# Patient Record
Sex: Male | Born: 1935
Health system: Southern US, Community
[De-identification: ages and names within clinical notes are randomized; demographics above are authoritative.]

## PROBLEM LIST (undated history)

## (undated) DIAGNOSIS — E119 Type 2 diabetes mellitus without complications: Secondary | ICD-10-CM

## (undated) DIAGNOSIS — Z973 Presence of spectacles and contact lenses: Secondary | ICD-10-CM

## (undated) DIAGNOSIS — Z978 Presence of other specified devices: Secondary | ICD-10-CM

## (undated) DIAGNOSIS — Z8739 Personal history of other diseases of the musculoskeletal system and connective tissue: Secondary | ICD-10-CM

## (undated) DIAGNOSIS — I10 Essential (primary) hypertension: Secondary | ICD-10-CM

## (undated) DIAGNOSIS — R339 Retention of urine, unspecified: Secondary | ICD-10-CM

## (undated) DIAGNOSIS — Z972 Presence of dental prosthetic device (complete) (partial): Secondary | ICD-10-CM

## (undated) DIAGNOSIS — N400 Enlarged prostate without lower urinary tract symptoms: Secondary | ICD-10-CM

## (undated) DIAGNOSIS — E785 Hyperlipidemia, unspecified: Secondary | ICD-10-CM

## (undated) DIAGNOSIS — Z960 Presence of urogenital implants: Secondary | ICD-10-CM

## (undated) DIAGNOSIS — K219 Gastro-esophageal reflux disease without esophagitis: Secondary | ICD-10-CM

## (undated) DIAGNOSIS — E559 Vitamin D deficiency, unspecified: Secondary | ICD-10-CM

## (undated) DIAGNOSIS — J302 Other seasonal allergic rhinitis: Secondary | ICD-10-CM

## (undated) HISTORY — DX: Benign prostatic hyperplasia without lower urinary tract symptoms: N40.0

## (undated) HISTORY — DX: Essential (primary) hypertension: I10

## (undated) HISTORY — PX: TONSILLECTOMY: SUR1361

## (undated) HISTORY — DX: Hyperlipidemia, unspecified: E78.5

## (undated) HISTORY — DX: Vitamin D deficiency, unspecified: E55.9

---

## 1991-02-12 HISTORY — PX: ANTERIOR CERVICAL DECOMP/DISCECTOMY FUSION: SHX1161

## 2004-02-02 ENCOUNTER — Ambulatory Visit: Payer: Self-pay | Admitting: Family Medicine

## 2004-03-15 ENCOUNTER — Ambulatory Visit: Payer: Self-pay | Admitting: Family Medicine

## 2004-05-21 ENCOUNTER — Ambulatory Visit: Payer: Self-pay | Admitting: Family Medicine

## 2004-06-07 ENCOUNTER — Ambulatory Visit: Payer: Self-pay | Admitting: Family Medicine

## 2005-01-24 ENCOUNTER — Ambulatory Visit: Payer: Self-pay | Admitting: Family Medicine

## 2005-04-22 ENCOUNTER — Ambulatory Visit: Payer: Self-pay | Admitting: Family Medicine

## 2005-05-22 ENCOUNTER — Ambulatory Visit: Payer: Self-pay | Admitting: Family Medicine

## 2005-05-27 ENCOUNTER — Ambulatory Visit: Payer: Self-pay | Admitting: Family Medicine

## 2005-11-13 ENCOUNTER — Ambulatory Visit: Payer: Self-pay | Admitting: Family Medicine

## 2006-05-22 ENCOUNTER — Ambulatory Visit: Payer: Self-pay | Admitting: Family Medicine

## 2012-03-20 ENCOUNTER — Ambulatory Visit (INDEPENDENT_AMBULATORY_CARE_PROVIDER_SITE_OTHER): Payer: Medicare PPO | Admitting: Urology

## 2012-03-20 DIAGNOSIS — N401 Enlarged prostate with lower urinary tract symptoms: Secondary | ICD-10-CM

## 2012-03-20 DIAGNOSIS — R81 Glycosuria: Secondary | ICD-10-CM

## 2012-03-20 DIAGNOSIS — N3941 Urge incontinence: Secondary | ICD-10-CM

## 2012-04-28 ENCOUNTER — Other Ambulatory Visit: Payer: Self-pay | Admitting: *Deleted

## 2012-04-28 MED ORDER — AMLODIPINE BESYLATE 10 MG PO TABS: 10.0000 mg | ORAL_TABLET | Freq: Every day | ORAL | Status: DC

## 2012-06-15 ENCOUNTER — Ambulatory Visit (INDEPENDENT_AMBULATORY_CARE_PROVIDER_SITE_OTHER): Payer: Medicare PPO | Admitting: Family Medicine

## 2012-06-15 ENCOUNTER — Encounter: Payer: Self-pay | Admitting: Family Medicine

## 2012-06-15 VITALS — BP 157/64 | HR 81 | Temp 97.0°F | Ht 67.0 in | Wt 207.2 lb

## 2012-06-15 DIAGNOSIS — M21169 Varus deformity, not elsewhere classified, unspecified knee: Secondary | ICD-10-CM | POA: Insufficient documentation

## 2012-06-15 DIAGNOSIS — R739 Hyperglycemia, unspecified: Secondary | ICD-10-CM | POA: Insufficient documentation

## 2012-06-15 DIAGNOSIS — M7989 Other specified soft tissue disorders: Secondary | ICD-10-CM

## 2012-06-15 DIAGNOSIS — R7309 Other abnormal glucose: Secondary | ICD-10-CM

## 2012-06-15 DIAGNOSIS — I10 Essential (primary) hypertension: Secondary | ICD-10-CM | POA: Insufficient documentation

## 2012-06-15 DIAGNOSIS — E785 Hyperlipidemia, unspecified: Secondary | ICD-10-CM | POA: Insufficient documentation

## 2012-06-15 DIAGNOSIS — B0229 Other postherpetic nervous system involvement: Secondary | ICD-10-CM | POA: Insufficient documentation

## 2012-06-15 DIAGNOSIS — N400 Enlarged prostate without lower urinary tract symptoms: Secondary | ICD-10-CM

## 2012-06-15 LAB — COMPLETE METABOLIC PANEL WITH GFR
ALT: 22 U/L (ref 0–53)
AST: 20 U/L (ref 0–37)
Albumin: 4.5 g/dL (ref 3.5–5.2)
Alkaline Phosphatase: 74 U/L (ref 39–117)
BUN: 12 mg/dL (ref 6–23)
CO2: 28 mEq/L (ref 19–32)
Calcium: 9.2 mg/dL (ref 8.4–10.5)
Chloride: 100 mEq/L (ref 96–112)
Creat: 1.05 mg/dL (ref 0.50–1.35)
GFR, Est African American: 80 mL/min
GFR, Est Non African American: 69 mL/min
Glucose, Bld: 133 mg/dL — ABNORMAL HIGH (ref 70–99)
Potassium: 4.5 mEq/L (ref 3.5–5.3)
Sodium: 137 mEq/L (ref 135–145)
Total Bilirubin: 0.5 mg/dL (ref 0.3–1.2)
Total Protein: 7 g/dL (ref 6.0–8.3)

## 2012-06-15 LAB — POCT GLYCOSYLATED HEMOGLOBIN (HGB A1C): Hemoglobin A1C: 5.6

## 2012-06-15 NOTE — Progress Notes (Signed)
Subjective:    Patient ID: Terry Winters, male    DOB: [birth year], 78 y.o.   MRN: [id number]  HPI Came establish for care. Has a painful knot in the distal thigh and swelling and discomfort right leg. Other problems include hyperglycemia, hyperlipidemia, and Hypertension.  denies Headache;denies Chest Pain;denies weakness;denies Shortness of Breath and orthopnea;denies Visual changes;denies palpitations;denies cough; has had right leg pedal edema;denies symptoms of TIA or stroke;deniesClaudication symptoms. admits to Compliance with medications; denies Problems with medications.  No past medical history on file. No past surgical history on file. History   Social History  . Marital Status: Married    Spouse Name: N/A    Number of Children: N/A  . Years of Education: N/A   Occupational History  . Not on file.   Social History Main Topics  . Smoking status: Former Smoker    Types: Cigars    Quit date: 06/15/1972  . Smokeless tobacco: Not on file  . Alcohol Use: Not on file  . Drug Use: Not on file  . Sexually Active: Not on file   Other Topics Concern  . Not on file   Social History Narrative  . No narrative on file   No family history on file. Current Outpatient Prescriptions on File Prior to Visit  Medication Sig Dispense Refill  . amLODipine (NORVASC) 10 MG tablet Take 1 tablet (10 mg total) by mouth daily.  30 tablet  1   No current facility-administered medications on file prior to visit.   Allergies  Allergen Reactions  . Sulfa Antibiotics   . Prednisone Rash    There is no immunization history on file for this patient. Prior to Admission medications   Medication Sig Start Date End Date Taking? Authorizing Provider  amLODipine (NORVASC) 10 MG tablet Take 1 tablet (10 mg total) by mouth daily. 04/28/12  Yes Ileana Ladd, MD  aspirin 81 MG tablet Take 81 mg by mouth daily.   Yes Historical Provider, MD  atorvastatin (LIPITOR) 20 MG tablet  05/18/12  Yes  Historical Provider, MD  cholecalciferol (VITAMIN D) 1000 UNITS tablet Take 2,000 Units by mouth daily.   Yes Historical Provider, MD  zolpidem (AMBIEN) 10 MG tablet  03/28/12  Yes Historical Provider, MD     Review of Systems  Constitutional: Negative.   HENT: Positive for facial swelling.   Eyes: Negative.   Respiratory: Negative.   Cardiovascular: Positive for leg swelling (rt leg ?edema knot on leg no injury).  Gastrointestinal: Negative.   Endocrine: Negative.   Genitourinary: Negative.   Musculoskeletal: Negative.   Neurological: Negative.   Hematological: Negative.   Psychiatric/Behavioral: Negative.        Objective:   Physical Exam APPEARANCE:  Obese WM Patient in no acute distress.The patient appeared well nourished and normally developed. Acyanotic. Waist: VITAL SIGNS:BP 157/64  Pulse 81  Temp(Src) 97 F (36.1 C) (Oral)  Ht 5\' 7"  (1.702 m)  Wt 207 lb 3.2 oz (93.985 kg)  BMI 32.44 kg/m2   SKIN: warm and  Dry without overt rashes, tattoos and scars  HEAD and Neck: without JVD, Head and scalp: normal Eyes:No scleral icterus. Fundi normal, eye movements normal. Ears: Auricle normal, canal normal, Tympanic membranes normal, insufflation normal. Nose: normal Throat: normal Neck & thyroid: normal  CHEST & LUNGS: Chest wall: normal Lungs: Clear  CVS: Reveals the PMI to be normally located. Regular rhythm, First and Second Heart sounds are normal,  absence of murmurs, rubs or gallops. Peripheral vasculature: Radial  pulses: normal Dorsal pedis pulses: normal Posterior pulses: normal  ABDOMEN:  Appearance: normal Benign,, no organomegaly, no masses, no Abdominal Aortic enlargement. No Guarding , no rebound. No Bruits. Bowel sounds: normal  RECTAL: N/A GU: N/A  EXTREMETIES: edematous right leg. Homan's negative Both Femoral and Pedal pulses are normal.  MUSCULOSKELETAL:  Spine: normal Joints: intact Right lateral distal thigh has a mildly  tender cystic swelling.  NEUROLOGIC: oriented to time,place and person; nonfocal. Strength is normal      Assessment & Plan:  HTN (hypertension) - Plan: COMPLETE METABOLIC PANEL WITH GFR  HLD (hyperlipidemia) - Plan: COMPLETE METABOLIC PANEL WITH GFR, NMR Lipoprofile with Lipids  Hyperglycemia - Plan: POCT glycosylated hemoglobin (Hb A1C), COMPLETE METABOLIC PANEL WITH GFR  BPH (benign prostatic hyperplasia)  Postherpetic neuralgia  Leg swelling - Plan: Vas Lab Arterial/Venous  Plan: Orders Placed This Encounter  Procedures  . COMPLETE METABOLIC PANEL WITH GFR  . NMR Lipoprofile with Lipids  . POCT glycosylated hemoglobin (Hb A1C)  . Vas Lab Arterial/Venous    Standing Status: Future     Number of Occurrences:      Standing Expiration Date: 06/15/2013    Scheduling Instructions:     Doppler venous of the right leg. Pain and  Swelling; R/O DVT. To be  Done  Today at [facility].    Order Specific Question:  Where should this test be performed?    Answer:  Other   Meds ordered this encounter  Medications  . atorvastatin (LIPITOR) 20 MG tablet    Sig:   . zolpidem (AMBIEN) 10 MG tablet    Sig:   . aspirin 81 MG tablet    Sig: Take 81 mg by mouth daily.  . cholecalciferol (VITAMIN D) 1000 UNITS tablet    Sig: Take 2,000 Units by mouth daily.    Await labs. Await the US of the right leg       Dr Woodroe Mode Recommendations  Diet and Exercise discussed with patient.  For nutrition information, I recommend books:  1).Eat to Live by Dr Monico Hoar. 2).Prevent and Reverse Heart Disease by Dr Suzzette Righter.  Exercise recommendations are:  If unable to walk, then the patient can exercise in a chair 3 times a day. By flapping arms like a bird gently and raising legs outwards to the front.  If ambulatory, the patient can go for walks for 30 minutes 3 times a week. Then increase the intensity and duration as tolerated.  Goal is to try to attain  exercise frequency to 5 times a week.  If applicable: Best to perform resistance exercises (machines or weights) 2 days a week and cardio type exercises 3 days per week. RTc 4 weeks to check the BP  Francis P. Modesto Charon, M.D.

## 2012-06-15 NOTE — Patient Instructions (Addendum)
      Dr Newman Waren's Recommendations  Diet and Exercise discussed with patient.  For nutrition information, I recommend books:  1).Eat to Live by Dr Joel Fuhrman. 2).Prevent and Reverse Heart Disease by Dr Caldwell Esselstyn.  Exercise recommendations are:  If unable to walk, then the patient can exercise in a chair 3 times a day. By flapping arms like a bird gently and raising legs outwards to the front.  If ambulatory, the patient can go for walks for 30 minutes 3 times a week. Then increase the intensity and duration as tolerated.  Goal is to try to attain exercise frequency to 5 times a week.  If applicable: Best to perform resistance exercises (machines or weights) 2 days a week and cardio type exercises 3 days per week.  

## 2012-06-17 LAB — NMR LIPOPROFILE WITH LIPIDS
Cholesterol, Total: 156 mg/dL (ref <200)
HDL Particle Number: 31.8 umol/L (ref >=30.5)
HDL Size: 8.8 nm — ABNORMAL LOW (ref >=9.2)
HDL-C: 49 mg/dL (ref >=40)
LDL (calc): 73 mg/dL (ref <100)
LDL Particle Number: 1383 nmol/L — ABNORMAL HIGH (ref <1000)
LDL Size: 20.3 nm — ABNORMAL LOW (ref >20.5)
LP-IR Score: 73 — ABNORMAL HIGH (ref <=45)
Large HDL-P: 4.6 umol/L — ABNORMAL LOW (ref >=4.8)
Large VLDL-P: 5.4 nmol/L — ABNORMAL HIGH (ref <=2.7)
Small LDL Particle Number: 768 nmol/L — ABNORMAL HIGH (ref <=527)
Triglycerides: 171 mg/dL — ABNORMAL HIGH (ref <150)
VLDL Size: 56.2 nm — ABNORMAL HIGH (ref <=46.6)

## 2012-06-19 ENCOUNTER — Ambulatory Visit: Payer: Medicare PPO | Admitting: Urology

## 2012-07-16 ENCOUNTER — Encounter: Payer: Self-pay | Admitting: Family Medicine

## 2012-07-16 ENCOUNTER — Ambulatory Visit (INDEPENDENT_AMBULATORY_CARE_PROVIDER_SITE_OTHER): Payer: Medicare PPO | Admitting: Family Medicine

## 2012-07-16 VITALS — BP 158/81 | HR 74 | Temp 97.1°F | Wt 200.8 lb

## 2012-07-16 DIAGNOSIS — Z862 Personal history of diseases of the blood and blood-forming organs and certain disorders involving the immune mechanism: Secondary | ICD-10-CM

## 2012-07-16 DIAGNOSIS — E785 Hyperlipidemia, unspecified: Secondary | ICD-10-CM

## 2012-07-16 DIAGNOSIS — Z8739 Personal history of other diseases of the musculoskeletal system and connective tissue: Secondary | ICD-10-CM | POA: Insufficient documentation

## 2012-07-16 DIAGNOSIS — E663 Overweight: Secondary | ICD-10-CM | POA: Insufficient documentation

## 2012-07-16 DIAGNOSIS — N400 Enlarged prostate without lower urinary tract symptoms: Secondary | ICD-10-CM

## 2012-07-16 DIAGNOSIS — Z8639 Personal history of other endocrine, nutritional and metabolic disease: Secondary | ICD-10-CM

## 2012-07-16 DIAGNOSIS — B0229 Other postherpetic nervous system involvement: Secondary | ICD-10-CM | POA: Insufficient documentation

## 2012-07-16 DIAGNOSIS — I10 Essential (primary) hypertension: Secondary | ICD-10-CM

## 2012-07-16 DIAGNOSIS — L80 Vitiligo: Secondary | ICD-10-CM | POA: Insufficient documentation

## 2012-07-16 MED ORDER — LOSARTAN POTASSIUM 100 MG PO TABS: 100.0000 mg | ORAL_TABLET | Freq: Every day | ORAL | Status: DC

## 2012-07-16 NOTE — Progress Notes (Signed)
Patient ID: Terry Winters, male   DOB: 12/06/1934, 78 y.o.   MRN: [id number] SUBJECTIVE:   HPI:   PMH/PSH: reviewed/updated in Epic  SH/FH: reviewed/updated in Epic  Allergies: reviewed/updated in Epic  Medications: reviewed/updated in Epic  Immunizations: reviewed/updated in Epic  ROS: As above in the HPI. All other systems are stable or negative.  OBJECTIVE: APPEARANCE:  Patient in no acute distress.The patient appeared well nourished and normally developed. Acyanotic. Waist: VITAL SIGNS:BP 158/81  Pulse 74  Temp(Src) 97.1 F (36.2 C) (Oral)  Wt 200 lb 12.8 oz (91.082 kg)  BMI 31.44 kg/m2 WM Obese  SKIN: warm and  Dry without overt rashes, tattoos and scars. Marked vitiligo widespread on the extremities.  HEAD and Neck: without JVD, Head and scalp: normal Eyes:No scleral icterus. Fundi normal, eye movements normal. Ears: Auricle normal, canal normal, Tympanic membranes normal, insufflation normal. Nose: normal Throat: normal Neck & thyroid: normal  CHEST & LUNGS: Chest wall: normal Lungs: Clear  CVS: Reveals the PMI to be normally located. Regular rhythm, First and Second Heart sounds are normal,  absence of murmurs, rubs or gallops. Peripheral vasculature: Radial pulses: normal Dorsal pedis pulses: normal  ABDOMEN:  Appearance: Obese Benign, no organomegaly, no masses, no Abdominal Aortic enlargement. No Guarding , no rebound. No Bruits. Bowel sounds: normal  RECTAL: N/A GU: N/A  EXTREMETIES: nonedematous. Both Pedal pulses are normal.  MUSCULOSKELETAL:  Spine: normal Joints: intact  NEUROLOGIC: oriented to time,place and person; nonfocal. Strength is normal Sensory is normal   Results for orders placed in visit on 06/15/12  COMPLETE METABOLIC PANEL WITH GFR      Result Value Range   Sodium 137  135 - 145 mEq/L   Potassium 4.5  3.5 - 5.3 mEq/L   Chloride 100  96 - 112 mEq/L   CO2 28  19 - 32 mEq/L   Glucose, Bld 133 (*) 70 - 99 mg/dL    BUN 12  6 - 23 mg/dL   Creat 2.95  6.21 - 3.08 mg/dL   Total Bilirubin 0.5  0.3 - 1.2 mg/dL   Alkaline Phosphatase 74  39 - 117 U/L   AST 20  0 - 37 U/L   ALT 22  0 - 53 U/L   Total Protein 7.0  6.0 - 8.3 g/dL   Albumin 4.5  3.5 - 5.2 g/dL   Calcium 9.2  8.4 - 65.7 mg/dL   GFR, Est African American 80     GFR, Est Non African American 69    NMR LIPOPROFILE WITH LIPIDS      Result Value Range   LDL Particle Number [id number] (*) <1000 nmol/L   LDL (calc) 73  <100 mg/dL   HDL-C 49  >=84 mg/dL   Triglycerides 696 (*) <150 mg/dL   Cholesterol, Total 295  <200 mg/dL   HDL Particle Number 28.4  >=13.2 umol/L   Large HDL-P 4.6 (*) >=4.8 umol/L   Large VLDL-P 5.4 (*) <=2.7 nmol/L   Small LDL Particle Number 768 (*) <=527 nmol/L   LDL Size 20.3 (*) >20.5 nm   HDL Size 8.8 (*) >=9.2 nm   VLDL Size 56.2 (*) <=46.6 nm   LP-IR Score 73 (*) <=45  POCT GLYCOSYLATED HEMOGLOBIN (HGB A1C)      Result Value Range   Hemoglobin A1C 5.6%    U/S result from [facility] was negative for DVT.   ASSESSMENT: HTN (hypertension) - Plan: losartan (COZAAR) 100 MG tablet  HLD (hyperlipidemia)  Overweight  Vitiligo  Post herpetic neuralgia  BPH (benign prostatic hyperplasia)  H/O: gout Reviewed labs with patient  PLAN: No orders of the defined types were placed in this encounter.   Meds ordered this encounter  Medications  . fish oil-omega-3 fatty acids 1000 MG capsule    Sig: Take 3 g by mouth daily.  Marland Kitchen losartan (COZAAR) 100 MG tablet    Sig: Take 1 tablet (100 mg total) by mouth daily.    Dispense:  30 tablet    Refill:  3       Dr Woodroe Mode Recommendations  Diet and Exercise discussed with patient.  For nutrition information, I recommend books:  1).Eat to Live by Dr Monico Hoar. 2).Prevent and Reverse Heart Disease by Dr Suzzette Righter. 3) Dr Katherina Right Book: Reversing Diabetes  Exercise recommendations are:  If unable to walk, then the patient can exercise  in a chair 3 times a day. By flapping arms like a bird gently and raising legs outwards to the front.  If ambulatory, the patient can go for walks for 30 minutes 3 times a week. Then increase the intensity and duration as tolerated.  Goal is to try to attain exercise frequency to 5 times a week.  If applicable: Best to perform resistance exercises (machines or weights) 2 days a week and cardio type exercises 3 days per week.   Discussed healthy diet and weight reduction to help with BP control.  Return in about 2 months (around 09/15/2012) for Recheck medical problems. To recheck BP and labs. Patient to monitor BP regularly.  Jametta Moorehead P. Modesto Charon, M.D.

## 2012-07-16 NOTE — Patient Instructions (Signed)
      Dr Kyrstyn Greear's Recommendations  Diet and Exercise discussed with patient.  For nutrition information, I recommend books:  1).Eat to Live by Dr Joel Fuhrman. 2).Prevent and Reverse Heart Disease by Dr Caldwell Esselstyn. 3) Dr Neal Barnard's Book: Reversing Diabetes  Exercise recommendations are:  If unable to walk, then the patient can exercise in a chair 3 times a day. By flapping arms like a bird gently and raising legs outwards to the front.  If ambulatory, the patient can go for walks for 30 minutes 3 times a week. Then increase the intensity and duration as tolerated.  Goal is to try to attain exercise frequency to 5 times a week.  If applicable: Best to perform resistance exercises (machines or weights) 2 days a week and cardio type exercises 3 days per week.  

## 2012-07-24 ENCOUNTER — Encounter: Payer: Self-pay | Admitting: Family Medicine

## 2012-09-08 ENCOUNTER — Encounter: Payer: Self-pay | Admitting: General Practice

## 2012-09-08 ENCOUNTER — Other Ambulatory Visit: Payer: Self-pay | Admitting: Family Medicine

## 2012-09-08 ENCOUNTER — Ambulatory Visit (INDEPENDENT_AMBULATORY_CARE_PROVIDER_SITE_OTHER): Payer: Medicare PPO | Admitting: General Practice

## 2012-09-08 VITALS — BP 148/72 | HR 64 | Temp 96.7°F | Ht 66.5 in | Wt 202.0 lb

## 2012-09-08 DIAGNOSIS — B029 Zoster without complications: Secondary | ICD-10-CM

## 2012-09-08 MED ORDER — VALACYCLOVIR HCL 1 G PO TABS: 1000.0000 mg | ORAL_TABLET | Freq: Three times a day (TID) | ORAL | Status: DC

## 2012-09-08 NOTE — Progress Notes (Signed)
  Subjective:    Patient ID: Terry Winters, male    DOB: 1934/05/08, 78 y.o.   MRN: [id number]  HPI Patient present today with complaints of itchy, burning rash to left lateral chest. He reports noticing this rash on Saturday and gradually worsen. He reports taking neurontin in the past for pain and stopped taking due to constipation. He denies taking OTC medication.     Review of Systems  Constitutional: Negative for fever and chills.  Respiratory: Negative for chest tightness and shortness of breath.   Cardiovascular: Negative for chest pain and palpitations.  Genitourinary: Negative for difficulty urinating.  Skin: Positive for rash.       Red rash to left outer chest  Neurological: Negative for dizziness, weakness and headaches.       Objective:   Physical Exam  Constitutional: He is oriented to person, place, and time. He appears well-developed and well-nourished.  Cardiovascular: Normal rate, regular rhythm and normal heart sounds.   Pulmonary/Chest: Effort normal and breath sounds normal. No respiratory distress. He exhibits no tenderness.  Neurological: He is alert and oriented to person, place, and time.  Skin: Skin is warm and dry. Rash noted. There is erythema.  Two Linear (1/2 inch x 1/4) Maculopapular rashed area with erythematous base. Negative drainage   Psychiatric: He has a normal mood and affect.          Assessment & Plan:  1. Shingles - valACYclovir (VALTREX) 1000 MG tablet; Take 1 tablet (1,000 mg total) by mouth every 8 (eight) hours.  Dispense: 21 tablet; Refill: 0 -keep skin clean and dry -refrain from contact with high risk persons as discussed -cool compresses to soothe itching area -RTO if symptoms worsens or unresolved -Patient verbalized understanding -Coralie Keens, FNP-C

## 2012-09-08 NOTE — Patient Instructions (Addendum)
Shingles Shingles (herpes zoster) is an infection that is caused by the same virus that causes chickenpox (varicella). The infection causes a painful skin rash and fluid-filled blisters, which eventually break open, crust over, and heal. It may occur in any area of the body, but it usually affects only one side of the body or face. The pain of shingles usually lasts about 1 month. However, some people with shingles may develop long-term (chronic) pain in the affected area of the body. Shingles often occurs many years after the person had chickenpox. It is more common:  In people older than 50 years.  In people with weakened immune systems, such as those with HIV, AIDS, or cancer.  In people taking medicines that weaken the immune system, such as transplant medicines.  In people under great stress. CAUSES  Shingles is caused by the varicella zoster virus (VZV), which also causes chickenpox. After a person is infected with the virus, it can remain in the person's body for years in an inactive state (dormant). To cause shingles, the virus reactivates and breaks out as an infection in a nerve root. The virus can be spread from person to person (contagious) through contact with open blisters of the shingles rash. It will only spread to people who have not had chickenpox. When these people are exposed to the virus, they may develop chickenpox. They will not develop shingles. Once the blisters scab over, the person is no longer contagious and cannot spread the virus to others. SYMPTOMS  Shingles shows up in stages. The initial symptoms may be pain, itching, and tingling in an area of the skin. This pain is usually described as burning, stabbing, or throbbing.In a few days or weeks, a painful red rash will appear in the area where the pain, itching, and tingling were felt. The rash is usually on one side of the body in a band or belt-like pattern. Then, the rash usually turns into fluid-filled blisters. They  will scab over and dry up in approximately 2 3 weeks. Flu-like symptoms may also occur with the initial symptoms, the rash, or the blisters. These may include:  Fever.  Chills.  Headache.  Upset stomach. DIAGNOSIS  Your caregiver will perform a skin exam to diagnose shingles. Skin scrapings or fluid samples may also be taken from the blisters. This sample will be examined under a microscope or sent to a lab for further testing. TREATMENT  There is no specific cure for shingles. Your caregiver will likely prescribe medicines to help you manage the pain, recover faster, and avoid long-term problems. This may include antiviral drugs, anti-inflammatory drugs, and pain medicines. HOME CARE INSTRUCTIONS   Take a cool bath or apply cool compresses to the area of the rash or blisters as directed. This may help with the pain and itching.   Only take over-the-counter or prescription medicines as directed by your caregiver.   Rest as directed by your caregiver.  Keep your rash and blisters clean with mild soap and cool water or as directed by your caregiver.  Do not pick your blisters or scratch your rash. Apply an anti-itch cream or numbing creams to the affected area as directed by your caregiver.  Keep your shingles rash covered with a loose bandage (dressing).  Avoid skin contact with:  Babies.   Pregnant women.   Children with eczema.   Elderly people with transplants.   People with chronic illnesses, such as leukemia or AIDS.   Wear loose-fitting clothing to help ease   the pain of material rubbing against the rash.  Keep all follow-up appointments with your caregiver.If the area involved is on your face, you may receive a referral for follow-up to a specialist, such as an eye doctor (ophthalmologist) or an ear, nose, and throat (ENT) doctor. Keeping all follow-up appointments will help you avoid eye complications, chronic pain, or disability.  SEEK IMMEDIATE MEDICAL  CARE IF:   You have facial pain, pain around the eye area, or loss of feeling on one side of your face.  You have ear pain or ringing in your ear.  You have loss of taste.  Your pain is not relieved with prescribed medicines.   Your redness or swelling spreads.   You have more pain and swelling.  Your condition is worsening or has changed.   You have a feveror persistent symptoms for more than 2 3 days.  You have a fever and your symptoms suddenly get worse. MAKE SURE YOU:  Understand these instructions.  Will watch your condition.  Will get help right away if you are not doing well or get worse. Document Released: 01/28/2005 Document Revised: 10/23/2011 Document Reviewed: 09/12/2011 [employer] Patient Information 2014 [employer].  

## 2012-09-15 ENCOUNTER — Ambulatory Visit: Payer: Medicare PPO | Admitting: Family Medicine

## 2012-09-28 ENCOUNTER — Other Ambulatory Visit: Payer: Self-pay | Admitting: *Deleted

## 2012-09-28 DIAGNOSIS — B029 Zoster without complications: Secondary | ICD-10-CM

## 2012-10-15 ENCOUNTER — Ambulatory Visit (INDEPENDENT_AMBULATORY_CARE_PROVIDER_SITE_OTHER): Payer: Medicare PPO | Admitting: Family Medicine

## 2012-10-15 ENCOUNTER — Encounter: Payer: Self-pay | Admitting: Family Medicine

## 2012-10-15 VITALS — BP 164/81 | HR 63 | Temp 97.0°F | Wt 204.2 lb

## 2012-10-15 DIAGNOSIS — M109 Gout, unspecified: Secondary | ICD-10-CM | POA: Insufficient documentation

## 2012-10-15 DIAGNOSIS — M21169 Varus deformity, not elsewhere classified, unspecified knee: Secondary | ICD-10-CM

## 2012-10-15 DIAGNOSIS — R739 Hyperglycemia, unspecified: Secondary | ICD-10-CM

## 2012-10-15 DIAGNOSIS — E663 Overweight: Secondary | ICD-10-CM

## 2012-10-15 DIAGNOSIS — I10 Essential (primary) hypertension: Secondary | ICD-10-CM

## 2012-10-15 DIAGNOSIS — E559 Vitamin D deficiency, unspecified: Secondary | ICD-10-CM | POA: Insufficient documentation

## 2012-10-15 DIAGNOSIS — B0229 Other postherpetic nervous system involvement: Secondary | ICD-10-CM

## 2012-10-15 DIAGNOSIS — N529 Male erectile dysfunction, unspecified: Secondary | ICD-10-CM | POA: Insufficient documentation

## 2012-10-15 DIAGNOSIS — L80 Vitiligo: Secondary | ICD-10-CM

## 2012-10-15 DIAGNOSIS — R7309 Other abnormal glucose: Secondary | ICD-10-CM

## 2012-10-15 DIAGNOSIS — N400 Enlarged prostate without lower urinary tract symptoms: Secondary | ICD-10-CM

## 2012-10-15 DIAGNOSIS — E785 Hyperlipidemia, unspecified: Secondary | ICD-10-CM

## 2012-10-15 LAB — POCT GLYCOSYLATED HEMOGLOBIN (HGB A1C): Hemoglobin A1C: 5.7

## 2012-10-15 MED ORDER — SILDENAFIL CITRATE 20 MG PO TABS: 100.0000 mg | ORAL_TABLET | Freq: Every day | ORAL | Status: DC

## 2012-10-15 NOTE — Progress Notes (Signed)
Patient ID: Terry Winters, male   DOB: 06/20/1934, 78 y.o.   MRN: [id number] SUBJECTIVE: CC: Chief Complaint  Patient presents with  . Follow-up    2 month refill losartan    HPI: Patient is here for follow up of hyperlipidemia/htn/hyperglycemia: denies Headache;denies Chest Pain;denies weakness;denies Shortness of Breath and orthopnea;denies Visual changes;denies palpitations;denies cough;denies pedal edema;denies symptoms of TIA or stroke;deniesClaudication symptoms. admits to Compliance with medications; denies Problems with medications.  Past Medical History  Diagnosis Date  . Hypertension   . Hyperlipidemia    Past Surgical History  Procedure Laterality Date  . Neck surgery     History   Social History  . Marital Status: Married    Spouse Name: N/A    Number of Children: N/A  . Years of Education: N/A   Occupational History  . Not on file.   Social History Main Topics  . Smoking status: Former Smoker    Types: Cigars    Quit date: 06/15/1972  . Smokeless tobacco: Not on file  . Alcohol Use: Not on file  . Drug Use: Not on file  . Sexual Activity: Not on file   Other Topics Concern  . Not on file   Social History Narrative  . No narrative on file   Family History  Problem Relation Age of Onset  . ALS Father    Current Outpatient Prescriptions on File Prior to Visit  Medication Sig Dispense Refill  . aspirin 81 MG tablet Take 81 mg by mouth daily.      Marland Kitchen atorvastatin (LIPITOR) 20 MG tablet TAKE 1 TABLET AT BEDTIME  90 tablet  0  . cholecalciferol (VITAMIN D) 1000 UNITS tablet Take 2,000 Units by mouth daily.      . fish oil-omega-3 fatty acids 1000 MG capsule Take 3 g by mouth daily.      Marland Kitchen losartan (COZAAR) 100 MG tablet Take 1 tablet (100 mg total) by mouth daily.  30 tablet  3  . zolpidem (AMBIEN) 10 MG tablet Take 10 mg by mouth at bedtime as needed.        No current facility-administered medications on file prior to visit.   Allergies  Allergen  Reactions  . Sulfa Antibiotics   . Prednisone Rash    There is no immunization history on file for this patient. Prior to Admission medications   Medication Sig Start Date End Date Taking? Authorizing Provider  aspirin 81 MG tablet Take 81 mg by mouth daily.   Yes Historical Provider, MD  atorvastatin (LIPITOR) 20 MG tablet TAKE 1 TABLET AT BEDTIME 09/08/12  Yes Ileana Ladd, MD  cholecalciferol (VITAMIN D) 1000 UNITS tablet Take 2,000 Units by mouth daily.   Yes Historical Provider, MD  fish oil-omega-3 fatty acids 1000 MG capsule Take 3 g by mouth daily.   Yes Historical Provider, MD  losartan (COZAAR) 100 MG tablet Take 1 tablet (100 mg total) by mouth daily. 07/16/12  Yes Ileana Ladd, MD  zolpidem (AMBIEN) 10 MG tablet Take 10 mg by mouth at bedtime as needed.  03/28/12  Yes Historical Provider, MD     ROS: As above in the HPI. All other systems are stable or negative.  OBJECTIVE: APPEARANCE:  Patient in no acute distress.The patient appeared well nourished and normally developed. Acyanotic. Waist: VITAL SIGNS:BP 164/81  Pulse 63  Temp(Src) 97 F (36.1 C) (Oral)  Wt 204 lb 3.2 oz (92.625 kg)  BMI 32.47 kg/m2  Recheck 122/75  WM Obese  SKIN: warm and  Dry without overt rashes, tattoos and scars  HEAD and Neck: without JVD, Head and scalp: normal Eyes:No scleral icterus. Fundi normal, eye movements normal. Ears: Auricle normal, canal normal, Tympanic membranes normal, insufflation normal. Nose: normal Throat: normal Neck & thyroid: normal  CHEST & LUNGS: Chest wall: normal Lungs: Clear  CVS: Reveals the PMI to be normally located. Regular rhythm, First and Second Heart sounds are normal,  absence of murmurs, rubs or gallops. Peripheral vasculature: Radial pulses: normal Dorsal pedis pulses: normal Posterior pulses: normal  ABDOMEN:  Appearance: normal Benign, no organomegaly, no masses, no Abdominal Aortic enlargement. No Guarding , no rebound. No  Bruits. Bowel sounds: normal  RECTAL: N/A GU: N/A  EXTREMETIES: nonedematous.  MUSCULOSKELETAL:  Spine: normal Joints: intact  NEUROLOGIC: oriented to time,place and person; nonfocal. Strength is normal Sensory is normal Reflexes are normal Cranial Nerves are normal.  ASSESSMENT: Gout  HTN (hypertension) - Plan: CMP14+EGFR  HLD (hyperlipidemia) - Plan: CMP14+EGFR, NMR, lipoprofile  Hyperglycemia - Plan: POCT glycosylated hemoglobin (Hb A1C)  Post herpetic neuralgia  Overweight  Genu varus, unspecified laterality  BPH (benign prostatic hyperplasia)  Erectile dysfunction - Plan: sildenafil (REVATIO) 20 MG tablet  Vitamin D deficiency - Plan: Vit D  25 hydroxy (rtn osteoporosis monitoring)  Vitiligo   PLAN: Plans to change urologist due to insurance.  Orders Placed This Encounter  Procedures  . CMP14+EGFR  . NMR, lipoprofile  . Vit D  25 hydroxy (rtn osteoporosis monitoring)  . POCT glycosylated hemoglobin (Hb A1C)    Meds ordered this encounter  Medications  . DISCONTD: vardenafil (LEVITRA) 20 MG tablet    Sig: Take 20 mg by mouth daily as needed for erectile dysfunction.  . sildenafil (REVATIO) 20 MG tablet    Sig: Take 5 tablets (100 mg total) by mouth daily. As directed prn prior to coitus.    Dispense:  150 tablet    Refill:  3   Diet weight reduction, risk reduction discussed.  Return in about 3 months (around 01/14/2013) for Recheck medical problems.  Adalin Vanderploeg P. Modesto Charon, M.D.

## 2012-10-16 LAB — CMP14+EGFR
ALT: 29 IU/L (ref 0–44)
AST: 26 IU/L (ref 0–40)
Albumin/Globulin Ratio: 2.1 (ref 1.1–2.5)
Albumin: 4.6 g/dL (ref 3.5–4.8)
Alkaline Phosphatase: 66 IU/L (ref 39–117)
BUN/Creatinine Ratio: 12 (ref 10–22)
BUN: 12 mg/dL (ref 8–27)
CO2: 24 mmol/L (ref 18–29)
Calcium: 10.2 mg/dL (ref 8.6–10.2)
Chloride: 99 mmol/L (ref 97–108)
Creatinine, Ser: 1.04 mg/dL (ref 0.76–1.27)
GFR calc Af Amer: 80 mL/min/{1.73_m2} (ref >59)
GFR calc non Af Amer: 69 mL/min/{1.73_m2} (ref >59)
Globulin, Total: 2.2 g/dL (ref 1.5–4.5)
Glucose: 97 mg/dL (ref 65–99)
Potassium: 4.9 mmol/L (ref 3.5–5.2)
Sodium: 140 mmol/L (ref 134–144)
Total Bilirubin: 0.5 mg/dL (ref 0.0–1.2)
Total Protein: 6.8 g/dL (ref 6.0–8.5)

## 2012-10-16 LAB — NMR, LIPOPROFILE
Cholesterol: 144 mg/dL (ref <200)
HDL Cholesterol by NMR: 44 mg/dL (ref >=40)
HDL Particle Number: 32.2 umol/L (ref >=30.5)
LDL Particle Number: 1403 nmol/L — ABNORMAL HIGH (ref <1000)
LDL Size: 19.9 nm — ABNORMAL LOW (ref >20.5)
LDLC SERPL CALC-MCNC: 67 mg/dL (ref <100)
LP-IR Score: 70 — ABNORMAL HIGH (ref <=45)
Small LDL Particle Number: 1064 nmol/L — ABNORMAL HIGH (ref <=527)
Triglycerides by NMR: 166 mg/dL — ABNORMAL HIGH (ref <150)

## 2012-10-16 LAB — VITAMIN D 25 HYDROXY (VIT D DEFICIENCY, FRACTURES): Vit D, 25-Hydroxy: 45.4 ng/mL (ref 30.0–100.0)

## 2012-11-10 ENCOUNTER — Other Ambulatory Visit: Payer: Self-pay | Admitting: Family Medicine

## 2012-12-18 ENCOUNTER — Other Ambulatory Visit: Payer: Self-pay | Admitting: Family Medicine

## 2013-03-19 ENCOUNTER — Other Ambulatory Visit: Payer: Self-pay | Admitting: Family Medicine

## 2013-03-22 NOTE — Telephone Encounter (Signed)
Last seen 10/15/12  FPW  If approved route to nurse to call in to [facility]

## 2013-03-22 NOTE — Telephone Encounter (Signed)
Patient needs to be seen. Patient has exceeded limit since last visit. Refill denied. Bring all medications at next office visit. 

## 2013-04-14 ENCOUNTER — Other Ambulatory Visit: Payer: Self-pay | Admitting: Family Medicine

## 2013-04-15 ENCOUNTER — Other Ambulatory Visit: Payer: Self-pay | Admitting: Family Medicine

## 2013-04-15 NOTE — Telephone Encounter (Signed)
Call patient : Prescription refilled & sent to pharmacy in [facility]. 

## 2013-04-15 NOTE — Telephone Encounter (Signed)
Last seen 10/15/12  FP

## 2013-05-24 ENCOUNTER — Other Ambulatory Visit: Payer: Self-pay | Admitting: Family Medicine

## 2013-05-25 NOTE — Telephone Encounter (Signed)
Patient needs to be seen. Has exceeded time since last visit. Limited quantity refilled. Needs to bring all medications to next appointment.   

## 2013-05-25 NOTE — Telephone Encounter (Signed)
Last seen 09/14 

## 2013-06-28 ENCOUNTER — Telehealth: Payer: Self-pay | Admitting: Family Medicine

## 2013-06-28 MED ORDER — LOSARTAN POTASSIUM 100 MG PO TABS: 100.0000 mg | ORAL_TABLET | Freq: Every day | ORAL | Status: DC

## 2013-06-28 NOTE — Telephone Encounter (Signed)
DONE

## 2013-07-16 ENCOUNTER — Ambulatory Visit (INDEPENDENT_AMBULATORY_CARE_PROVIDER_SITE_OTHER): Payer: Medicare PPO | Admitting: Family

## 2013-07-16 ENCOUNTER — Encounter: Payer: Self-pay | Admitting: Family

## 2013-07-16 VITALS — BP 131/66 | HR 73 | Temp 97.4°F | Ht 66.5 in | Wt 202.0 lb

## 2013-07-16 DIAGNOSIS — I10 Essential (primary) hypertension: Secondary | ICD-10-CM

## 2013-07-16 DIAGNOSIS — Z23 Encounter for immunization: Secondary | ICD-10-CM

## 2013-07-16 DIAGNOSIS — G4700 Insomnia, unspecified: Secondary | ICD-10-CM

## 2013-07-16 DIAGNOSIS — N529 Male erectile dysfunction, unspecified: Secondary | ICD-10-CM

## 2013-07-16 DIAGNOSIS — E559 Vitamin D deficiency, unspecified: Secondary | ICD-10-CM

## 2013-07-16 DIAGNOSIS — E785 Hyperlipidemia, unspecified: Secondary | ICD-10-CM

## 2013-07-16 MED ORDER — ZOLPIDEM TARTRATE 10 MG PO TABS
10.0000 mg | ORAL_TABLET | Freq: Every evening | ORAL | Status: DC | PRN
Start: 2013-07-16 — End: 2015-01-18

## 2013-07-16 MED ORDER — ATORVASTATIN CALCIUM 20 MG PO TABS: ORAL_TABLET | ORAL | Status: DC

## 2013-07-16 MED ORDER — LOSARTAN POTASSIUM 100 MG PO TABS: 100.0000 mg | ORAL_TABLET | Freq: Every day | ORAL | Status: DC

## 2013-07-16 MED ORDER — SILDENAFIL CITRATE 20 MG PO TABS: 100.0000 mg | ORAL_TABLET | Freq: Every day | ORAL | Status: DC

## 2013-07-16 NOTE — Patient Instructions (Signed)

## 2013-07-16 NOTE — Progress Notes (Signed)
Subjective:    Patient ID: Terry Winters, male    DOB: 01/03/1935, 79 y.o.   MRN: [id number]  Hypertension This is a chronic problem. The current episode started more than 1 year ago. The problem is controlled. Pertinent negatives include no anxiety, headaches, palpitations, peripheral edema or shortness of breath. Risk factors for coronary artery disease include dyslipidemia, male gender and obesity. Past treatments include angiotensin blockers. The current treatment provides moderate improvement. There is no history of kidney disease, CAD/MI or a thyroid problem. There is no history of sleep apnea.  Hyperlipidemia This is a chronic problem. The current episode started more than 1 year ago. The problem is controlled. Recent lipid tests were reviewed and are normal. He has no history of diabetes or hypothyroidism. Factors aggravating his hyperlipidemia include fatty foods. Pertinent negatives include no leg pain, myalgias or shortness of breath. Current antihyperlipidemic treatment includes statins. The current treatment provides moderate improvement of lipids. Risk factors for coronary artery disease include male sex, hypertension and dyslipidemia.      Review of Systems  HENT: Negative.   Respiratory: Negative for shortness of breath.   Cardiovascular: Negative for palpitations.  Musculoskeletal: Negative for myalgias.  Skin: Positive for rash.  Neurological: Negative for headaches.  All other systems reviewed and are negative.      Objective:   Physical Exam  Vitals reviewed. Constitutional: He is oriented to person, place, and time. He appears well-developed and well-nourished. No distress.  HENT:  Head: Normocephalic.  Right Ear: External ear normal.  Left Ear: External ear normal.  Mouth/Throat: Oropharynx is clear and moist.  Eyes: Pupils are equal, round, and reactive to light. Right eye exhibits no discharge. Left eye exhibits no discharge.  Neck: Normal range of motion.  Neck supple. No thyromegaly present.  Cardiovascular: Normal rate, regular rhythm, normal heart sounds and intact distal pulses.   No murmur heard. Pulmonary/Chest: Effort normal and breath sounds normal. No respiratory distress. He has no wheezes.  Abdominal: Soft. Bowel sounds are normal. He exhibits no distension. There is no tenderness.  Musculoskeletal: Normal range of motion. He exhibits no edema and no tenderness.  Neurological: He is alert and oriented to person, place, and time. He has normal reflexes. No cranial nerve deficit.  Skin: Skin is warm and dry. No rash noted. No erythema.  Psychiatric: He has a normal mood and affect. His behavior is normal. Judgment and thought content normal.      BP 131/66  Pulse 73  Temp(Src) 97.4 F (36.3 C) (Oral)  Ht 5' 6.5" (1.689 m)  Wt 202 lb (91.627 kg)  BMI 32.12 kg/m2     Assessment & Plan:  1. HTN (hypertension) - losartan (COZAAR) 100 MG tablet; Take 1 tablet (100 mg total) by mouth daily.  Dispense: 90 tablet; Refill: 3 - CMP14+EGFR  2. HLD (hyperlipidemia) - atorvastatin (LIPITOR) 20 MG tablet; TAKE 1 TABLET AT BEDTIME  Dispense: 90 tablet; Refill: 3 - Lipid panel  3. Vitamin D deficiency - Vit D  25 hydroxy (rtn osteoporosis monitoring)  4. Erectile dysfunction - sildenafil (REVATIO) 20 MG tablet; Take 5 tablets (100 mg total) by mouth daily. As directed prn prior to coitus.  Dispense: 150 tablet; Refill: 3  5. Insomnia - zolpidem (AMBIEN) 10 MG tablet; Take 1 tablet (10 mg total) by mouth at bedtime as needed.  Dispense: 30 tablet; Refill: 3   Continue all meds Labs pending Health Maintenance reviewed Diet and exercise encouraged RTO 3 months  [facility]  Lenna Gilford, [place]

## 2013-07-17 LAB — CMP14+EGFR
ALBUMIN: 4.5 g/dL (ref 3.5–4.8)
ALK PHOS: 73 IU/L (ref 39–117)
ALT: 33 IU/L (ref 0–44)
AST: 35 IU/L (ref 0–40)
Albumin/Globulin Ratio: 2 (ref 1.1–2.5)
BUN/Creatinine Ratio: 12 (ref 10–22)
BUN: 13 mg/dL (ref 8–27)
CALCIUM: 9.5 mg/dL (ref 8.6–10.2)
CHLORIDE: 101 mmol/L (ref 97–108)
CO2: 24 mmol/L (ref 18–29)
CREATININE: 1.08 mg/dL (ref 0.76–1.27)
GFR calc Af Amer: 77 mL/min/{1.73_m2} (ref >59)
GFR calc non Af Amer: 66 mL/min/{1.73_m2} (ref >59)
GLOBULIN, TOTAL: 2.3 g/dL (ref 1.5–4.5)
Glucose: 120 mg/dL — ABNORMAL HIGH (ref 65–99)
Potassium: 4.7 mmol/L (ref 3.5–5.2)
Sodium: 140 mmol/L (ref 134–144)
Total Bilirubin: 0.5 mg/dL (ref 0.0–1.2)
Total Protein: 6.8 g/dL (ref 6.0–8.5)

## 2013-07-17 LAB — LIPID PANEL
CHOL/HDL RATIO: 2.8 ratio (ref 0.0–5.0)
Cholesterol, Total: 121 mg/dL (ref 100–199)
HDL: 43 mg/dL (ref >39)
LDL Calculated: 62 mg/dL (ref 0–99)
TRIGLYCERIDES: 81 mg/dL (ref 0–149)
VLDL Cholesterol Cal: 16 mg/dL (ref 5–40)

## 2013-07-17 LAB — VITAMIN D 25 HYDROXY (VIT D DEFICIENCY, FRACTURES): Vit D, 25-Hydroxy: 39.6 ng/mL (ref 30.0–100.0)

## 2013-08-12 ENCOUNTER — Other Ambulatory Visit: Payer: Self-pay

## 2013-08-12 DIAGNOSIS — E785 Hyperlipidemia, unspecified: Secondary | ICD-10-CM

## 2013-08-12 MED ORDER — ATORVASTATIN CALCIUM 20 MG PO TABS: ORAL_TABLET | ORAL | Status: DC

## 2013-09-16 ENCOUNTER — Ambulatory Visit (INDEPENDENT_AMBULATORY_CARE_PROVIDER_SITE_OTHER): Payer: Medicare PPO | Admitting: Nurse Practitioner

## 2013-09-16 ENCOUNTER — Encounter: Payer: Self-pay | Admitting: Nurse Practitioner

## 2013-09-16 VITALS — BP 126/64 | HR 74 | Temp 97.1°F | Ht 66.5 in | Wt 201.8 lb

## 2013-09-16 DIAGNOSIS — L255 Unspecified contact dermatitis due to plants, except food: Secondary | ICD-10-CM

## 2013-09-16 MED ORDER — HYDROXYZINE HCL 25 MG PO TABS: 25.0000 mg | ORAL_TABLET | Freq: Three times a day (TID) | ORAL | Status: DC | PRN

## 2013-09-16 NOTE — Progress Notes (Signed)
   Subjective:    Patient ID: Terry Winters, male    DOB: January 28, 1935, 79 y.o.   MRN: [id number]  HPI Patient has been doing yard work cutting down old vines- noticed a rash the next day- very itchy    Review of Systems  Constitutional: Negative.   HENT: Negative.   Respiratory: Negative.   Cardiovascular: Negative.   Neurological: Negative.   Hematological: Negative.   Psychiatric/Behavioral: Negative.   All other systems reviewed and are negative.      Objective:   Physical Exam  Constitutional: He is oriented to person, place, and time. He appears well-developed and well-nourished.  Cardiovascular: Normal rate, regular rhythm and normal heart sounds.   Pulmonary/Chest: Effort normal and breath sounds normal.  Neurological: He is alert and oriented to person, place, and time.  Skin: Skin is warm.  Erythematous maculo- papular with occasional vesicuar esions scattered on both legs and back   BP 126/64  Pulse 74  Temp(Src) 97.1 F (36.2 C) (Oral)  Ht 5' 6.5" (1.689 m)  Wt 201 lb 12.8 oz (91.536 kg)  BMI 32.09 kg/m2        Assessment & Plan:   1. Contact dermatitis due to plant    Meds ordered this encounter  Medications  . TAMSULOSIN HCL PO    Sig: Take by mouth.  . hydrOXYzine (ATARAX/VISTARIL) 25 MG tablet    Sig: Take 1 tablet (25 mg total) by mouth 3 (three) times daily as needed.    Dispense:  30 tablet    Refill:  0    Order Specific Question:  Supervising Provider    Answer:  Deborra MedinaMOORE, DONALD W [[id number]]   Can't take steroid shot- allergic to prednisone- caused a rash Avoid scratching Calamine lotion if helps Cool compresses RTO prn  Mary-Margaret Daphine DeutscherMartin, FNP

## 2013-09-16 NOTE — Patient Instructions (Signed)

## 2013-12-30 ENCOUNTER — Ambulatory Visit (INDEPENDENT_AMBULATORY_CARE_PROVIDER_SITE_OTHER): Payer: Medicare PPO

## 2013-12-30 DIAGNOSIS — Z23 Encounter for immunization: Secondary | ICD-10-CM

## 2014-04-02 ENCOUNTER — Other Ambulatory Visit: Payer: Self-pay | Admitting: Family Medicine

## 2014-05-09 ENCOUNTER — Other Ambulatory Visit: Payer: Self-pay

## 2014-05-09 ENCOUNTER — Telehealth: Payer: Self-pay | Admitting: Family Medicine

## 2014-05-09 MED ORDER — TAMSULOSIN HCL 0.4 MG PO CAPS: 0.4000 mg | ORAL_CAPSULE | Freq: Every day | ORAL | Status: DC

## 2014-06-03 ENCOUNTER — Ambulatory Visit (INDEPENDENT_AMBULATORY_CARE_PROVIDER_SITE_OTHER): Payer: Medicare PPO | Admitting: Family Medicine

## 2014-06-03 ENCOUNTER — Encounter: Payer: Self-pay | Admitting: Family Medicine

## 2014-06-03 VITALS — BP 150/87 | HR 70 | Temp 97.3°F | Ht 66.5 in | Wt 202.0 lb

## 2014-06-03 DIAGNOSIS — I10 Essential (primary) hypertension: Secondary | ICD-10-CM | POA: Diagnosis not present

## 2014-06-03 DIAGNOSIS — E785 Hyperlipidemia, unspecified: Secondary | ICD-10-CM

## 2014-06-03 DIAGNOSIS — N400 Enlarged prostate without lower urinary tract symptoms: Secondary | ICD-10-CM

## 2014-06-03 NOTE — Progress Notes (Signed)
Subjective:    Patient ID: Terry Winters, male    DOB: [birth year], 80 y.o.   MRN: [id number]  HPI  80 year old gentleman here to follow-up blood pressure and BPH. He takes losartan 100 mg for blood pressure area has shared with him that this may not be a true 24 hour pill and would like to switch him to irbesartan. He is in agreement with this and until he runs out of current prescription will have his losartan and take med twice a day.  The Flomax seems to help but probably not as much as it did initially we talked about increasing the dose from 0.4 mg to 0.8 mg to see if that would be more effective and I think he will try that too. Also suggested taking it with food S that helps absorption and effectiveness of that drug Chief Complaint  Patient presents with  . Hyperlipidemia  . Hypertension  . Benign Prostatic Hypertrophy   Patient Active Problem List   Diagnosis Date Noted  . Erectile dysfunction 10/15/2012  . Gout   . Vitamin D deficiency   . Vitiligo 07/16/2012  . Overweight 07/16/2012  . Post herpetic neuralgia 07/16/2012  . H/O: gout 07/16/2012  . HTN (hypertension) 06/15/2012  . HLD (hyperlipidemia) 06/15/2012  . Hyperglycemia 06/15/2012  . BPH (benign prostatic hyperplasia) 06/15/2012  . Postherpetic neuralgia 06/15/2012  . Genu varus 06/15/2012   Outpatient Encounter Prescriptions as of 06/03/2014  Medication Sig  . aspirin 81 MG tablet Take 81 mg by mouth daily.  Marland Kitchen atorvastatin (LIPITOR) 20 MG tablet TAKE 1 TABLET AT BEDTIME  . cholecalciferol (VITAMIN D) 1000 UNITS tablet Take 2,000 Units by mouth daily.  . fish oil-omega-3 fatty acids 1000 MG capsule Take 3 g by mouth daily.  Marland Kitchen losartan (COZAAR) 100 MG tablet Take 1 tablet (100 mg total) by mouth daily.  . LUTEIN PO Take 1 tablet by mouth.  . Multiple Vitamins-Minerals (ZINC PO) Take 1 tablet by mouth.  . sildenafil (REVATIO) 20 MG tablet Take 5 tablets (100 mg total) by mouth daily. As directed prn prior to  coitus.  . tamsulosin (FLOMAX) 0.4 MG CAPS capsule Take 1 capsule (0.4 mg total) by mouth daily.  Marland Kitchen zolpidem (AMBIEN) 10 MG tablet Take 1 tablet (10 mg total) by mouth at bedtime as needed.  . [DISCONTINUED] hydrOXYzine (ATARAX/VISTARIL) 25 MG tablet Take 1 tablet (25 mg total) by mouth 3 (three) times daily as needed.  . [DISCONTINUED] TAMSULOSIN HCL PO Take by mouth.      Review of Systems  Constitutional: Negative.   HENT: Negative.   Respiratory: Negative.   Cardiovascular: Negative.   Gastrointestinal: Negative.   Genitourinary: Negative.   Neurological: Negative.        Objective:   Physical Exam  Constitutional: He is oriented to person, place, and time. He appears well-developed and well-nourished.  HENT:  Head: Normocephalic.  Cardiovascular: Normal rate and regular rhythm.   Pulmonary/Chest: Effort normal and breath sounds normal.  Neurological: He is alert and oriented to person, place, and time. He has normal reflexes.  Psychiatric: He has a normal mood and affect. His behavior is normal.   Blood pressure 150/87, pulse 70, temperature 97.3 F (36.3 C), temperature source Oral, height 5' 6.5" (1.689 m), weight 202 lb (91.627 kg). s        Assessment & Plan:  1. Essential hypertension As above blood pressure is okay but would like to switch to irbesartan 150 mg when current Rx expires.  2. HLD (hyperlipidemia) Due to check lipids in July. Tolerating statin well  3. BPH (benign prostatic hyperplasia) Continue with generic Flomax but may increase dose as discussed above  Frederica KusterStephen M Miller MD

## 2014-07-22 ENCOUNTER — Other Ambulatory Visit: Payer: Self-pay | Admitting: Family

## 2014-08-01 ENCOUNTER — Encounter: Payer: Self-pay | Admitting: Family Medicine

## 2014-08-01 ENCOUNTER — Ambulatory Visit (INDEPENDENT_AMBULATORY_CARE_PROVIDER_SITE_OTHER): Payer: Medicare PPO | Admitting: Family Medicine

## 2014-08-01 ENCOUNTER — Encounter (INDEPENDENT_AMBULATORY_CARE_PROVIDER_SITE_OTHER): Payer: Self-pay

## 2014-08-01 VITALS — BP 134/73 | HR 66 | Temp 97.2°F | Ht 66.5 in | Wt 197.0 lb

## 2014-08-01 DIAGNOSIS — N400 Enlarged prostate without lower urinary tract symptoms: Secondary | ICD-10-CM | POA: Diagnosis not present

## 2014-08-01 DIAGNOSIS — E785 Hyperlipidemia, unspecified: Secondary | ICD-10-CM | POA: Diagnosis not present

## 2014-08-01 DIAGNOSIS — I10 Essential (primary) hypertension: Secondary | ICD-10-CM | POA: Diagnosis not present

## 2014-08-01 NOTE — Progress Notes (Signed)
   Subjective:    Patient ID: Terry Winters, male    DOB: 1934-09-04, 80 y.o.   MRN: [id number]  HPI 80 year old gentleman here to follow-up hypertension, hyperlipidemia, and borderline diabetes. At his last testing. Glucose was 120. He was advised to restrict carbohydrates and lose weight and he is trying to do that. His wife is a diabetic so generally diet is pretty good. He is taking losartan twice a day and usually remembers to take the second dose. Regards BPH, he has noted less nocturia than before.  Patient Active Problem List   Diagnosis Date Noted  . Erectile dysfunction 10/15/2012  . Gout   . Vitamin D deficiency   . Vitiligo 07/16/2012  . Overweight 07/16/2012  . Post herpetic neuralgia 07/16/2012  . H/O: gout 07/16/2012  . HTN (hypertension) 06/15/2012  . HLD (hyperlipidemia) 06/15/2012  . Hyperglycemia 06/15/2012  . BPH (benign prostatic hyperplasia) 06/15/2012  . Postherpetic neuralgia 06/15/2012  . Genu varus 06/15/2012   Outpatient Encounter Prescriptions as of 08/01/2014  Medication Sig  . aspirin 81 MG tablet Take 81 mg by mouth daily.  Marland Kitchen atorvastatin (LIPITOR) 20 MG tablet TAKE 1 TABLET AT BEDTIME  . cholecalciferol (VITAMIN D) 1000 UNITS tablet Take 2,000 Units by mouth daily.  . fish oil-omega-3 fatty acids 1000 MG capsule Take 3 g by mouth daily.  Marland Kitchen losartan (COZAAR) 100 MG tablet TAKE 1 TABLET (100 MG TOTAL) BY MOUTH DAILY.  Marland Kitchen LUTEIN PO Take 1 tablet by mouth.  . Multiple Vitamins-Minerals (ZINC PO) Take 1 tablet by mouth.  . sildenafil (REVATIO) 20 MG tablet Take 5 tablets (100 mg total) by mouth daily. As directed prn prior to coitus.  . tamsulosin (FLOMAX) 0.4 MG CAPS capsule Take 1 capsule (0.4 mg total) by mouth daily.  Marland Kitchen zolpidem (AMBIEN) 10 MG tablet Take 1 tablet (10 mg total) by mouth at bedtime as needed.   No facility-administered encounter medications on file as of 08/01/2014.      Review of Systems  Constitutional: Negative.   Respiratory:  Negative.   Cardiovascular: Negative.   Gastrointestinal: Negative.   Genitourinary: Negative.   Neurological: Negative.   Psychiatric/Behavioral: Negative.        Objective:   Physical Exam  Constitutional: He is oriented to person, place, and time. He appears well-developed and well-nourished.  Cardiovascular: Normal rate, regular rhythm, normal heart sounds and intact distal pulses.   No Bruits appreciated  Pulmonary/Chest: Effort normal and breath sounds normal.  Neurological: He is alert and oriented to person, place, and time.  Psychiatric: He has a normal mood and affect.    BP 134/73 mmHg  Pulse 66  Temp(Src) 97.2 F (36.2 C) (Oral)  Ht 5' 6.5" (1.689 m)  Wt 197 lb (89.359 kg)  BMI 31.32 kg/m2        Assessment & Plan:  1. Essential hypertension Sugars well controlled on losartan. Continue same dose  2. HLD (hyperlipidemia) Lipids were last checked 1 year ago and LDL was at goal.  3. BPH (benign prostatic hyperplasia) Nocturia has improved. Continue Flomax at current dose

## 2014-08-02 LAB — LIPID PANEL
Chol/HDL Ratio: 3.4 ratio units (ref 0.0–5.0)
Cholesterol, Total: 150 mg/dL (ref 100–199)
HDL: 44 mg/dL (ref >39)
LDL CALC: 84 mg/dL (ref 0–99)
Triglycerides: 108 mg/dL (ref 0–149)
VLDL Cholesterol Cal: 22 mg/dL (ref 5–40)

## 2014-08-02 LAB — PSA, TOTAL AND FREE
PSA, Free Pct: 30.7 %
PSA, Free: 0.86 ng/mL
Prostate Specific Ag, Serum: 2.8 ng/mL (ref 0.0–4.0)

## 2014-08-02 LAB — CMP14+EGFR
ALK PHOS: 69 IU/L (ref 39–117)
ALT: 33 IU/L (ref 0–44)
AST: 32 IU/L (ref 0–40)
Albumin/Globulin Ratio: 1.7 (ref 1.1–2.5)
Albumin: 4.2 g/dL (ref 3.5–4.8)
BUN/Creatinine Ratio: 14 (ref 10–22)
BUN: 13 mg/dL (ref 8–27)
Bilirubin Total: 0.6 mg/dL (ref 0.0–1.2)
CALCIUM: 9.1 mg/dL (ref 8.6–10.2)
CO2: 23 mmol/L (ref 18–29)
Chloride: 99 mmol/L (ref 97–108)
Creatinine, Ser: 0.94 mg/dL (ref 0.76–1.27)
GFR calc Af Amer: 90 mL/min/{1.73_m2} (ref >59)
GFR calc non Af Amer: 78 mL/min/{1.73_m2} (ref >59)
Globulin, Total: 2.5 g/dL (ref 1.5–4.5)
Glucose: 108 mg/dL — ABNORMAL HIGH (ref 65–99)
Potassium: 4.4 mmol/L (ref 3.5–5.2)
SODIUM: 138 mmol/L (ref 134–144)
Total Protein: 6.7 g/dL (ref 6.0–8.5)

## 2014-08-08 ENCOUNTER — Other Ambulatory Visit: Payer: Self-pay | Admitting: Family

## 2014-08-23 ENCOUNTER — Other Ambulatory Visit: Payer: Self-pay | Admitting: Family Medicine

## 2014-10-29 DIAGNOSIS — R21 Rash and other nonspecific skin eruption: Secondary | ICD-10-CM | POA: Diagnosis not present

## 2014-11-02 ENCOUNTER — Ambulatory Visit: Payer: Self-pay | Admitting: Family Medicine

## 2014-11-10 ENCOUNTER — Ambulatory Visit (INDEPENDENT_AMBULATORY_CARE_PROVIDER_SITE_OTHER): Payer: Medicare PPO | Admitting: Family Medicine

## 2014-11-10 ENCOUNTER — Encounter: Payer: Self-pay | Admitting: Family Medicine

## 2014-11-10 VITALS — BP 143/66 | HR 75 | Temp 97.7°F | Ht 66.5 in | Wt 197.2 lb

## 2014-11-10 DIAGNOSIS — Z23 Encounter for immunization: Secondary | ICD-10-CM

## 2014-11-10 DIAGNOSIS — Z0000 Encounter for general adult medical examination without abnormal findings: Secondary | ICD-10-CM

## 2014-11-10 NOTE — Progress Notes (Signed)
Subjective:   Terry Winters is a 80 y.o. male who presents for Medicare Annual/Subsequent preventive examination.  Review of Systems:  Review of Systems  Constitutional: Negative for fever and chills.  HENT: Negative for congestion, ear pain, sore throat and tinnitus.   Eyes: Negative for blurred vision and pain.  Respiratory: Negative for cough, shortness of breath and wheezing.   Cardiovascular: Negative for chest pain, palpitations and leg swelling.  Gastrointestinal: Negative for heartburn, abdominal pain, diarrhea, constipation, blood in stool and melena.  Genitourinary: Negative for dysuria and hematuria.  Musculoskeletal: Negative for myalgias, back pain, joint pain and neck pain.  Skin: Negative for rash.  Neurological: Negative for dizziness, sensory change, focal weakness, weakness and headaches.  Psychiatric/Behavioral: Negative for depression and suicidal ideas.     Cardiac Risk Factors include: advanced age (>85men, >29 women);dyslipidemia;male gender     Objective:    Vitals: BP 143/66 mmHg  Pulse 75  Temp(Src) 97.7 F (36.5 C) (Oral)  Ht 5' 6.5" (1.689 m)  Wt 197 lb 3.2 oz (89.449 kg)  BMI 31.36 kg/m2  Tobacco History  Smoking status  . Former Smoker  . Types: Cigars  . Quit date: 06/15/1972  Smokeless tobacco  . Never Used     Counseling given: Not Answered   Past Medical History  Diagnosis Date  . Hypertension   . Hyperlipidemia   . Gout   . Vitamin D deficiency   . BPH (benign prostatic hypertrophy)    Past Surgical History  Procedure Laterality Date  . Neck surgery     Family History  Problem Relation Age of Onset  . ALS Father    History  Sexual Activity  . Sexual Activity: Not on file    Outpatient Encounter Prescriptions as of 11/10/2014  Medication Sig  . aspirin 81 MG tablet Take 81 mg by mouth daily.  Marland Kitchen atorvastatin (LIPITOR) 20 MG tablet TAKE 1 TABLET AT BEDTIME (MUST BEEN SEEN BEFORE NEXT REFILL)  . cholecalciferol  (VITAMIN D) 1000 UNITS tablet Take 2,000 Units by mouth daily.  . fish oil-omega-3 fatty acids 1000 MG capsule Take 3 g by mouth daily.  Marland Kitchen losartan (COZAAR) 100 MG tablet TAKE 1 TABLET (100 MG TOTAL) BY MOUTH DAILY.  Marland Kitchen LUTEIN PO Take 1 tablet by mouth.  . Multiple Vitamins-Minerals (ZINC PO) Take 1 tablet by mouth.  . tamsulosin (FLOMAX) 0.4 MG CAPS capsule TAKE 1 CAPSULE EVERY DAY  . zolpidem (AMBIEN) 10 MG tablet Take 1 tablet (10 mg total) by mouth at bedtime as needed.  . [DISCONTINUED] sildenafil (REVATIO) 20 MG tablet Take 5 tablets (100 mg total) by mouth daily. As directed prn prior to coitus.   No facility-administered encounter medications on file as of 11/10/2014.    Activities of Daily Living In your present state of health, do you have any difficulty performing the following activities: 11/10/2014  Hearing? N  Vision? N  Difficulty concentrating or making decisions? N  Walking or climbing stairs? N  Dressing or bathing? N  Doing errands, shopping? N  Preparing Food and eating ? N  Using the Toilet? N  In the past six months, have you accidently leaked urine? N  Do you have problems with loss of bowel control? N  Managing your Medications? N  Managing your Finances? N  Housekeeping or managing your Housekeeping? N    Patient Care Team: Junie Spencer, FNP as PCP - General (Nurse Practitioner) Bjorn Pippin, MD as Attending Physician (Urology)  Assessment:    Problem List Items Addressed This Visit    None    Visit Diagnoses    Routine history and physical examination of adult    -  Primary    No major issues the past all screening       Exercise Activities and Dietary recommendations Current Exercise Habits:: Home exercise routine, Type of exercise: walking;Other - see comments (golfing, average 2-3 times per month), Time (Minutes): 25, Frequency (Times/Week): 3, Weekly Exercise (Minutes/Week): 75, Intensity: Moderate  Goals    None     Fall Risk Fall Risk   11/10/2014 08/01/2014 07/16/2013  Falls in the past year? No No Yes  Number falls in past yr: - - 1  Risk for fall due to : - - Other (Comment)  Risk for fall due to (comments): - - Fell on ice on his deck.   Depression Screen PHQ 2/9 Scores 11/10/2014 08/01/2014 07/16/2013  PHQ - 2 Score 0 0 0   Cognitive Testing MMSE - Mini Mental State Exam 11/10/2014  Orientation to time 5  Orientation to Place 5  Registration 3  Attention/ Calculation 5  Recall 3  Language- name 2 objects 2  Language- repeat 1  Language- follow 3 step command 3  Language- read & follow direction 1  Write a sentence 1  Copy design 1  Total score 30    Immunization History  Administered Date(s) Administered  . Influenza,inj,Quad PF,36+ Mos 12/30/2013  . Pneumococcal Polysaccharide-23 07/16/2013   Screening Tests Health Maintenance  Topic Date Due  . ZOSTAVAX  11/10/2015 (Originally 10/05/1996)  . TETANUS/TDAP  11/10/2015 (Originally 10/06/1955)  . PNA vac Low Risk Adult (2 of 2 - PCV13) 11/10/2015 (Originally 07/17/2014)  . INFLUENZA VACCINE  09/12/2015      Plan:    During the course of the visit the patient was educated and counseled about the following appropriate screening and preventive services:   Vaccines to include Pneumoccal, Influenza, Hepatitis B, Td, Zostavax, HCV  Electrocardiogram  Cardiovascular Disease  Colorectal cancer screening  Diabetes screening  Prostate Cancer Screening  Glaucoma screening  Nutrition counseling   Smoking cessation counseling  Patient Instructions (the written plan) was given to the patient.    Nils Pyle, MD  11/10/2014

## 2014-12-03 ENCOUNTER — Other Ambulatory Visit: Payer: Self-pay | Admitting: Family

## 2014-12-05 MED ORDER — TAMSULOSIN HCL 0.4 MG PO CAPS: 0.4000 mg | ORAL_CAPSULE | Freq: Every day | ORAL | Status: DC

## 2014-12-05 MED ORDER — ATORVASTATIN CALCIUM 20 MG PO TABS: ORAL_TABLET | ORAL | Status: DC

## 2014-12-05 NOTE — Telephone Encounter (Signed)
done

## 2015-01-16 ENCOUNTER — Other Ambulatory Visit: Payer: Self-pay | Admitting: Family Medicine

## 2015-01-16 ENCOUNTER — Other Ambulatory Visit: Payer: Self-pay | Admitting: Family

## 2015-01-16 DIAGNOSIS — G4700 Insomnia, unspecified: Secondary | ICD-10-CM

## 2015-01-17 NOTE — Telephone Encounter (Signed)
Request sent to provider. Awaiting response. 

## 2015-01-17 NOTE — Telephone Encounter (Signed)
Last seen 11/10/14  Dr Dettinger  If approved route to nurse to call into [facility]

## 2015-01-18 ENCOUNTER — Other Ambulatory Visit: Payer: Self-pay | Admitting: Family Medicine

## 2015-01-18 NOTE — Telephone Encounter (Signed)
What prescription is he asking for a refill on?

## 2015-01-18 NOTE — Telephone Encounter (Signed)
Last seen 08/01/14  Dr Hyacinth MeekerMiller  If approved route to nurse to call into [facility]

## 2015-01-19 NOTE — Telephone Encounter (Signed)
rx called into pharmacy

## 2015-01-20 ENCOUNTER — Other Ambulatory Visit: Payer: Self-pay | Admitting: Family Medicine

## 2015-02-01 ENCOUNTER — Ambulatory Visit (INDEPENDENT_AMBULATORY_CARE_PROVIDER_SITE_OTHER): Payer: Medicare PPO | Admitting: Family Medicine

## 2015-02-01 ENCOUNTER — Encounter: Payer: Self-pay | Admitting: Family Medicine

## 2015-02-01 VITALS — BP 130/74 | HR 70 | Temp 97.4°F | Ht 66.5 in | Wt 202.2 lb

## 2015-02-01 DIAGNOSIS — N400 Enlarged prostate without lower urinary tract symptoms: Secondary | ICD-10-CM | POA: Diagnosis not present

## 2015-02-01 DIAGNOSIS — E663 Overweight: Secondary | ICD-10-CM

## 2015-02-01 DIAGNOSIS — E785 Hyperlipidemia, unspecified: Secondary | ICD-10-CM

## 2015-02-01 DIAGNOSIS — I10 Essential (primary) hypertension: Secondary | ICD-10-CM

## 2015-02-01 NOTE — Progress Notes (Signed)
   Subjective:    Patient ID: Terry Winters, male    DOB: [birth year], 80 y.o.   MRN: [id number]  HPI  80 year old gentleman who is here to follow-up hypertension, hyperglycemia, and BPH. He really is doing well. He takes losartan for his blood pressure and it appears to be at goal. He no longer takes lipid-lowering drugs but does take Flomax for his enlarged prostate. He requests referral back to [facility] next month to follow-up the problem. He last had lipids checked 6 months ago and they were at goal    Review of Systems  Constitutional: Negative.   Respiratory: Negative.   Cardiovascular: Negative.   Genitourinary: Positive for frequency.  Neurological: Negative.       BP 130/74 mmHg  Pulse 70  Temp(Src) 97.4 F (36.3 C) (Oral)  Ht 5' 6.5" (1.689 m)  Wt 202 lb 4 oz (91.74 kg)  BMI 32.16 kg/m2  Objective:   Physical Exam  Constitutional: He is oriented to person, place, and time. He appears well-developed and well-nourished.  Cardiovascular: Normal rate, regular rhythm, normal heart sounds and intact distal pulses.   Pulmonary/Chest: Effort normal and breath sounds normal.  Musculoskeletal: Normal range of motion.  Neurological: He is alert and oriented to person, place, and time.          Assessment & Plan:  1. Essential hypertension Sugar well controlled on losartan. Continue  2. HLD (hyperlipidemia) Lipids are at goal. I do believe he is taking atorvastatin. I wrote also said he was not taking it in history of present illness  3. Overweight 10 year discuss his weight. Cautioned about weight gain and impact on diabetes blood pressure joints etc.  4. BPH (benign prostatic hyperplasia)  Flomax. Request urology consult to follow-up  Frederica KusterStephen M Miller MD

## 2015-02-04 ENCOUNTER — Other Ambulatory Visit: Payer: Self-pay | Admitting: Family Medicine

## 2015-03-02 ENCOUNTER — Ambulatory Visit (INDEPENDENT_AMBULATORY_CARE_PROVIDER_SITE_OTHER): Payer: Medicare PPO | Admitting: Family Medicine

## 2015-03-02 ENCOUNTER — Encounter: Payer: Self-pay | Admitting: Family Medicine

## 2015-03-02 VITALS — BP 133/73 | HR 100 | Temp 97.3°F | Ht 66.5 in | Wt 198.6 lb

## 2015-03-02 DIAGNOSIS — K2100 Gastro-esophageal reflux disease with esophagitis, without bleeding: Secondary | ICD-10-CM

## 2015-03-02 DIAGNOSIS — K219 Gastro-esophageal reflux disease without esophagitis: Secondary | ICD-10-CM | POA: Insufficient documentation

## 2015-03-02 MED ORDER — OMEPRAZOLE 20 MG PO CPDR
20.0000 mg | DELAYED_RELEASE_CAPSULE | Freq: Every day | ORAL | Status: DC
Start: 2015-03-02 — End: 2016-01-08

## 2015-03-02 NOTE — Progress Notes (Signed)
BP 133/73 mmHg  Pulse 100  Temp(Src) 97.3 F (36.3 C) (Oral)  Ht 5' 6.5" (1.689 m)  Wt 198 lb 9.6 oz (90.084 kg)  BMI 31.58 kg/m2   Subjective:    Patient ID: Terry Winters, male    DOB: 05/14/1934, 81 y.o.   MRN: [id number]  HPI: Terry Winters is a 81 y.o. male presenting on 03/02/2015 for Abdominal Pain and Gastroesophageal Reflux   HPI Abdominal pain Patient has been having epigastric abdominal pain and indigestion for the past several days. The abdominal pain and indigestion feels like what he had previously when he used to have a lot of acid reflux. He did take 1 Zantac and felt better but then has not taken any since. He also has increased early satiety but does not have any association with the pain coming on after feeding.   Relevant past medical, surgical, family and social history reviewed and updated as indicated. Interim medical history since our last visit reviewed. Allergies and medications reviewed and updated.  Review of Systems  Constitutional: Negative for fever.  HENT: Negative for ear discharge and ear pain.   Eyes: Negative for discharge and visual disturbance.  Respiratory: Negative for shortness of breath and wheezing.   Cardiovascular: Negative for chest pain and leg swelling.  Gastrointestinal: Positive for abdominal pain. Negative for vomiting, diarrhea, constipation, blood in stool and abdominal distention.  Genitourinary: Negative for urgency, frequency, hematuria and difficulty urinating.  Musculoskeletal: Negative for back pain and gait problem.  Skin: Negative for rash.  Neurological: Negative for syncope, light-headedness and headaches.  All other systems reviewed and are negative.   Per HPI unless specifically indicated above     Medication List       This list is accurate as of: 03/02/15  3:50 PM.  Always use your most recent med list.               aspirin 81 MG tablet  Take 81 mg by mouth daily.     atorvastatin 20 MG tablet    Commonly known as:  LIPITOR  TAKE 1 TABLET AT BEDTIME     cholecalciferol 1000 units tablet  Commonly known as:  VITAMIN D  Take 2,000 Units by mouth daily.     fish oil-omega-3 fatty acids 1000 MG capsule  Take 3 g by mouth daily.     losartan 100 MG tablet  Commonly known as:  COZAAR  TAKE 1 TABLET (100 MG TOTAL) BY MOUTH DAILY.     losartan 100 MG tablet  Commonly known as:  COZAAR  TAKE 1 TABLET (100 MG TOTAL) BY MOUTH DAILY.     LUTEIN PO  Take 1 tablet by mouth.     omeprazole 20 MG capsule  Commonly known as:  PRILOSEC  Take 1 capsule (20 mg total) by mouth daily.     tamsulosin 0.4 MG Caps capsule  Commonly known as:  FLOMAX  Take 1 capsule (0.4 mg total) by mouth daily.     ZINC PO  Take 1 tablet by mouth.     zolpidem 10 MG tablet  Commonly known as:  AMBIEN  TAKE 1 TAB BY MOUTH AT BEDTIME AS NEEDED           Objective:    BP 133/73 mmHg  Pulse 100  Temp(Src) 97.3 F (36.3 C) (Oral)  Ht 5' 6.5" (1.689 m)  Wt 198 lb 9.6 oz (90.084 kg)  BMI 31.58 kg/m2  Wt Readings from Last 3 Encounters:  03/02/15 198 lb 9.6 oz (90.084 kg)  02/01/15 202 lb 4 oz (91.74 kg)  11/10/14 197 lb 3.2 oz (89.449 kg)    Physical Exam  Constitutional: He is oriented to person, place, and time. He appears well-developed and well-nourished. No distress.  Eyes: Conjunctivae and EOM are normal. Pupils are equal, round, and reactive to light. Right eye exhibits no discharge. No scleral icterus.  Cardiovascular: Normal rate, regular rhythm, normal heart sounds and intact distal pulses.   No murmur heard. Pulmonary/Chest: Effort normal and breath sounds normal. No respiratory distress. He has no wheezes.  Abdominal: Soft. Bowel sounds are normal. He exhibits no distension and no mass. There is tenderness in the epigastric area. There is no rebound, no guarding and no CVA tenderness.  Musculoskeletal: Normal range of motion. He exhibits no edema.  Neurological: He is alert and  oriented to person, place, and time. Coordination normal.  Skin: Skin is warm and dry. No rash noted. He is not diaphoretic.  Psychiatric: He has a normal mood and affect. His behavior is normal.  Vitals reviewed.      Assessment & Plan:   Problem List Items Addressed This Visit      Digestive   GERD (gastroesophageal reflux disease) - Primary   Relevant Medications   omeprazole (PRILOSEC) 20 MG capsule       Follow up plan: Return if symptoms worsen or fail to improve.  Counseling provided for all of the vaccine components No orders of the defined types were placed in this encounter.    Arville Care, MD [facility] 03/02/2015, 3:50 PM

## 2015-03-17 ENCOUNTER — Other Ambulatory Visit: Payer: Self-pay | Admitting: Family Medicine

## 2015-04-20 DIAGNOSIS — H2513 Age-related nuclear cataract, bilateral: Secondary | ICD-10-CM | POA: Diagnosis not present

## 2015-05-31 ENCOUNTER — Ambulatory Visit (INDEPENDENT_AMBULATORY_CARE_PROVIDER_SITE_OTHER): Payer: Medicare PPO | Admitting: Family Medicine

## 2015-05-31 DIAGNOSIS — L03114 Cellulitis of left upper limb: Secondary | ICD-10-CM

## 2015-05-31 MED ORDER — AMOXICILLIN-POT CLAVULANATE 875-125 MG PO TABS: 1.0000 | ORAL_TABLET | Freq: Two times a day (BID) | ORAL | Status: DC

## 2015-05-31 NOTE — Progress Notes (Signed)
Subjective:  Patient ID: Samantha Olivera, male    DOB: [birth year]  Age: 81 y.o. 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 The next day, 4 days ago, he noticed swelling in the left hand. This has progressed up the hand and forearm with swelling and erythema. It is moderately painful. Concerned about spider bite.   History Sylis has a past medical history of Hypertension; Hyperlipidemia; Gout; Vitamin D deficiency; and BPH (benign prostatic hypertrophy).   He has past surgical history that includes Neck surgery.   His family history includes ALS in his father.He reports that he quit smoking about 42 years ago. His smoking use included Cigars. He has never used smokeless tobacco. He reports that he does not drink alcohol or use illicit drugs.    ROS Review of Systems  Constitutional: Negative for fever, chills and diaphoresis.  HENT: Negative for rhinorrhea and sore throat.   Respiratory: Negative for cough and shortness of breath.   Gastrointestinal: Negative for abdominal pain.  Musculoskeletal: Negative for myalgias.  Skin: Negative for rash.  Neurological: Negative for weakness and headaches.    Objective:  There were no vitals taken for this visit.  BP Readings from Last 3 Encounters:  03/02/15 133/73  02/01/15 130/74  11/10/14 143/66    Wt Readings from Last 3 Encounters:  03/02/15 198 lb 9.6 oz (90.084 kg)  02/01/15 202 lb 4 oz (91.74 kg)  11/10/14 197 lb 3.2 oz (89.449 kg)     Physical Exam  Constitutional: He is oriented to person, place, and time. He appears well-developed and well-nourished.  HENT:  Head: Normocephalic and atraumatic.  Right Ear: Tympanic membrane and external ear normal. No decreased hearing is noted.  Left Ear: Tympanic membrane and external ear normal. No decreased hearing is noted.  Mouth/Throat: No oropharyngeal exudate or posterior oropharyngeal erythema.  Eyes:  Pupils are equal, round, and reactive to light.  Neck: Normal range of motion. Neck supple.  Cardiovascular: Normal rate and regular rhythm.   No murmur heard. Pulmonary/Chest: Breath sounds normal. No respiratory distress.  Abdominal: Soft. Bowel sounds are normal. He exhibits no mass. There is no tenderness.  Neurological: He is alert and oriented to person, place, and time.  Skin:  There is erythema and 2+  Edema extending from the PIPs dorsally up to the mid 1/3 of the forearm.  Vitals reviewed.    Lab Results  Component Value Date   GLUCOSE 108* 08/01/2014   CHOL 150 08/01/2014   TRIG 108 08/01/2014   HDL 44 08/01/2014   LDLCALC 84 08/01/2014   ALT 33 08/01/2014   AST 32 08/01/2014   NA 138 08/01/2014   K 4.4 08/01/2014   CL 99 08/01/2014   CREATININE 0.94 08/01/2014   BUN 13 08/01/2014   CO2 23 08/01/2014   HGBA1C 5.7 10/15/2012    No results found.  Assessment & Plan:   Jamorian was seen today for left hand swollen.  Diagnoses and all orders for this visit:  Cellulitis of hand, left  Other orders -     Discontinue: amoxicillin-clavulanate (AUGMENTIN) 875-125 MG tablet; Take 1 tablet by mouth 2 (two) times daily. Take all of this medication -     amoxicillin-clavulanate (AUGMENTIN) 875-125 MG tablet; Take 1 tablet by mouth 2 (two) times daily. Take all of this medication    I am having Mr. Darnell maintain his aspirin, cholecalciferol, fish oil-omega-3  fatty acids, Multiple Vitamins-Minerals (ZINC PO), LUTEIN PO, tamsulosin, zolpidem, losartan, losartan, omeprazole, atorvastatin, and amoxicillin-clavulanate.  Meds ordered this encounter  Medications  . DISCONTD: amoxicillin-clavulanate (AUGMENTIN) 875-125 MG tablet    Sig: Take 1 tablet by mouth 2 (two) times daily. Take all of this medication    Dispense:  20 tablet    Refill:  0  . amoxicillin-clavulanate (AUGMENTIN) 875-125 MG tablet    Sig: Take 1 tablet by mouth 2 (two) times daily. Take all of this  medication    Dispense:  20 tablet    Refill:  0     Follow-up: Return if symptoms worsen or fail to improve.  Mechele ClaudeWarren Treesa Mccully, M.D.

## 2015-07-14 ENCOUNTER — Telehealth: Payer: Self-pay | Admitting: Family

## 2015-07-14 ENCOUNTER — Other Ambulatory Visit: Payer: Self-pay | Admitting: *Deleted

## 2015-07-14 MED ORDER — TAMSULOSIN HCL 0.4 MG PO CAPS: 0.4000 mg | ORAL_CAPSULE | Freq: Every day | ORAL | Status: DC

## 2015-07-14 MED ORDER — ATORVASTATIN CALCIUM 20 MG PO TABS: 20.0000 mg | ORAL_TABLET | Freq: Every day | ORAL | Status: DC

## 2015-08-12 ENCOUNTER — Other Ambulatory Visit: Payer: Self-pay | Admitting: Family Medicine

## 2015-08-14 NOTE — Telephone Encounter (Signed)
Last seen 03/02/15 Dr Louanne Skyeettinger   Neysa Bonitohristy  PCP   Requesting 90 day supply

## 2015-10-17 ENCOUNTER — Other Ambulatory Visit: Payer: Self-pay | Admitting: Family Medicine

## 2015-10-17 NOTE — Telephone Encounter (Signed)
Last refill without being seen 

## 2015-10-24 ENCOUNTER — Other Ambulatory Visit: Payer: Self-pay | Admitting: Family Medicine

## 2015-11-06 ENCOUNTER — Telehealth: Payer: Self-pay | Admitting: Family

## 2015-12-02 ENCOUNTER — Other Ambulatory Visit: Payer: Self-pay | Admitting: Family

## 2015-12-04 NOTE — Telephone Encounter (Signed)
Patient NTBS for follow up and lab work  

## 2015-12-04 NOTE — Telephone Encounter (Signed)
Patient aware, script was sent in but he must have an office visit and lab work before further refills.

## 2016-01-08 ENCOUNTER — Ambulatory Visit (INDEPENDENT_AMBULATORY_CARE_PROVIDER_SITE_OTHER): Payer: Medicare PPO | Admitting: Family Medicine

## 2016-01-08 ENCOUNTER — Encounter: Payer: Self-pay | Admitting: Family Medicine

## 2016-01-08 VITALS — BP 117/73 | HR 96 | Temp 96.7°F | Ht 66.5 in | Wt 204.0 lb

## 2016-01-08 DIAGNOSIS — Z23 Encounter for immunization: Secondary | ICD-10-CM | POA: Diagnosis not present

## 2016-01-08 DIAGNOSIS — I10 Essential (primary) hypertension: Secondary | ICD-10-CM | POA: Diagnosis not present

## 2016-01-08 DIAGNOSIS — N400 Enlarged prostate without lower urinary tract symptoms: Secondary | ICD-10-CM | POA: Diagnosis not present

## 2016-01-08 DIAGNOSIS — E782 Mixed hyperlipidemia: Secondary | ICD-10-CM | POA: Diagnosis not present

## 2016-01-08 DIAGNOSIS — G4700 Insomnia, unspecified: Secondary | ICD-10-CM

## 2016-01-08 MED ORDER — ZOLPIDEM TARTRATE 10 MG PO TABS: 10.0000 mg | ORAL_TABLET | Freq: Every evening | ORAL | 1 refills | Status: DC | PRN

## 2016-01-08 NOTE — Progress Notes (Signed)
BP 117/73   Pulse 96   Temp (!) 96.7 F (35.9 C) (Oral)   Ht 5' 6.5" (1.689 m)   Wt 204 lb (92.5 kg)   BMI 32.43 kg/m    Subjective:    Patient ID: Terry Winters, male    DOB: Feb 03, 1935, 81 y.o.   MRN: [id number]  HPI: Terry Winters is a 81 y.o. male presenting on 01/08/2016 for Annual Exam   HPI Hypertension recheck Patient is coming in for a hypertension recheck. His blood pressure today is 117/73. He is currently on losartan. Patient denies headaches, blurred vision, chest pains, shortness of breath, or weakness. Denies any side effects from medication and is content with current medication.   BPH and hyperlipidemia lab recheck Patient is coming in for lab recheck for his BPH and hyperlipidemia. He is currently on Flomax and Lipitor and they are doing well for him. He denies any side effects from his medications. We will check labs for both of these.  Insomnia Patient is coming in for refill on his Ambien for insomnia. He does use it every night but tries to use a half a tablet most of the time instead of using a full tablet.  Relevant past medical, surgical, family and social history reviewed and updated as indicated. Interim medical history since our last visit reviewed. Allergies and medications reviewed and updated.  Review of Systems  Constitutional: Negative for chills and fever.  Eyes: Negative for discharge.  Respiratory: Negative for shortness of breath and wheezing.   Cardiovascular: Negative for chest pain and leg swelling.  Genitourinary: Positive for frequency.  Musculoskeletal: Negative for back pain and gait problem.  Skin: Negative for rash.  Neurological: Negative for dizziness, light-headedness, numbness and headaches.  Psychiatric/Behavioral: Positive for sleep disturbance. Negative for decreased concentration, dysphoric mood, self-injury and suicidal ideas. The patient is not nervous/anxious.   All other systems reviewed and are negative.   Per HPI  unless specifically indicated above     Medication List       Accurate as of 01/08/16 11:06 AM. Always use your most recent med list.          aspirin 81 MG tablet Take 81 mg by mouth daily.   atorvastatin 20 MG tablet Commonly known as:  LIPITOR TAKE 1 TABLET AT BEDTIME (NEED MD APPOINTMENT FOR FURTHER REFILLS)   cholecalciferol 1000 units tablet Commonly known as:  VITAMIN D Take 2,000 Units by mouth daily.   fish oil-omega-3 fatty acids 1000 MG capsule Take 3 g by mouth daily.   losartan 100 MG tablet Commonly known as:  COZAAR TAKE 1 TABLET (100 MG TOTAL) BY MOUTH DAILY.   LUTEIN PO Take 1 tablet by mouth.   tamsulosin 0.4 MG Caps capsule Commonly known as:  FLOMAX TAKE 1 CAPSULE (0.4 MG TOTAL) BY MOUTH DAILY.   ZINC PO Take 1 tablet by mouth.   zolpidem 10 MG tablet Commonly known as:  AMBIEN Take 1 tablet (10 mg total) by mouth at bedtime as needed for sleep.          Objective:    BP 117/73   Pulse 96   Temp (!) 96.7 F (35.9 C) (Oral)   Ht 5' 6.5" (1.689 m)   Wt 204 lb (92.5 kg)   BMI 32.43 kg/m   Wt Readings from Last 3 Encounters:  01/08/16 204 lb (92.5 kg)  03/02/15 198 lb 9.6 oz (90.1 kg)  02/01/15 202 lb 4 oz (91.7 kg)  Physical Exam  Constitutional: He is oriented to person, place, and time. He appears well-developed and well-nourished. No distress.  Eyes: Conjunctivae are normal. Right eye exhibits no discharge. No scleral icterus.  Cardiovascular: Normal rate, regular rhythm, normal heart sounds and intact distal pulses.   No murmur heard. Pulmonary/Chest: Effort normal and breath sounds normal. No respiratory distress. He has no wheezes. He has no rales.  Musculoskeletal: Normal range of motion. He exhibits no edema.  Neurological: He is alert and oriented to person, place, and time. Coordination normal.  Skin: Skin is warm and dry. No rash noted. He is not diaphoretic.  Psychiatric: He has a normal mood and affect. His  behavior is normal.  Nursing note and vitals reviewed.     Assessment & Plan:   Problem List Items Addressed This Visit      Cardiovascular and Mediastinum   HTN (hypertension) - Primary   Relevant Orders   CMP14+EGFR     Genitourinary   BPH (benign prostatic hyperplasia)   Relevant Orders   PSA, total and free   Ambulatory referral to Urology     Other   HLD (hyperlipidemia)   Relevant Orders   Lipid panel    Other Visit Diagnoses    Insomnia, unspecified type       Relevant Medications   zolpidem (AMBIEN) 10 MG tablet       Follow up plan: Return in about 6 months (around 07/07/2016), or if symptoms worsen or fail to improve, for Hypertension and cholesterol recheck.  Counseling provided for all of the vaccine components Orders Placed This Encounter  Procedures  . CMP14+EGFR  . Lipid panel  . PSA, total and free  . Ambulatory referral to Urology    Caryl Pina, MD [facility] 01/08/2016, 11:06 AM

## 2016-01-18 ENCOUNTER — Other Ambulatory Visit: Payer: Self-pay | Admitting: Family Medicine

## 2016-03-01 ENCOUNTER — Other Ambulatory Visit: Payer: Self-pay | Admitting: Family

## 2016-03-02 MED ORDER — ATORVASTATIN CALCIUM 20 MG PO TABS: 20.0000 mg | ORAL_TABLET | Freq: Every day | ORAL | 0 refills | Status: DC

## 2016-03-02 NOTE — Telephone Encounter (Signed)
done

## 2016-03-03 ENCOUNTER — Other Ambulatory Visit: Payer: Self-pay | Admitting: Family

## 2016-03-04 ENCOUNTER — Telehealth: Payer: Self-pay | Admitting: Family

## 2016-03-05 NOTE — Telephone Encounter (Signed)
Both have been done, LVM

## 2016-03-08 ENCOUNTER — Ambulatory Visit (INDEPENDENT_AMBULATORY_CARE_PROVIDER_SITE_OTHER): Payer: Medicare PPO | Admitting: Urology

## 2016-03-08 ENCOUNTER — Other Ambulatory Visit (HOSPITAL_COMMUNITY)
Admission: RE | Admit: 2016-03-08 | Discharge: 2016-03-08 | Disposition: A | Discharge status: Home / Self Care | Payer: Medicare PPO | Source: Other Acute Inpatient Hospital | Attending: Urology | Admitting: Urology

## 2016-03-08 DIAGNOSIS — N3941 Urge incontinence: Secondary | ICD-10-CM | POA: Diagnosis not present

## 2016-03-08 DIAGNOSIS — R3121 Asymptomatic microscopic hematuria: Secondary | ICD-10-CM

## 2016-03-08 DIAGNOSIS — R3912 Poor urinary stream: Secondary | ICD-10-CM | POA: Diagnosis not present

## 2016-03-08 DIAGNOSIS — N401 Enlarged prostate with lower urinary tract symptoms: Secondary | ICD-10-CM | POA: Diagnosis not present

## 2016-03-08 LAB — URINALYSIS, COMPLETE (UACMP) WITH MICROSCOPIC
Bacteria, UA: NONE SEEN
Bilirubin Urine: NEGATIVE
Glucose, UA: 50 mg/dL — AB
Hgb urine dipstick: NEGATIVE
Ketones, ur: NEGATIVE mg/dL
Leukocytes, UA: NEGATIVE
Nitrite: NEGATIVE
PROTEIN: NEGATIVE mg/dL
Specific Gravity, Urine: 1.018 (ref 1.005–1.030)
pH: 5 (ref 5.0–8.0)

## 2016-05-16 ENCOUNTER — Telehealth: Payer: Self-pay | Admitting: Family

## 2016-05-16 NOTE — Telephone Encounter (Signed)
Pt advised he needs to call his urologist since Dr Margo Aye was the one who gave him this rx and pt voiced understanding.

## 2016-05-20 ENCOUNTER — Other Ambulatory Visit: Payer: Self-pay | Admitting: Family Medicine

## 2016-05-20 MED ORDER — TAMSULOSIN HCL 0.4 MG PO CAPS: 0.4000 mg | ORAL_CAPSULE | Freq: Every day | ORAL | 0 refills | Status: DC

## 2016-05-20 NOTE — Telephone Encounter (Signed)
Returned patient's phone call.  Patient states that he no longer needs this medication.  He received his mail order today.  Patient will call to cancel Rx sent to [facility]

## 2016-05-31 ENCOUNTER — Encounter: Payer: Self-pay | Admitting: Family Medicine

## 2016-05-31 ENCOUNTER — Ambulatory Visit (INDEPENDENT_AMBULATORY_CARE_PROVIDER_SITE_OTHER): Payer: Medicare PPO | Admitting: Family Medicine

## 2016-05-31 VITALS — BP 131/78 | HR 81 | Temp 96.9°F | Resp 20 | Ht 66.5 in | Wt 203.2 lb

## 2016-05-31 DIAGNOSIS — J209 Acute bronchitis, unspecified: Secondary | ICD-10-CM | POA: Diagnosis not present

## 2016-05-31 MED ORDER — AZITHROMYCIN 250 MG PO TABS: ORAL_TABLET | ORAL | 0 refills | Status: DC

## 2016-05-31 NOTE — Progress Notes (Signed)
   HPI  Patient presents today here with cough.  Patient explains he has had 2 to have weeks of cough and congestion. He felt at first it was due to allergies and started taking antihistamine daily. He is not sure which brand, however it sounds similar to Claritin. Benadryl causes sedation for him.  He has had severe persistent cough, this has led to some headaches. Patient states that overall he feels somewhat stable to maybe a little bit better, however is concerned that it's lingering on so long.  He is tolerating food and fluids like usual. No shortness of breath. No facial pain or pressure. Using nasal saline rinses for congestion  PMH: Smoking status noted ROS: Per HPI  Objective: BP 131/78   Pulse 81   Temp (!) 96.9 F (36.1 C) (Oral)   Resp 20   Ht 5' 6.5" (1.689 m)   Wt 203 lb 3.2 oz (92.2 kg)   SpO2 97%   BMI 32.31 kg/m  Gen: NAD, alert, cooperative with exam HEENT: NCAT, nares with swollen turbinates and boggy mucosa bilaterally, TMs normal bilaterally, oropharynx with some cobblestoning CV: RRR, good S1/S2, no murmur Resp: CTABL, no wheezes, non-labored Ext: No edema, warm Neuro: Alert and oriented, No gross deficits  Assessment and plan:  # Acute bronchitis Considering course of illness I think it's most prudent to go ahead and cover with azithromycin. I did emphasize the importance of taking a daily long-acting antihistamine. Return to clinic with any concerns or worsening symptoms.    Meds ordered this encounter  Medications  . azithromycin (ZITHROMAX) 250 MG tablet    Sig: Take 2 tablets on day 1 and 1 tablet daily after that    Dispense:  6 tablet    Refill:  0    Murtis Sink, MD [facility] 05/31/2016, 6:01 PM

## 2016-05-31 NOTE — Patient Instructions (Signed)
Great to see you!  Take all antibiotics  Also continue taking the antihistamine. I usually recommend taking zyrtec (cetirizine), Claritin (loratidine), or Allegra (fexofenadine) 1 pill once daily.    Acute Bronchitis, Adult Acute bronchitis is when air tubes (bronchi) in the lungs suddenly get swollen. The condition can make it hard to breathe. It can also cause these symptoms:  A cough.  Coughing up clear, yellow, or green mucus.  Wheezing.  Chest congestion.  Shortness of breath.  A fever.  Body aches.  Chills.  A sore throat. Follow these instructions at home: Medicines   Take over-the-counter and prescription medicines only as told by your doctor.  If you were prescribed an antibiotic medicine, take it as told by your doctor. Do not stop taking the antibiotic even if you start to feel better. General instructions   Rest.  Drink enough fluids to keep your pee (urine) clear or pale yellow.  Avoid smoking and secondhand smoke. If you smoke and you need help quitting, ask your doctor. Quitting will help your lungs heal faster.  Use an inhaler, cool mist vaporizer, or humidifier as told by your doctor.  Keep all follow-up visits as told by your doctor. This is important. How is this prevented? To lower your risk of getting this condition again:  Wash your hands often with soap and water. If you cannot use soap and water, use hand sanitizer.  Avoid contact with people who have cold symptoms.  Try not to touch your hands to your mouth, nose, or eyes.  Make sure to get the flu shot every year. Contact a doctor if:  Your symptoms do not get better in 2 weeks. Get help right away if:  You cough up blood.  You have chest pain.  You have very bad shortness of breath.  You become dehydrated.  You faint (pass out) or keep feeling like you are going to pass out.  You keep throwing up (vomiting).  You have a very bad headache.  Your fever or chills gets  worse. This information is not intended to replace advice given to you by your health care provider. Make sure you discuss any questions you have with your health care provider. Document Released: 07/17/2007 Document Revised: 09/06/2015 Document Reviewed: 07/19/2015 Elsevier Interactive Patient Education  2017 [employer].

## 2016-06-01 ENCOUNTER — Other Ambulatory Visit: Payer: Self-pay | Admitting: Family Medicine

## 2016-06-14 ENCOUNTER — Ambulatory Visit (INDEPENDENT_AMBULATORY_CARE_PROVIDER_SITE_OTHER): Payer: Medicare PPO | Admitting: Family

## 2016-06-14 ENCOUNTER — Encounter: Payer: Self-pay | Admitting: Family

## 2016-06-14 VITALS — BP 114/71 | HR 92 | Temp 97.3°F | Ht 66.0 in | Wt 199.0 lb

## 2016-06-14 DIAGNOSIS — R3916 Straining to void: Secondary | ICD-10-CM

## 2016-06-14 DIAGNOSIS — R319 Hematuria, unspecified: Secondary | ICD-10-CM

## 2016-06-14 DIAGNOSIS — N401 Enlarged prostate with lower urinary tract symptoms: Secondary | ICD-10-CM | POA: Diagnosis not present

## 2016-06-14 DIAGNOSIS — R309 Painful micturition, unspecified: Secondary | ICD-10-CM | POA: Diagnosis not present

## 2016-06-14 LAB — URINALYSIS
BILIRUBIN UA: NEGATIVE
GLUCOSE, UA: NEGATIVE
Ketones, UA: NEGATIVE
Nitrite, UA: NEGATIVE
PROTEIN UA: NEGATIVE
Specific Gravity, UA: 1.015 (ref 1.005–1.030)
Urobilinogen, Ur: 0.2 mg/dL (ref 0.2–1.0)
pH, UA: 5.5 (ref 5.0–7.5)

## 2016-06-14 NOTE — Patient Instructions (Signed)

## 2016-06-14 NOTE — Progress Notes (Addendum)
   Subjective:    Patient ID: Corinna GabKenneth Lina, male    DOB: 1934/02/26, 82 y.o.   MRN: [id number]  PT presents to the office today to with dysuria, straining, and flank pain. Pt is followed by Urologists and states he has a follow up appt soon. Pt was placed on alfuzosin over the last week (because he was finishing his Flomax) Dysuria   The current episode started in the past 7 days. The problem occurs intermittently. The problem has been waxing and waning. The quality of the pain is described as aching. The pain is at a severity of 2/10. The pain is mild. Associated symptoms include flank pain, frequency, hesitancy and urgency. Pertinent negatives include no hematuria. He has tried increased fluids for the symptoms. The treatment provided no relief.      Review of Systems  Genitourinary: Positive for dysuria, flank pain, frequency, hesitancy and urgency. Negative for hematuria.  All other systems reviewed and are negative.      Objective:   Physical Exam  Constitutional: He is oriented to person, place, and time. He appears well-developed and well-nourished. No distress.  HENT:  Head: Normocephalic.  Cardiovascular: Normal rate, regular rhythm, normal heart sounds and intact distal pulses.   No murmur heard. Pulmonary/Chest: Effort normal and breath sounds normal. No respiratory distress. He has no wheezes.  Abdominal: Soft. Bowel sounds are normal. He exhibits no distension. There is no tenderness.  Musculoskeletal: Normal range of motion. He exhibits no edema or tenderness.  Negative CVA tenderness   Neurological: He is alert and oriented to person, place, and time.  Skin: Skin is warm and dry. No rash noted. No erythema.  Psychiatric: He has a normal mood and affect. His behavior is normal. Judgment and thought content normal.  Vitals reviewed.     BP 114/71   Pulse 92   Temp 97.3 F (36.3 C) (Oral)   Ht 5\' 6"  (1.676 m)   Wt 199 lb (90.3 kg)   BMI 32.12 kg/m        Assessment & Plan:  1. Painful urination - Urinalysis - Urine culture  2. Hematuria, unspecified type - Urine culture  3. Benign prostatic hyperplasia (BPH) with straining on urination  4. Straining during urination   WBC and Blood present in urine, no bacteria present Will hold on starting on antibiotic at this time, urine culture pending Worrisome for stone? Pt to follow up with Urologists  Continue alfuzosin Force Fluids RTO prn   Jannifer Rodneyhristy Exie Chrismer, FNP

## 2016-06-16 LAB — URINE CULTURE: ORGANISM ID, BACTERIA: NO GROWTH

## 2016-06-27 ENCOUNTER — Ambulatory Visit (INDEPENDENT_AMBULATORY_CARE_PROVIDER_SITE_OTHER): Payer: Medicare PPO | Admitting: Family Medicine

## 2016-06-27 ENCOUNTER — Encounter: Payer: Self-pay | Admitting: Family Medicine

## 2016-06-27 VITALS — BP 117/70 | HR 80 | Temp 97.0°F | Ht 66.0 in | Wt 199.6 lb

## 2016-06-27 DIAGNOSIS — R05 Cough: Secondary | ICD-10-CM | POA: Diagnosis not present

## 2016-06-27 DIAGNOSIS — R058 Other specified cough: Secondary | ICD-10-CM

## 2016-06-27 NOTE — Progress Notes (Signed)
   HPI  Patient presents today here with cough.  Patient splinted after starting the Z-Pak last month he had a change in his cough. He states he did improve after several days with his deep cough. However over the last few weeks he has developed a nagging persistent upper airway cough. Seems to be worse after he gets up for a few hours and also after he eats.  He does not believe it related to GERD, however he does have a history of hiatal hernia.  Patient is taking Zyrtec which seems to be improving some.  PMH: Smoking status noted ROS: Per HPI  Objective: BP 117/70   Pulse 80   Temp 97 F (36.1 C) (Oral)   Ht 5\' 6"  (1.676 m)   Wt 199 lb 9.6 oz (90.5 kg)   BMI 32.22 kg/m  Gen: NAD, alert, cooperative with exam HEENT: NCAT, nares with erythema and boggy mucosa with swollen turbinates, oropharynx clear with some cobblestoning of the oropharynx. CV: RRR, good S1/S2, no murmur Resp: CTABL, no wheezes, non-labored Ext: No edema, warm Neuro: Alert and oriented, No gross deficits  Assessment and plan:  # Upper airway cough syndrome Most consistent with allergic rhinitis and postnasal drip Start Flonase, continue Zyrtec Discussed that if not improving within 2-3 weeks would recommend trial of PPI 3 months. Offer ENT referral for further evaluation for possible GERD related cough Return to clinic with any concerns or worsening symptoms   Murtis SinkSam Bradshaw, MD [facility] 06/27/2016, 4:27 PM

## 2016-06-27 NOTE — Patient Instructions (Signed)
Great to see you!  Start flonase 2 sprays per nostril once daily, if you are not significantly improving within 2 weeks please call and we will start an acid reducing medicine for a time to see if we get good benefit from that.   It is very reasonable to use cough drops as well.

## 2016-06-28 ENCOUNTER — Ambulatory Visit (INDEPENDENT_AMBULATORY_CARE_PROVIDER_SITE_OTHER): Payer: Medicare PPO | Admitting: Urology

## 2016-06-28 DIAGNOSIS — N401 Enlarged prostate with lower urinary tract symptoms: Secondary | ICD-10-CM | POA: Diagnosis not present

## 2016-06-28 DIAGNOSIS — R3121 Asymptomatic microscopic hematuria: Secondary | ICD-10-CM

## 2016-06-28 DIAGNOSIS — N3941 Urge incontinence: Secondary | ICD-10-CM

## 2016-07-09 ENCOUNTER — Ambulatory Visit (INDEPENDENT_AMBULATORY_CARE_PROVIDER_SITE_OTHER): Payer: Medicare PPO | Admitting: Family Medicine

## 2016-07-09 ENCOUNTER — Encounter: Payer: Self-pay | Admitting: Family Medicine

## 2016-07-09 VITALS — BP 122/68 | HR 77 | Temp 97.1°F | Ht 66.0 in | Wt 194.0 lb

## 2016-07-09 DIAGNOSIS — K219 Gastro-esophageal reflux disease without esophagitis: Secondary | ICD-10-CM

## 2016-07-09 DIAGNOSIS — I10 Essential (primary) hypertension: Secondary | ICD-10-CM

## 2016-07-09 DIAGNOSIS — R739 Hyperglycemia, unspecified: Secondary | ICD-10-CM

## 2016-07-09 DIAGNOSIS — E782 Mixed hyperlipidemia: Secondary | ICD-10-CM | POA: Diagnosis not present

## 2016-07-09 DIAGNOSIS — E663 Overweight: Secondary | ICD-10-CM | POA: Diagnosis not present

## 2016-07-09 DIAGNOSIS — N400 Enlarged prostate without lower urinary tract symptoms: Secondary | ICD-10-CM

## 2016-07-09 LAB — BAYER DCA HB A1C WAIVED: HB A1C (BAYER DCA - WAIVED): 6.5 % (ref <7.0)

## 2016-07-09 NOTE — Progress Notes (Signed)
BP 122/68   Pulse 77   Temp 97.1 F (36.2 C) (Oral)   Ht '5\' 6"'  (1.676 m)   Wt 194 lb (88 kg)   BMI 31.31 kg/m    Subjective:    Patient ID: Terry Winters, male    DOB: Oct 03, 1934, 82 y.o.   MRN: [id number]  HPI: Terry Winters is a 82 y.o. male presenting on 07/09/2016 for Hyperlipidemia (6 mo; patient is fasting - 2 tbs. orange juice); Hypertension; and Cough (only after eating)   HPI Hypertension Patient is currently on losartan and, and her blood pressure today is 122/68. Patient denies any lightheadedness or dizziness. Patient denies headaches, blurred vision, chest pains, shortness of breath, or weakness. Denies any side effects from medication and is content with current medication.   Hyperlipidemia Patient is coming in for recheck of his hyperlipidemia. He is currently taking Lipitor. He denies any issues with myalgias or history of liver damage from it. He denies any focal numbness or weakness or chest pain. Patient does know that he is overweight and is focusing on trying to change through small dietary changes.  BPH and possible urinary retention Patient has had long-term histories with BPH and was on tamsulosin but was switched by his urologist just recently 2 alfuzosyn does not feel like he is doing well on these. He does have some pressure there and feels like he has incomplete emptying. He does have an appointment to go back to urology soon. He denies any blood or fevers or chills or dysuria.  Cough after eating and early in the mornings Patient does have a history of GERD, was recently in here for congestion and was treated like allergies/bronchitis and took the azithromycin and has been using Flonase. Most of his chest congestion is gone but he still is having the cough that is residual. He denies any fevers or chills or shortness of breath or wheezing. He denies any abdominal pain but has had a lot of belching and burping. Patient does admit that he used to be on Nexium but  stopped it because of the concern for long-term for his bones.  Relevant past medical, surgical, family and social history reviewed and updated as indicated. Interim medical history since our last visit reviewed. Allergies and medications reviewed and updated.  Review of Systems  Constitutional: Negative for chills and fever.  Respiratory: Positive for cough. Negative for chest tightness, shortness of breath and wheezing.   Cardiovascular: Negative for chest pain and leg swelling.  Gastrointestinal: Negative for abdominal distention and abdominal pain.  Musculoskeletal: Negative for back pain and gait problem.  Skin: Negative for rash.  Neurological: Negative for dizziness, weakness and numbness.  All other systems reviewed and are negative.   Per HPI unless specifically indicated above     Objective:    BP 122/68   Pulse 77   Temp 97.1 F (36.2 C) (Oral)   Ht '5\' 6"'  (1.676 m)   Wt 194 lb (88 kg)   BMI 31.31 kg/m   Wt Readings from Last 3 Encounters:  07/09/16 194 lb (88 kg)  06/27/16 199 lb 9.6 oz (90.5 kg)  06/14/16 199 lb (90.3 kg)    Physical Exam  Constitutional: He is oriented to person, place, and time. He appears well-developed and well-nourished. No distress.  HENT:  Right Ear: External ear normal.  Left Ear: External ear normal.  Nose: Nose normal.  Mouth/Throat: Oropharynx is clear and moist. No oropharyngeal exudate.  Eyes: Conjunctivae are normal. No  scleral icterus.  Neck: Neck supple.  Cardiovascular: Normal rate, regular rhythm, normal heart sounds and intact distal pulses.   No murmur heard. Pulmonary/Chest: Effort normal and breath sounds normal. No respiratory distress. He has no wheezes. He has no rales.  Abdominal: Soft. Bowel sounds are normal. He exhibits no distension. There is no tenderness. There is no rebound.  Musculoskeletal: Normal range of motion. He exhibits no edema.  Lymphadenopathy:    He has no cervical adenopathy.  Neurological: He  is alert and oriented to person, place, and time. Coordination normal.  Skin: Skin is warm and dry. No rash noted. He is not diaphoretic.  Psychiatric: He has a normal mood and affect. His behavior is normal.  Nursing note and vitals reviewed.       Assessment & Plan:   Problem List Items Addressed This Visit      Cardiovascular and Mediastinum   HTN (hypertension)   Relevant Orders   CMP14+EGFR     Digestive   GERD (gastroesophageal reflux disease)     Genitourinary   BPH (benign prostatic hyperplasia)   Relevant Orders   PSA, total and free     Other   HLD (hyperlipidemia)   Relevant Orders   Lipid panel   Hyperglycemia - Primary   Relevant Orders   Bayer DCA Hb A1c Waived   CMP14+EGFR   Overweight   Relevant Orders   Lipid panel       Follow up plan: Return in about 3 months (around 10/09/2016), or if symptoms worsen or fail to improve, for Hypertension and cholesterol recheck.  Counseling provided for all of the vaccine components Orders Placed This Encounter  Procedures  . Bayer DCA Hb A1c Waived  . CMP14+EGFR  . Lipid panel  . PSA, total and free    Caryl Pina, MD [facility] 07/09/2016, 9:25 AM

## 2016-07-10 ENCOUNTER — Other Ambulatory Visit: Payer: Self-pay | Admitting: Family

## 2016-07-10 LAB — CMP14+EGFR
ALK PHOS: 84 IU/L (ref 39–117)
ALT: 36 IU/L (ref 0–44)
AST: 28 IU/L (ref 0–40)
Albumin/Globulin Ratio: 1.1 — ABNORMAL LOW (ref 1.2–2.2)
Albumin: 3.3 g/dL — ABNORMAL LOW (ref 3.5–4.8)
BUN/Creatinine Ratio: 11 (ref 10–24)
BUN: 10 mg/dL (ref 8–27)
Bilirubin Total: 0.4 mg/dL (ref 0.0–1.2)
CO2: 23 mmol/L (ref 18–29)
CREATININE: 0.87 mg/dL (ref 0.76–1.27)
Calcium: 8.8 mg/dL (ref 8.6–10.2)
Chloride: 100 mmol/L (ref 96–106)
GFR calc Af Amer: 95 mL/min/{1.73_m2} (ref >59)
GFR calc non Af Amer: 82 mL/min/{1.73_m2} (ref >59)
GLOBULIN, TOTAL: 3 g/dL (ref 1.5–4.5)
GLUCOSE: 126 mg/dL — AB (ref 65–99)
Potassium: 4.4 mmol/L (ref 3.5–5.2)
SODIUM: 140 mmol/L (ref 134–144)
Total Protein: 6.3 g/dL (ref 6.0–8.5)

## 2016-07-10 LAB — LIPID PANEL
CHOLESTEROL TOTAL: 100 mg/dL (ref 100–199)
Chol/HDL Ratio: 3.3 ratio (ref 0.0–5.0)
HDL: 30 mg/dL — ABNORMAL LOW (ref >39)
LDL CALC: 54 mg/dL (ref 0–99)
TRIGLYCERIDES: 80 mg/dL (ref 0–149)
VLDL Cholesterol Cal: 16 mg/dL (ref 5–40)

## 2016-07-10 LAB — PSA, TOTAL AND FREE
PSA FREE PCT: 21 %
PSA, Free: 0.65 ng/mL
Prostate Specific Ag, Serum: 3.1 ng/mL (ref 0.0–4.0)

## 2016-08-28 ENCOUNTER — Encounter: Payer: Self-pay | Admitting: Family Medicine

## 2016-08-28 ENCOUNTER — Ambulatory Visit (INDEPENDENT_AMBULATORY_CARE_PROVIDER_SITE_OTHER): Payer: Medicare PPO | Admitting: Family Medicine

## 2016-08-28 VITALS — BP 137/75 | HR 90 | Temp 98.3°F | Ht 66.0 in | Wt 192.8 lb

## 2016-08-28 DIAGNOSIS — R339 Retention of urine, unspecified: Secondary | ICD-10-CM

## 2016-08-28 MED ORDER — CIPROFLOXACIN HCL 500 MG PO TABS: 500.0000 mg | ORAL_TABLET | Freq: Two times a day (BID) | ORAL | 0 refills | Status: DC

## 2016-08-28 NOTE — Progress Notes (Signed)
   Subjective:    Patient ID: Terry Winters, male    DOB: 1934-12-24, 82 y.o.   MRN: [id number]  HPI  Patient Active Problem List   Diagnosis Date Noted  . GERD (gastroesophageal reflux disease) 03/02/2015  . Erectile dysfunction 10/15/2012  . Gout   . Vitamin D deficiency   . Vitiligo 07/16/2012  . Overweight 07/16/2012  . Post herpetic neuralgia 07/16/2012  . H/O: gout 07/16/2012  . HTN (hypertension) 06/15/2012  . HLD (hyperlipidemia) 06/15/2012  . Hyperglycemia 06/15/2012  . BPH (benign prostatic hyperplasia) 06/15/2012  . Postherpetic neuralgia 06/15/2012  . Genu varus 06/15/2012   Outpatient Encounter Prescriptions as of 08/28/2016  Medication Sig  . alfuzosin (UROXATRAL) 10 MG 24 hr tablet Take 10 mg by mouth daily with breakfast.  . aspirin 81 MG tablet Take 81 mg by mouth daily.  Marland Kitchen. atorvastatin (LIPITOR) 20 MG tablet TAKE 1 TABLET EVERY DAY  AT  6PM  . cholecalciferol (VITAMIN D) 1000 UNITS tablet Take 2,000 Units by mouth daily.  . fish oil-omega-3 fatty acids 1000 MG capsule Take 3 g by mouth daily.  Marland Kitchen. losartan (COZAAR) 100 MG tablet TAKE 1 TABLET (100 MG TOTAL) BY MOUTH DAILY.  Marland Kitchen. losartan (COZAAR) 100 MG tablet TAKE 1 TABLET (100 MG TOTAL) BY MOUTH DAILY.  Marland Kitchen. LUTEIN PO Take 1 tablet by mouth.  . Multiple Vitamins-Minerals (ZINC PO) Take 1 tablet by mouth.  . zolpidem (AMBIEN) 10 MG tablet Take 1 tablet (10 mg total) by mouth at bedtime as needed for sleep.   No facility-administered encounter medications on file as of 08/28/2016.       Review of Systems     Objective:   Physical Exam BP 137/75   Pulse 90   Temp 98.3 F (36.8 C) (Oral)   Ht 5\' 6"  (1.676 m)   Wt 192 lb 12.8 oz (87.5 kg)   BMI 31.12 kg/m         Assessment & Plan:

## 2016-08-28 NOTE — Progress Notes (Signed)
   Subjective:    Patient ID: Terry Winters, male    DOB: [birth year], 82 y.o.   MRN: [id number]  HPI Patient is here today for urinary retention, dysuria,  frequent urination, and abdominal pain. Patient usually sees a urologist for his problems. Sounds like he may have struggled with chronic prostatitis over the years. There is a remote history of stone disease. He denies any fever or chills does have some left flank pain.   Review of Systems  Constitutional: Negative.   HENT: Negative.   Eyes: Negative.   Respiratory: Negative.   Cardiovascular: Negative.   Gastrointestinal: Positive for abdominal pain.  Endocrine: Negative.   Genitourinary: Positive for difficulty urinating, dysuria and frequency.  Musculoskeletal: Negative.   Skin: Negative.   Allergic/Immunologic: Negative.   Neurological: Negative.   Hematological: Negative.   Psychiatric/Behavioral: Negative.          Patient Active Problem List   Diagnosis Date Noted  . GERD (gastroesophageal reflux disease) 03/02/2015  . Erectile dysfunction 10/15/2012  . Gout   . Vitamin D deficiency   . Vitiligo 07/16/2012  . Overweight 07/16/2012  . Post herpetic neuralgia 07/16/2012  . H/O: gout 07/16/2012  . HTN (hypertension) 06/15/2012  . HLD (hyperlipidemia) 06/15/2012  . Hyperglycemia 06/15/2012  . BPH (benign prostatic hyperplasia) 06/15/2012  . Postherpetic neuralgia 06/15/2012  . Genu varus 06/15/2012   Outpatient Encounter Prescriptions as of 08/28/2016  Medication Sig  . alfuzosin (UROXATRAL) 10 MG 24 hr tablet Take 10 mg by mouth daily with breakfast.  . aspirin 81 MG tablet Take 81 mg by mouth daily.  Marland Kitchen. atorvastatin (LIPITOR) 20 MG tablet TAKE 1 TABLET EVERY DAY  AT  6PM  . cholecalciferol (VITAMIN D) 1000 UNITS tablet Take 2,000 Units by mouth daily.  . fish oil-omega-3 fatty acids 1000 MG capsule Take 3 g by mouth daily.  Marland Kitchen. losartan (COZAAR) 100 MG tablet TAKE 1 TABLET (100 MG TOTAL) BY MOUTH DAILY.  Marland Kitchen.  LUTEIN PO Take 1 tablet by mouth.  . Multiple Vitamins-Minerals (ZINC PO) Take 1 tablet by mouth.  . zolpidem (AMBIEN) 10 MG tablet Take 1 tablet (10 mg total) by mouth at bedtime as needed for sleep.  . [DISCONTINUED] losartan (COZAAR) 100 MG tablet TAKE 1 TABLET (100 MG TOTAL) BY MOUTH DAILY.   No facility-administered encounter medications on file as of 08/28/2016.        Objective:   Physical Exam  Constitutional: He appears well-developed and well-nourished.  Abdominal: Soft.  No CVAT  Genitourinary: Prostate normal.  Neurological: He is alert.   BP 137/75   Pulse 90   Temp 98.3 F (36.8 C) (Oral)   Ht 5\' 6"  (1.676 m)   Wt 192 lb 12.8 oz (87.5 kg)   BMI 31.12 kg/m         Assessment & Plan:  Urinary retention. Questionable chronic prostatitis. Gross exam of the urine appears to be cloudy suggestive of pyuria. Patient did not provide large enough sample to do urinalysis and culture, so I opted for culture. Begin Cipro 500 mg twice a day pending result of culture

## 2016-08-28 NOTE — Addendum Note (Signed)
Addended by: Tamera PuntWRAY, Chanae Gemma S on: 08/28/2016 04:32 PM   Modules accepted: Orders

## 2016-08-29 ENCOUNTER — Other Ambulatory Visit: Payer: Self-pay | Admitting: Family

## 2016-08-29 LAB — URINE CULTURE

## 2016-09-11 ENCOUNTER — Ambulatory Visit (INDEPENDENT_AMBULATORY_CARE_PROVIDER_SITE_OTHER): Payer: Medicare PPO | Admitting: Family Medicine

## 2016-09-11 ENCOUNTER — Encounter: Payer: Self-pay | Admitting: Family Medicine

## 2016-09-11 VITALS — BP 137/73 | HR 74 | Temp 98.0°F | Ht 66.0 in | Wt 193.0 lb

## 2016-09-11 DIAGNOSIS — R350 Frequency of micturition: Secondary | ICD-10-CM | POA: Diagnosis not present

## 2016-09-11 DIAGNOSIS — R1030 Lower abdominal pain, unspecified: Secondary | ICD-10-CM | POA: Diagnosis not present

## 2016-09-11 LAB — URINALYSIS
BILIRUBIN UA: NEGATIVE
GLUCOSE, UA: NEGATIVE
Ketones, UA: NEGATIVE
Nitrite, UA: NEGATIVE
PROTEIN UA: NEGATIVE
Urobilinogen, Ur: 0.2 mg/dL (ref 0.2–1.0)
pH, UA: 5.5 (ref 5.0–7.5)

## 2016-09-11 MED ORDER — DOXYCYCLINE MONOHYDRATE 100 MG PO TABS: 100.0000 mg | ORAL_TABLET | Freq: Two times a day (BID) | ORAL | 0 refills | Status: DC

## 2016-09-11 NOTE — Progress Notes (Signed)
BP 137/73   Pulse 74   Temp 98 F (36.7 C) (Oral)   Ht 5\' 6"  (1.676 m)   Wt 193 lb (87.5 kg)   BMI 31.15 kg/m    Subjective:    Patient ID: Terry GabKenneth Vandezande, male    DOB: Jul 01, 1934, 82 y.o.   MRN: [id number]  HPI: Terry Winters is a 82 y.o. male presenting on 09/11/2016 for Edema in ankles and Urinary Tract Infection (urinary frequency, hesitancy)   HPI Urinary frequency and hesitancy and constipation Patient comes in complaining of urinary frequency and hesitancy and constipation that all been bothering him for the past couple weeks. He did finish an antibiotic which did help but feels like the symptoms are coming back. He has noticed that his been a lot of more constipated but finally did have a bowel movement that was good the day before yesterday. He denies any fevers or chills or flank pain. He denies any blood in his urine. He does have some hesitancy and frequency and some dysuria.  Relevant past medical, surgical, family and social history reviewed and updated as indicated. Interim medical history since our last visit reviewed. Allergies and medications reviewed and updated.  Review of Systems  Constitutional: Negative for chills and fever.  Eyes: Negative for discharge.  Respiratory: Negative for shortness of breath and wheezing.   Cardiovascular: Negative for chest pain and leg swelling.  Gastrointestinal: Positive for abdominal pain and constipation. Negative for blood in stool, diarrhea, nausea and vomiting.  Genitourinary: Positive for dysuria, hematuria and urgency. Negative for decreased urine volume and flank pain.  Musculoskeletal: Negative for back pain and gait problem.  Skin: Negative for rash.  All other systems reviewed and are negative.   Per HPI unless specifically indicated above        Objective:    BP 137/73   Pulse 74   Temp 98 F (36.7 C) (Oral)   Ht 5\' 6"  (1.676 m)   Wt 193 lb (87.5 kg)   BMI 31.15 kg/m   Wt Readings from Last 3  Encounters:  09/11/16 193 lb (87.5 kg)  08/28/16 192 lb 12.8 oz (87.5 kg)  07/09/16 194 lb (88 kg)    Physical Exam  Constitutional: He is oriented to person, place, and time. He appears well-developed and well-nourished. No distress.  Eyes: Conjunctivae are normal. No scleral icterus.  Cardiovascular: Normal rate, regular rhythm, normal heart sounds and intact distal pulses.   No murmur heard. Pulmonary/Chest: Effort normal and breath sounds normal. No respiratory distress. He has no wheezes.  Abdominal: Soft. Bowel sounds are normal. He exhibits mass (Patient has difficult time relaxing his abdominal muscles but there is a possible palpable mass in lower abdomen versus flexed abdominal muscles). He exhibits no distension. There is no hepatosplenomegaly. There is tenderness in the suprapubic area. There is no rigidity, no rebound, no guarding and no CVA tenderness.  Musculoskeletal: Normal range of motion. He exhibits no edema.  Neurological: He is alert and oriented to person, place, and time. Coordination normal.  Skin: Skin is warm and dry. No rash noted. He is not diaphoretic.  Psychiatric: He has a normal mood and affect. His behavior is normal.  Nursing note and vitals reviewed.       Assessment & Plan:   Problem List Items Addressed This Visit    None    Visit Diagnoses    Urinary frequency    -  Primary   Relevant Medications   doxycycline (ADOXA) 100  MG tablet   Other Relevant Orders   Urinalysis   Urine Culture   Lower abdominal pain       Patient has lower abdominal pain and possible palpable mass versus voluntary guarding   Relevant Medications   doxycycline (ADOXA) 100 MG tablet   Other Relevant Orders   CT Abdomen Pelvis W Contrast   Urine Culture       Follow up plan: Return if symptoms worsen or fail to improve.  Counseling provided for all of the vaccine components Orders Placed This Encounter  Procedures  . Urine Culture  . CT Abdomen Pelvis W  Contrast  . Urinalysis    Arville CareJoshua Dettinger, MD [facility] 09/11/2016, 5:53 PM

## 2016-09-14 LAB — URINE CULTURE: Organism ID, Bacteria: NO GROWTH

## 2016-09-20 ENCOUNTER — Other Ambulatory Visit: Payer: Self-pay | Admitting: *Deleted

## 2016-09-20 ENCOUNTER — Ambulatory Visit (HOSPITAL_COMMUNITY)
Admission: RE | Admit: 2016-09-20 | Discharge: 2016-09-20 | Disposition: A | Discharge status: Home / Self Care | Payer: Medicare PPO | Source: Ambulatory Visit | Attending: Family Medicine | Admitting: Family Medicine

## 2016-09-20 DIAGNOSIS — R109 Unspecified abdominal pain: Secondary | ICD-10-CM | POA: Diagnosis not present

## 2016-09-20 DIAGNOSIS — N134 Hydroureter: Secondary | ICD-10-CM | POA: Insufficient documentation

## 2016-09-20 DIAGNOSIS — M47896 Other spondylosis, lumbar region: Secondary | ICD-10-CM | POA: Diagnosis not present

## 2016-09-20 DIAGNOSIS — K8020 Calculus of gallbladder without cholecystitis without obstruction: Secondary | ICD-10-CM | POA: Diagnosis not present

## 2016-09-20 DIAGNOSIS — I700 Atherosclerosis of aorta: Secondary | ICD-10-CM | POA: Diagnosis not present

## 2016-09-20 DIAGNOSIS — N3289 Other specified disorders of bladder: Secondary | ICD-10-CM | POA: Diagnosis not present

## 2016-09-20 DIAGNOSIS — N1330 Unspecified hydronephrosis: Secondary | ICD-10-CM | POA: Diagnosis not present

## 2016-09-20 DIAGNOSIS — R1030 Lower abdominal pain, unspecified: Secondary | ICD-10-CM | POA: Diagnosis not present

## 2016-09-20 DIAGNOSIS — N320 Bladder-neck obstruction: Secondary | ICD-10-CM

## 2016-09-20 LAB — POCT I-STAT CREATININE: Creatinine, Ser: 1.6 mg/dL — ABNORMAL HIGH (ref 0.61–1.24)

## 2016-09-20 MED ORDER — IOPAMIDOL (ISOVUE-300) INJECTION 61%
100.0000 mL | Freq: Once | INTRAVENOUS | Status: AC | PRN
  Administered 2016-09-20: 75 mL via INTRAVENOUS

## 2016-09-27 ENCOUNTER — Other Ambulatory Visit (HOSPITAL_COMMUNITY)
Admission: RE | Admit: 2016-09-27 | Discharge: 2016-09-27 | Disposition: A | Discharge status: Home / Self Care | Payer: Medicare PPO | Source: Other Acute Inpatient Hospital | Attending: Urology | Admitting: Urology

## 2016-09-27 ENCOUNTER — Ambulatory Visit (INDEPENDENT_AMBULATORY_CARE_PROVIDER_SITE_OTHER): Payer: Medicare PPO | Admitting: Urology

## 2016-09-27 DIAGNOSIS — R338 Other retention of urine: Secondary | ICD-10-CM

## 2016-09-27 DIAGNOSIS — N401 Enlarged prostate with lower urinary tract symptoms: Secondary | ICD-10-CM

## 2016-09-27 DIAGNOSIS — N183 Chronic kidney disease, stage 3 (moderate): Secondary | ICD-10-CM

## 2016-09-27 LAB — URINALYSIS, COMPLETE (UACMP) WITH MICROSCOPIC
BACTERIA UA: NONE SEEN
BILIRUBIN URINE: NEGATIVE
Glucose, UA: NEGATIVE mg/dL
KETONES UR: NEGATIVE mg/dL
LEUKOCYTES UA: NEGATIVE
NITRITE: NEGATIVE
PROTEIN: NEGATIVE mg/dL
Specific Gravity, Urine: 1.005 (ref 1.005–1.030)
pH: 5 (ref 5.0–8.0)

## 2016-09-29 LAB — URINE CULTURE: CULTURE: NO GROWTH

## 2016-10-15 ENCOUNTER — Other Ambulatory Visit: Payer: Self-pay | Admitting: Urology

## 2016-10-15 ENCOUNTER — Ambulatory Visit: Payer: Medicare PPO | Admitting: Urology

## 2016-11-12 ENCOUNTER — Encounter (HOSPITAL_BASED_OUTPATIENT_CLINIC_OR_DEPARTMENT_OTHER): Payer: Self-pay | Admitting: *Deleted

## 2016-11-12 NOTE — Progress Notes (Addendum)
SPOKE W/ PT VIA PHONE.  NPO AFTER MN W/ EXCEPTION CLEAR UNTIL 0630 (NO CREAM /MILK PRODUCTS).  ARRIVE AT 1030.  NEEDS ISTAT 8 AND EKG.

## 2016-11-14 ENCOUNTER — Encounter (HOSPITAL_BASED_OUTPATIENT_CLINIC_OR_DEPARTMENT_OTHER): Payer: Self-pay | Admitting: Anesthesiology

## 2016-11-14 ENCOUNTER — Ambulatory Visit (HOSPITAL_BASED_OUTPATIENT_CLINIC_OR_DEPARTMENT_OTHER): Payer: Medicare PPO | Admitting: Anesthesiology

## 2016-11-14 ENCOUNTER — Other Ambulatory Visit: Payer: Self-pay

## 2016-11-14 ENCOUNTER — Observation Stay (HOSPITAL_BASED_OUTPATIENT_CLINIC_OR_DEPARTMENT_OTHER)
Admission: RE | Admit: 2016-11-14 | Discharge: 2016-11-15 | Disposition: A | Discharge status: Home / Self Care | Payer: Medicare PPO | Source: Ambulatory Visit | Attending: Urology | Admitting: Urology

## 2016-11-14 ENCOUNTER — Encounter (HOSPITAL_BASED_OUTPATIENT_CLINIC_OR_DEPARTMENT_OTHER)
Admission: RE | Disposition: A | Discharge status: Home / Self Care | Payer: Self-pay | Source: Ambulatory Visit | Attending: Urology

## 2016-11-14 DIAGNOSIS — N401 Enlarged prostate with lower urinary tract symptoms: Principal | ICD-10-CM | POA: Insufficient documentation

## 2016-11-14 DIAGNOSIS — R338 Other retention of urine: Secondary | ICD-10-CM | POA: Diagnosis not present

## 2016-11-14 DIAGNOSIS — N183 Chronic kidney disease, stage 3 (moderate): Secondary | ICD-10-CM | POA: Insufficient documentation

## 2016-11-14 DIAGNOSIS — Z888 Allergy status to other drugs, medicaments and biological substances status: Secondary | ICD-10-CM | POA: Diagnosis not present

## 2016-11-14 DIAGNOSIS — I129 Hypertensive chronic kidney disease with stage 1 through stage 4 chronic kidney disease, or unspecified chronic kidney disease: Secondary | ICD-10-CM | POA: Diagnosis not present

## 2016-11-14 DIAGNOSIS — R339 Retention of urine, unspecified: Secondary | ICD-10-CM | POA: Diagnosis not present

## 2016-11-14 DIAGNOSIS — Z87891 Personal history of nicotine dependence: Secondary | ICD-10-CM | POA: Insufficient documentation

## 2016-11-14 DIAGNOSIS — Z7982 Long term (current) use of aspirin: Secondary | ICD-10-CM | POA: Diagnosis not present

## 2016-11-14 DIAGNOSIS — E7800 Pure hypercholesterolemia, unspecified: Secondary | ICD-10-CM | POA: Insufficient documentation

## 2016-11-14 DIAGNOSIS — M109 Gout, unspecified: Secondary | ICD-10-CM | POA: Insufficient documentation

## 2016-11-14 DIAGNOSIS — N138 Other obstructive and reflux uropathy: Secondary | ICD-10-CM | POA: Diagnosis present

## 2016-11-14 DIAGNOSIS — I10 Essential (primary) hypertension: Secondary | ICD-10-CM | POA: Diagnosis not present

## 2016-11-14 DIAGNOSIS — Z79899 Other long term (current) drug therapy: Secondary | ICD-10-CM | POA: Diagnosis not present

## 2016-11-14 DIAGNOSIS — E119 Type 2 diabetes mellitus without complications: Secondary | ICD-10-CM | POA: Diagnosis not present

## 2016-11-14 HISTORY — DX: Other seasonal allergic rhinitis: J30.2

## 2016-11-14 HISTORY — DX: Type 2 diabetes mellitus without complications: E11.9

## 2016-11-14 HISTORY — DX: Presence of other specified devices: Z97.8

## 2016-11-14 HISTORY — DX: Personal history of other diseases of the musculoskeletal system and connective tissue: Z87.39

## 2016-11-14 HISTORY — PX: TRANSURETHRAL RESECTION OF PROSTATE: SHX73

## 2016-11-14 HISTORY — DX: Presence of spectacles and contact lenses: Z97.3

## 2016-11-14 HISTORY — DX: Gastro-esophageal reflux disease without esophagitis: K21.9

## 2016-11-14 HISTORY — DX: Retention of urine, unspecified: R33.9

## 2016-11-14 HISTORY — DX: Presence of dental prosthetic device (complete) (partial): Z97.2

## 2016-11-14 HISTORY — DX: Presence of urogenital implants: Z96.0

## 2016-11-14 LAB — POCT I-STAT, CHEM 8
BUN: 9 mg/dL (ref 6–20)
CALCIUM ION: 1.19 mmol/L (ref 1.15–1.40)
CREATININE: 0.8 mg/dL (ref 0.61–1.24)
Chloride: 101 mmol/L (ref 101–111)
Glucose, Bld: 115 mg/dL — ABNORMAL HIGH (ref 65–99)
HEMATOCRIT: 43 % (ref 39.0–52.0)
HEMOGLOBIN: 14.6 g/dL (ref 13.0–17.0)
Potassium: 3.7 mmol/L (ref 3.5–5.1)
SODIUM: 140 mmol/L (ref 135–145)
TCO2: 25 mmol/L (ref 22–32)

## 2016-11-14 SURGERY — TURP (TRANSURETHRAL RESECTION OF PROSTATE)
Anesthesia: General | Site: Prostate

## 2016-11-14 MED ORDER — DEXAMETHASONE SODIUM PHOSPHATE 10 MG/ML IJ SOLN
INTRAMUSCULAR | Status: AC
  Filled 2016-11-14: qty 1

## 2016-11-14 MED ORDER — LORATADINE 10 MG PO TABS
10.0000 mg | ORAL_TABLET | Freq: Every day | ORAL | Status: DC
  Filled 2016-11-14: qty 1

## 2016-11-14 MED ORDER — HYDROCODONE-ACETAMINOPHEN 5-325 MG PO TABS
1.0000 | ORAL_TABLET | ORAL | Status: DC | PRN
  Administered 2016-11-14 – 2016-11-15 (×3): 1 via ORAL
  Filled 2016-11-14: qty 2

## 2016-11-14 MED ORDER — FENTANYL CITRATE (PF) 100 MCG/2ML IJ SOLN
INTRAMUSCULAR | Status: AC
  Filled 2016-11-14: qty 2

## 2016-11-14 MED ORDER — ATORVASTATIN CALCIUM 20 MG PO TABS
20.0000 mg | ORAL_TABLET | Freq: Every day | ORAL | Status: DC
  Filled 2016-11-14: qty 1

## 2016-11-14 MED ORDER — SUCCINYLCHOLINE CHLORIDE 200 MG/10ML IV SOSY
PREFILLED_SYRINGE | INTRAVENOUS | Status: DC | PRN
  Administered 2016-11-14: 120 mg via INTRAVENOUS

## 2016-11-14 MED ORDER — ZOLPIDEM TARTRATE 5 MG PO TABS
5.0000 mg | ORAL_TABLET | Freq: Every evening | ORAL | Status: DC | PRN
  Filled 2016-11-14: qty 1

## 2016-11-14 MED ORDER — SODIUM CHLORIDE 0.9 % IR SOLN
3000.0000 mL | Status: DC
  Filled 2016-11-14: qty 3000

## 2016-11-14 MED ORDER — ONDANSETRON HCL 4 MG/2ML IJ SOLN
4.0000 mg | INTRAMUSCULAR | Status: DC | PRN
  Filled 2016-11-14: qty 2

## 2016-11-14 MED ORDER — HYDROMORPHONE HCL 1 MG/ML IJ SOLN
0.5000 mg | INTRAMUSCULAR | Status: DC | PRN
  Filled 2016-11-14: qty 1

## 2016-11-14 MED ORDER — LIDOCAINE 2% (20 MG/ML) 5 ML SYRINGE
INTRAMUSCULAR | Status: AC
  Filled 2016-11-14: qty 5

## 2016-11-14 MED ORDER — FENTANYL CITRATE (PF) 100 MCG/2ML IJ SOLN
INTRAMUSCULAR | Status: DC | PRN
  Administered 2016-11-14 (×2): 50 ug via INTRAVENOUS

## 2016-11-14 MED ORDER — MEPERIDINE HCL 25 MG/ML IJ SOLN
6.2500 mg | INTRAMUSCULAR | Status: DC | PRN
  Filled 2016-11-14: qty 1

## 2016-11-14 MED ORDER — SUCCINYLCHOLINE CHLORIDE 200 MG/10ML IV SOSY
PREFILLED_SYRINGE | INTRAVENOUS | Status: AC
  Filled 2016-11-14: qty 10

## 2016-11-14 MED ORDER — CEPHALEXIN 500 MG PO CAPS
500.0000 mg | ORAL_CAPSULE | Freq: Three times a day (TID) | ORAL | Status: DC
  Administered 2016-11-14 – 2016-11-15 (×3): 500 mg via ORAL
  Filled 2016-11-14 (×4): qty 1

## 2016-11-14 MED ORDER — HYDROCODONE-ACETAMINOPHEN 5-325 MG PO TABS
ORAL_TABLET | ORAL | Status: AC
  Filled 2016-11-14: qty 1

## 2016-11-14 MED ORDER — GENTAMICIN SULFATE 40 MG/ML IJ SOLN
5.0000 mg/kg | INTRAVENOUS | Status: DC
  Filled 2016-11-14: qty 11

## 2016-11-14 MED ORDER — BISACODYL 10 MG RE SUPP
10.0000 mg | Freq: Every day | RECTAL | Status: DC | PRN
  Filled 2016-11-14: qty 1

## 2016-11-14 MED ORDER — FINASTERIDE 5 MG PO TABS
5.0000 mg | ORAL_TABLET | Freq: Every morning | ORAL | Status: DC
  Filled 2016-11-14: qty 1

## 2016-11-14 MED ORDER — ONDANSETRON HCL 4 MG/2ML IJ SOLN
INTRAMUSCULAR | Status: AC
  Filled 2016-11-14: qty 2

## 2016-11-14 MED ORDER — SENNOSIDES-DOCUSATE SODIUM 8.6-50 MG PO TABS
1.0000 | ORAL_TABLET | Freq: Every evening | ORAL | Status: DC | PRN
  Filled 2016-11-14: qty 1

## 2016-11-14 MED ORDER — ONDANSETRON HCL 4 MG/2ML IJ SOLN
INTRAMUSCULAR | Status: DC | PRN
  Administered 2016-11-14: 4 mg via INTRAVENOUS

## 2016-11-14 MED ORDER — DOCUSATE SODIUM 100 MG PO CAPS
100.0000 mg | ORAL_CAPSULE | Freq: Two times a day (BID) | ORAL | Status: DC
  Administered 2016-11-14 – 2016-11-15 (×2): 100 mg via ORAL
  Filled 2016-11-14 (×2): qty 1

## 2016-11-14 MED ORDER — LIDOCAINE 2% (20 MG/ML) 5 ML SYRINGE
INTRAMUSCULAR | Status: DC | PRN
  Administered 2016-11-14: 60 mg via INTRAVENOUS

## 2016-11-14 MED ORDER — OXYBUTYNIN CHLORIDE 5 MG PO TABS
5.0000 mg | ORAL_TABLET | Freq: Three times a day (TID) | ORAL | Status: DC | PRN
  Filled 2016-11-14: qty 1

## 2016-11-14 MED ORDER — LORAZEPAM 0.5 MG PO TABS
0.5000 mg | ORAL_TABLET | Freq: Four times a day (QID) | ORAL | Status: DC | PRN
  Filled 2016-11-14: qty 1

## 2016-11-14 MED ORDER — ROCURONIUM BROMIDE 50 MG/5ML IV SOSY
PREFILLED_SYRINGE | INTRAVENOUS | Status: AC
  Filled 2016-11-14: qty 5

## 2016-11-14 MED ORDER — DEXAMETHASONE SODIUM PHOSPHATE 10 MG/ML IJ SOLN
INTRAMUSCULAR | Status: DC | PRN
  Administered 2016-11-14: 10 mg via INTRAVENOUS

## 2016-11-14 MED ORDER — DOCUSATE SODIUM 100 MG PO CAPS
ORAL_CAPSULE | ORAL | Status: AC
  Filled 2016-11-14: qty 1

## 2016-11-14 MED ORDER — CEFAZOLIN SODIUM-DEXTROSE 2-4 GM/100ML-% IV SOLN
INTRAVENOUS | Status: AC
  Filled 2016-11-14: qty 100

## 2016-11-14 MED ORDER — LACTATED RINGERS IV SOLN
INTRAVENOUS | Status: DC
  Administered 2016-11-14: 11:00:00 via INTRAVENOUS
  Filled 2016-11-14: qty 1000

## 2016-11-14 MED ORDER — PROPOFOL 10 MG/ML IV BOLUS
INTRAVENOUS | Status: DC | PRN
Start: 2016-11-14 — End: 2016-11-14
  Administered 2016-11-14: 140 mg via INTRAVENOUS
  Administered 2016-11-14: 50 mg via INTRAVENOUS
  Administered 2016-11-14: 60 mg via INTRAVENOUS

## 2016-11-14 MED ORDER — CEPHALEXIN 250 MG PO CAPS: 250.0000 mg | ORAL_CAPSULE | Freq: Three times a day (TID) | ORAL | 0 refills | Status: AC

## 2016-11-14 MED ORDER — MENTHOL 3 MG MT LOZG
LOZENGE | OROMUCOSAL | Status: AC
  Filled 2016-11-14: qty 9

## 2016-11-14 MED ORDER — DIPHENHYDRAMINE HCL 12.5 MG/5ML PO ELIX
12.5000 mg | ORAL_SOLUTION | Freq: Four times a day (QID) | ORAL | Status: DC | PRN
  Filled 2016-11-14: qty 5

## 2016-11-14 MED ORDER — ROCURONIUM BROMIDE 50 MG/5ML IV SOSY
PREFILLED_SYRINGE | INTRAVENOUS | Status: DC | PRN
  Administered 2016-11-14: 40 mg via INTRAVENOUS

## 2016-11-14 MED ORDER — SODIUM CHLORIDE 0.9 % IR SOLN
Status: DC | PRN
  Administered 2016-11-14: 15000 mL

## 2016-11-14 MED ORDER — DOCUSATE SODIUM 100 MG PO CAPS: 100.0000 mg | ORAL_CAPSULE | Freq: Two times a day (BID) | ORAL | 2 refills | Status: DC

## 2016-11-14 MED ORDER — KCL IN DEXTROSE-NACL 20-5-0.45 MEQ/L-%-% IV SOLN
INTRAVENOUS | Status: DC
  Administered 2016-11-14: 16:00:00 via INTRAVENOUS
  Filled 2016-11-14 (×3): qty 1000

## 2016-11-14 MED ORDER — FENTANYL CITRATE (PF) 100 MCG/2ML IJ SOLN
25.0000 ug | INTRAMUSCULAR | Status: DC | PRN
  Administered 2016-11-14 (×2): 50 ug via INTRAVENOUS
  Filled 2016-11-14: qty 1

## 2016-11-14 MED ORDER — ACETAMINOPHEN 325 MG PO TABS
650.0000 mg | ORAL_TABLET | ORAL | Status: DC | PRN
  Filled 2016-11-14: qty 2

## 2016-11-14 MED ORDER — DIPHENHYDRAMINE HCL 50 MG/ML IJ SOLN
12.5000 mg | Freq: Four times a day (QID) | INTRAMUSCULAR | Status: DC | PRN
  Filled 2016-11-14: qty 0.25

## 2016-11-14 MED ORDER — GENTAMICIN SULFATE 40 MG/ML IJ SOLN
5.0000 mg/kg | INTRAVENOUS | Status: AC
  Administered 2016-11-14: 360 mg via INTRAVENOUS
  Filled 2016-11-14: qty 9

## 2016-11-14 MED ORDER — SUGAMMADEX SODIUM 200 MG/2ML IV SOLN
INTRAVENOUS | Status: DC | PRN
  Administered 2016-11-14: 300 mg via INTRAVENOUS

## 2016-11-14 MED ORDER — CEFAZOLIN SODIUM-DEXTROSE 2-4 GM/100ML-% IV SOLN
2.0000 g | INTRAVENOUS | Status: AC
  Administered 2016-11-14: 2 g via INTRAVENOUS
  Filled 2016-11-14: qty 100

## 2016-11-14 MED ORDER — PROPOFOL 10 MG/ML IV BOLUS
INTRAVENOUS | Status: AC
  Filled 2016-11-14: qty 20

## 2016-11-14 MED ORDER — FLUTICASONE PROPIONATE 50 MCG/ACT NA SUSP
1.0000 | NASAL | Status: DC | PRN
  Filled 2016-11-14: qty 16

## 2016-11-14 SURGICAL SUPPLY — 27 items
BAG DRAIN URO-CYSTO SKYTR STRL (DRAIN) ×2 IMPLANT
BAG URINE DRAINAGE (UROLOGICAL SUPPLIES) ×2 IMPLANT
BAG URINE LEG 19OZ MD ST LTX (BAG) ×2 IMPLANT
CATH FOLEY 2WAY SLVR  5CC 20FR (CATHETERS)
CATH FOLEY 2WAY SLVR  5CC 22FR (CATHETERS)
CATH FOLEY 2WAY SLVR 30CC 20FR (CATHETERS) IMPLANT
CATH FOLEY 2WAY SLVR 5CC 20FR (CATHETERS) IMPLANT
CATH FOLEY 2WAY SLVR 5CC 22FR (CATHETERS) IMPLANT
CATH FOLEY 3WAY 30CC 22FR (CATHETERS) ×2 IMPLANT
CATH HEMA 3WAY 30CC 24FR COUDE (CATHETERS) IMPLANT
CATH HEMA 3WAY 30CC 24FR RND (CATHETERS) IMPLANT
CLOTH BEACON ORANGE TIMEOUT ST (SAFETY) ×2 IMPLANT
ELECT LOOP MED HF 24F 12D (CUTTING LOOP) ×2 IMPLANT
ELECT REM PT RETURN 9FT ADLT (ELECTROSURGICAL)
ELECTRODE REM PT RTRN 9FT ADLT (ELECTROSURGICAL) IMPLANT
GLOVE SURG SS PI 8.0 STRL IVOR (GLOVE) ×2 IMPLANT
GOWN STRL REUS W/ TWL XL LVL3 (GOWN DISPOSABLE) IMPLANT
GOWN STRL REUS W/TWL LRG LVL3 (GOWN DISPOSABLE) ×2 IMPLANT
GOWN STRL REUS W/TWL XL LVL3 (GOWN DISPOSABLE) ×2 IMPLANT
HOLDER FOLEY CATH W/STRAP (MISCELLANEOUS) ×2 IMPLANT
KIT RM TURNOVER CYSTO AR (KITS) ×2 IMPLANT
MANIFOLD NEPTUNE II (INSTRUMENTS) ×2 IMPLANT
PACK CYSTO (CUSTOM PROCEDURE TRAY) ×2 IMPLANT
PLUG CATH AND CAP STER (CATHETERS) IMPLANT
SET ASPIRATION TUBING (TUBING) IMPLANT
SYR 30ML LL (SYRINGE) ×2 IMPLANT
TUBE CONNECTING 12X1/4 (SUCTIONS) IMPLANT

## 2016-11-14 NOTE — Transfer of Care (Signed)
Immediate Anesthesia Transfer of Care Note  Patient: Terry Winters  Procedure(s) Performed: TRANSURETHRAL RESECTION OF THE PROSTATE (TURP) (N/A Prostate)  Patient Location: PACU  Anesthesia Type:General  Level of Consciousness: awake, alert , oriented and patient cooperative  Airway & Oxygen Therapy: Patient Spontanous Breathing and Patient connected to nasal cannula oxygen  Post-op Assessment: Report given to RN and Post -op Vital signs reviewed and stable  Post vital signs: Reviewed and stable  Last Vitals:  Vitals:   11/14/16 1043  BP: (!) 152/81  Pulse: 75  Resp: 16  Temp: 36.6 C  SpO2: 98%    Last Pain:  Vitals:   11/14/16 1043  TempSrc: Oral      Patients Stated Pain Goal: 5 (11/14/16 1120)  Complications: No apparent anesthesia complications

## 2016-11-14 NOTE — Discharge Instructions (Signed)
Transurethral Resection of the Prostate, Care After °Refer to this sheet in the next few weeks. These instructions provide you with information about caring for yourself after your procedure. Your health care provider may also give you more specific instructions. Your treatment has been planned according to current medical practices, but problems sometimes occur. Call your health care provider if you have any problems or questions after your procedure. °What can I expect after the procedure? °After the procedure, it is common to have: °· Mild pain in your lower abdomen. °· Soreness or mild discomfort in your penis from having the catheter inserted during the procedure. °· A feeling of urgency when you need to urinate. °· A small amount of blood in your urine. You may notice some small blood clots in your urine. These are normal. ° °Follow these instructions at home: °Medicines ° °· Take over-the-counter and prescription medicines only as told by your health care provider. °· Do not drive or operate heavy machinery while taking prescription pain medicine. °· Do not drive for 24 hours if you received a sedative. °· If you were prescribed antibiotic medicine, take it as told by your health care provider. Do not stop taking the antibiotic even if you start to feel better. °Activity °· Return to your normal activities as told by your health care provider. Ask your health care provider what activities are safe for you. °· Do not lift anything that is heavier than 10 lb (4.5 kg) for 3 weeks after your procedure, or as long as told by your health care provider. °· Avoid intense physical activity for as long as told by your health care provider. °· Walk at least one time every day. This helps to prevent blood clots. You may increase your physical activity gradually as you start to feel better. °Lifestyle °· Do not drink alcohol for as long as told by your health care provider. This is especially important if you are taking  prescription pain medicines. °· Do not engage in sexual activity until your health care provider says that you can do this. °General instructions °· Do not take baths, swim, or use a hot tub until your health care provider approves. °· Drink enough fluid to keep your urine clear or pale yellow. °· Urinate as soon as you feel the need to. Do not try to hold your urine for long periods of time. °· If your health care provider approves, you may take a stool softener for 2-3 weeks to prevent you from straining to have a bowel movement. °· Wear compression stockings as told by your health care provider. These stockings help to prevent blood clots and reduce swelling in your legs. °· Keep all follow-up visits as told by your health care provider. This is important. °Contact a health care provider if: °· You have difficulty urinating. °· You have a fever. °· You have pain that gets worse or does not improve with medicine. °· You have blood in your urine that does not go away after 1 week of resting and drinking more fluids. °· You have swelling in your penis or testicles. °Get help right away if: °· You are unable to urinate. °· You are having more blood clots in your urine instead of fewer. °· You have: °? Large blood clots. °? A lot of blood in your urine. °? Pain in your back or lower abdomen. °? Pain or swelling in your legs. °? Chills and you are shaking. °This information is not intended to   replace advice given to you by your health care provider. Make sure you discuss any questions you have with your health care provider. °Document Released: 01/28/2005 Document Revised: 10/01/2015 Document Reviewed: 10/20/2014 °Elsevier Interactive Patient Education © 2017 [employer] ° °

## 2016-11-14 NOTE — Brief Op Note (Signed)
11/14/2016  12:55 PM  PATIENT:  Terry Winters  82 y.o. male  PRE-OPERATIVE DIAGNOSIS:  BENIGN PROSTATE HYPERPLASIA WITH RETENTION   POST-OPERATIVE DIAGNOSIS:  BENIGN PROSTATE HYPERPLASIA WITH  PROCEDURE:  Procedure(s): TRANSURETHRAL RESECTION OF THE PROSTATE (TURP) (N/A)  SURGEON:  Surgeon(s) and Role:    * Bjorn Pippin, MD - Primary  PHYSICIAN ASSISTANT:   ASSISTANTS: none   ANESTHESIA:   general  EBL:  Total I/O In: -  Out: 25 [Blood:25]  BLOOD ADMINISTERED:none  DRAINS: Urinary Catheter (Foley)   LOCAL MEDICATIONS USED:  NONE  SPECIMEN:  Source of Specimen:  prostate chips  DISPOSITION OF SPECIMEN:  PATHOLOGY  COUNTS:  YES  TOURNIQUET:  * No tourniquets in log *  DICTATION: .Other Dictation: Dictation Number [id number]  PLAN OF CARE: Admit for overnight observation  PATIENT DISPOSITION:  PACU - hemodynamically stable.   Delay start of Pharmacological VTE agent (>24hrs) due to surgical blood loss or risk of bleeding: yes

## 2016-11-14 NOTE — Anesthesia Postprocedure Evaluation (Signed)
Anesthesia Post Note  Patient: Terry Winters  Procedure(s) Performed: TRANSURETHRAL RESECTION OF THE PROSTATE (TURP) (N/A Prostate)     Patient location during evaluation: PACU Anesthesia Type: General Level of consciousness: awake and alert Pain management: pain level controlled Vital Signs Assessment: post-procedure vital signs reviewed and stable Respiratory status: spontaneous breathing, nonlabored ventilation, respiratory function stable and patient connected to nasal cannula oxygen Cardiovascular status: blood pressure returned to baseline and stable Postop Assessment: no apparent nausea or vomiting Anesthetic complications: no    Last Vitals:  Vitals:   11/14/16 1334 11/14/16 1345  BP:  (!) 165/89  Pulse: 60 65  Resp: 16 20  Temp:    SpO2: 100% 100%    Last Pain:  Vitals:   11/14/16 1330  TempSrc:   PainSc: 7                  Aariya Ferrick

## 2016-11-14 NOTE — Anesthesia Procedure Notes (Addendum)
Procedure Name: Intubation Date/Time: 11/14/2016 12:12 PM Performed by: Wanita Chamberlain Pre-anesthesia Checklist: Patient identified, Timeout performed, Emergency Drugs available, Suction available and Patient being monitored Patient Re-evaluated:Patient Re-evaluated prior to induction Oxygen Delivery Method: Circle system utilized Preoxygenation: Pre-oxygenation with 100% oxygen Induction Type: IV induction and Cricoid Pressure applied Ventilation: Mask ventilation without difficulty Laryngoscope Size: Glidescope and 3 Grade View: Grade IV Tube type: Oral Tube size: 7.5 mm Number of attempts: 4 Airway Equipment and Method: Video-laryngoscopy Placement Confirmation: breath sounds checked- equal and bilateral,  CO2 detector and ETT inserted through vocal cords under direct vision Secured at: 22 cm Tube secured with: Tape Dental Injury: Teeth and Oropharynx as per pre-operative assessment  Comments: 3  attempts at placement of #4 LMA Kaytlan Behrman/Foster.  Mac #3 X 1 Foster to intubate pt. Grade IV view. Aborted then proceeded w/ Glidscope intubation- successful first attempt. Limited neck extension, ? Soft tissue abnormality.  VSS throughout. Easy mask airway.

## 2016-11-14 NOTE — Anesthesia Preprocedure Evaluation (Addendum)
Anesthesia Evaluation  Patient identified by MRN, date of birth, ID band Patient awake    Reviewed: Allergy & Precautions, NPO status , Patient's Chart, lab work & pertinent test results  Airway Mallampati: II  TM Distance: >3 FB Neck ROM: Full    Dental no notable dental hx. (+) Poor Dentition, Partial Upper, Missing, Caps   Pulmonary former smoker,    Pulmonary exam normal breath sounds clear to auscultation       Cardiovascular hypertension, Pt. on medications Normal cardiovascular exam Rhythm:Regular Rate:Normal  EKG- NSR with PVC   Neuro/Psych Post herpetic neuralgia  Neuromuscular disease negative psych ROS   GI/Hepatic Neg liver ROS, GERD  Controlled and Medicated,  Endo/Other  diabetes, Well Controlled, Type 2Hyperlipidemia  Renal/GU Renal InsufficiencyRenal disease   BPH Urinary retention-has indwelling Foley catheter ED    Musculoskeletal negative musculoskeletal ROS (+)   Abdominal   Peds  Hematology negative hematology ROS (+)   Anesthesia Other Findings   Reproductive/Obstetrics negative OB ROS                            Anesthesia Physical Anesthesia Plan  ASA: II  Anesthesia Plan: General   Post-op Pain Management:    Induction: Intravenous  PONV Risk Score and Plan: 4 or greater and Ondansetron, Dexamethasone, Midazolam, Propofol infusion, Promethazine and Treatment may vary due to age or medical condition  Airway Management Planned: LMA  Additional Equipment:   Intra-op Plan:   Post-operative Plan: Extubation in OR  Informed Consent: I have reviewed the patients History and Physical, chart, labs and discussed the procedure including the risks, benefits and alternatives for the proposed anesthesia with the patient or authorized representative who has indicated his/her understanding and acceptance.   Dental advisory given  Plan Discussed with: CRNA,  Anesthesiologist and Surgeon  Anesthesia Plan Comments:         Anesthesia Quick Evaluation

## 2016-11-14 NOTE — H&P (Signed)
CC: I have symptoms of an enlarged prostate.  HPI: Terry Winters is a 82 year-old male established patient who is here for symptoms of enlarged prostate.  His symptoms have gotten worse over the last year. He has been treated with Uroxatral. The patient has never had a surgical procedure for bladder outlet obstruction to his prostate.   Terry Winters returns today for BPH with BOO. He was having increased voiding symptoms and was given cipro on 8/1 which didn't help and then he was given doxycycline which he finished yesterday. He was having lower abdominal pain and pain at the tip of the penis. He had a CT on 8/10 that showed a markedly dilated bladder with bilateral hydro and his Cr was up to 1.6. He had some myalgias with the cipro. He has nocturia x 3 with urgency and UUI. He is wearing a pad. He has some hesitancy. He thinks he empties most of the time. He had some constipation about 2 weeks ago. I switched him to alfuzosin 3 months ago and he thinks that does help. He has no dysuria or hematuria. His culture on 8/1 was negative. His UA today has 2+ blood and 1+ LE. He has had some low back pain. He has had no flank pain or nausea. He has had no fevers.      ALLERGIES: PredniSONE TABS Sulfa Drugs    MEDICATIONS: Uroxatral 10 mg tablet, extended release 24 hr 1 tablet PO Daily take daily with meals for bladder emptying.  Aspirin 81 MG TABS Oral  Flonase Allergy Relief 1 PO Daily  Lipitor 20 MG Oral Tablet Oral  Losartan Potassium     GU PSH: None     PSH Notes: Total Disc Arthroplasty Cervical, Tonsillectomy   NON-GU PSH: Remove Tonsils - 2014    GU PMH: Microscopic hematuria (Worsening), He has an increase to 1+ from trace blood on the UA today. - 03/08/2016 Weak Urinary Stream - 03/08/2016 BPH w/LUTS, Benign prostatic hyperplasia with urinary obstruction - 2014 Urge incontinence, Urge incontinence of urine - 2014    NON-GU PMH: Glycosuria, Glycosuria - 2014 Gout, Gout -  2014 Personal history of other diseases of the circulatory system, History of hypertension - 2014 Personal history of other endocrine, nutritional and metabolic disease, History of hyperlipidemia - 2014 Encounter for general adult medical examination without abnormal findings, Encounter for preventive health examination Hypercholesterolemia Hypertension    FAMILY HISTORY: ALS-parkinsonism/Dementia Complex 1 - Father Death In The Family Father - Runs In Family Family Health Status Number - Runs In Family nephrolithiasis - Mother Tuberculosis - Grandfather   SOCIAL HISTORY: Marital Status: Married Preferred Language: English Current Smoking Status: Patient does not smoke anymore. Has not smoked since 02/11/1966. Smoked for 10 years.   Tobacco Use Assessment Completed: Used Tobacco in last 30 days? Drinks 1 drink per day.  Drinks 2 caffeinated drinks per day. Patient's occupation is/was retired.     Notes: Tobacco use, Retired From Work, Marital History - Currently Married, Alcohol Use, Caffeine Use   REVIEW OF SYSTEMS:    GU Review Male:   Patient reports frequent urination, leakage of urine, and stream starts and stops. Patient denies hard to postpone urination, burning/ pain with urination, get up at night to urinate, trouble starting your stream, have to strain to urinate , erection problems, and penile pain.  Gastrointestinal (Upper):   Patient reports indigestion/ heartburn. Patient denies nausea and vomiting.  Gastrointestinal (Lower):   Patient denies diarrhea and constipation.  Constitutional:  Patient denies fever, night sweats, weight loss, and fatigue.  Skin:   Patient denies skin rash/ lesion and itching.  Eyes:   Patient denies blurred vision and double vision.  Ears/ Nose/ Throat:   Patient denies sore throat and sinus problems.  Hematologic/Lymphatic:   Patient denies easy bruising and swollen glands.  Cardiovascular:   Patient reports leg swelling. Patient denies chest  pains.  Respiratory:   Patient denies cough and shortness of breath.  Endocrine:   Patient denies excessive thirst.  Musculoskeletal:   Patient reports joint pain. Patient denies back pain.  Neurological:   Patient denies headaches and dizziness.  Psychologic:   Patient denies depression and anxiety.   VITAL SIGNS:      09/27/2016 01:46 PM  BP 176/77 mmHg  Heart Rate 79 /min  Temperature 97.7 F / 36.5 C   MULTI-SYSTEM PHYSICAL EXAMINATION:    Constitutional: Well-nourished. No physical deformities. Normally developed. Good grooming.  Respiratory: No labored breathing, no use of accessory muscles. CTA  Cardiovascular: Normal temperature, RRR without murmur  Gastrointestinal: Abdominal mass present up to the umbilicus consistent with a distended bladder, obese. No tenderness, no rigidity.      PAST DATA REVIEWED:  Source Of History:  Patient  Lab Test Review:   PSA, BMP  Urine Test Review:   Urinalysis, Urine Culture  X-Ray Review: C.T. Abdomen/Pelvis: Reviewed Films. Reviewed Report. Discussed With Patient.     03/20/12  PSA  Total PSA 0.83    Notes:                     His PSA was 3.1 recently and His Cr was 1.6.    PROCEDURES:          Catheter / SP Tube - [id number] Simple Foley Catheterization  A 18 French Coude Foley catheter was inserted into the bladder using sterile technique. The patient was taught routine catheter care. A leg bag was connected. 1200 cc of urine was obtained.         Urinalysis - 81003 Dipstick Dipstick Cont'd  Specimen: Voided Bilirubin: Neg  Color: Yellow Ketones: Neg  Appearance: Clear Blood: 2+  Specific Gravity: <= 1.005 Protein: Neg  pH: 5.0 Urobilinogen: 0.2  Glucose: Neg Nitrites: Neg    Leukocyte Esterase: 1+    ASSESSMENT:      ICD-10 Details  1 GU:   BPH w/LUTS - N40.1 He has retention with obstructive uropathy. He had a middle lobe on CT so I think his best option is TURP in 2-3 weeks. I have reviewed the risks of bleeding,  infection, incontinence, stricturing, persistent retention, sexual dysfunction, fluid overload, thrombotic events and anesthetic complications. UA micro and culture today. I am going to begin finasteride and reviewed the side effects.   2   Urinary Retention - R33.8   3   Chronic kidney disease stage 3 (GFR 30-60) - N18.3 I will check a BMP and warned him about post obstructive diuresis.    PLAN:            Medications New Meds: Finasteride 5 mg tablet 1 tablet PO Daily   #30  11 Refill(s)            Orders Labs BMP, Urinalysis w/Scope, Urine Culture          Schedule Return Visit/Planned Activity: Next Available Appointment - Schedule Surgery

## 2016-11-15 DIAGNOSIS — N401 Enlarged prostate with lower urinary tract symptoms: Secondary | ICD-10-CM | POA: Diagnosis not present

## 2016-11-15 MED ORDER — HYDROCODONE-ACETAMINOPHEN 5-325 MG PO TABS
ORAL_TABLET | ORAL | Status: AC
  Filled 2016-11-15: qty 1

## 2016-11-15 NOTE — Progress Notes (Signed)
Few blood clots noted in catheter tubing at 0500.  Urine dark pink.  Continued CBI until 0600.  Foley and CBI discontinued at 0600- urine light pink and no blood clots seen.  Pt tolerated procedure well.  Instructed pt on string of bottles and to call for assistance if needed.  Pt voiced understanding.

## 2016-11-15 NOTE — Discharge Summary (Signed)
Patient ID: Terry Winters MRN: [id number] DOB/AGE: 1934/12/12 82 y.o.  Admit date: 11/14/2016 Discharge date: 11/15/2016  Primary Care Physician:  Junie Spencer, FNP  Discharge Diagnoses:  BPH with obstruction Present on Admission: . BPH with obstruction/lower urinary tract symptoms   Consults:  None   Discharge Medications: Allergies as of 11/15/2016      Reactions   Amoxicillin Rash   Prednisone Rash   Sulfa Antibiotics Other (See Comments)   "upset stomach"   Triamcinolone Rash      Medication List    TAKE these medications   aspirin 81 MG tablet Take 81 mg by mouth every morning.   atorvastatin 20 MG tablet Commonly known as:  LIPITOR TAKE 1 TABLET EVERY DAY  AT  6PM   cephALEXin 250 MG capsule Commonly known as:  KEFLEX Take 1 capsule (250 mg total) by mouth 3 (three) times daily.   cetirizine 10 MG tablet Commonly known as:  ZYRTEC Take 10 mg by mouth as needed for allergies.   docusate sodium 100 MG capsule Commonly known as:  COLACE Take 1 capsule (100 mg total) by mouth 2 (two) times daily.   finasteride 5 MG tablet Commonly known as:  PROSCAR Take 5 mg by mouth every morning.   fish oil-omega-3 fatty acids 1000 MG capsule Take 1 g by mouth daily. 5 days per week   fluticasone 50 MCG/ACT nasal spray Commonly known as:  FLONASE Place into both nostrils as needed for allergies or rhinitis.   losartan 100 MG tablet Commonly known as:  COZAAR TAKE 1 TABLET (100 MG TOTAL) BY MOUTH DAILY.   LUTEIN PO Take 1 tablet by mouth 3 (three) times a week.   VITAMIN D3 GUMMIES ADULT 1000 units Chew Generic drug:  Cholecalciferol Chew by mouth daily. One to two chews daily   ZINC PO Take 1 tablet by mouth once a week.        Significant Diagnostic Studies:  No results found.  Brief H and P: For complete details please refer to admission H and P, but in brief patient is admitted for TURP for mgmt of obbstructib=ve symptoms  Hospital Course:   Active Problems:   BPH with obstruction/lower urinary tract symptoms   Day of Discharge BP 133/66 (BP Location: Right Arm)   Pulse 65   Temp 98 F (36.7 C) (Oral)   Resp 18   Ht  (1.702 m)   Wt 80.3 kg (177 lb)   SpO2 98%   BMI 27.72 kg/m   Results for orders placed or performed during the hospital encounter of 11/14/16 (from the past 24 hour(s))  I-STAT, chem 8     Status: Abnormal   Collection Time: 11/14/16 11:37 AM  Result Value Ref Range   Sodium 140 135 - 145 mmol/L   Potassium 3.7 3.5 - 5.1 mmol/L   Chloride 101 101 - 111 mmol/L   BUN 9 6 - 20 mg/dL   Creatinine, Ser 4.09 0.61 - 1.24 mg/dL   Glucose, Bld 811 (H) 65 - 99 mg/dL   Calcium, Ion 9.14 7.82 - 1.40 mmol/L   TCO2 25 22 - 32 mmol/L   Hemoglobin 14.6 13.0 - 17.0 g/dL   HCT 95.6 21.3 - 08.6 %    Physical Exam: General: Alert and awake oriented x3 not in any acute distress. HEENT: anicteric sclera, pupils reactive to light and accommodation CVS: S1-S2 clear no murmur rubs or gallops Chest: clear to auscultation bilaterally, no wheezing rales or rhonchi  Abdomen: soft nontender, nondistended, normal bowel sounds, no organomegaly Extremities: no cyanosis, clubbing or edema noted bilaterally Neuro: Cranial nerves II-XII intact, no focal neurological deficits  Disposition:  Home  Diet:  Regular  Activity:  Instructions given   Disposition and Follow-up:    Fu arranged  TESTS THAT NEED FOLLOW-UP  Pathology  DISCHARGE FOLLOW-UP Follow-up Information    Bjorn Pippin, MD On 11/29/2016.   Specialty:  Urology Why:  [id number] Contact information: [address] [place] [address] [phone]           Time spent on Discharge:  10 mins  Signed: Chelsea Aus 11/15/2016, 7:49 AM

## 2016-11-15 NOTE — Op Note (Signed)
NAME:  ZYLAN, ALMQUIST                     ACCOUNT NO.:  MEDICAL RECORD NO.:  [id number]  LOCATION:                                 FACILITY:  PHYSICIAN:  Excell Seltzer. Annabell Howells, M.D.         DATE OF BIRTH:  DATE OF PROCEDURE:  11/14/2016 DATE OF DISCHARGE:                              OPERATIVE REPORT   PROCEDURE:  Transurethral resection of prostate.  PREOPERATIVE DIAGNOSIS:  Benign prostatic hypertrophy with retention.  POSTOPERATIVE DIAGNOSIS:  Benign prostatic hypertrophy with retention.  SURGEON:  Excell Seltzer. Annabell Howells, M.D.  ANESTHESIA:  General.  SPECIMEN:  Prostate chips.  DRAINS:  A 22-French 3-way Foley catheter.  BLOOD LOSS:  Approximately 50 mL.  COMPLICATIONS:  None.  INDICATIONS:  Mr. Tumolo is an 82 year old white male who has BPH and bladder outlet obstruction and retention for several months, that has been managed with Foley catheter.  He has elected to undergo TURP.  He had been placed on finasteride prior to the procedure findings.  FINDINGS AND PROCEDURE:  He was taken to the operating room where he was given Ancef and gentamicin.  A general anesthetic was induced.  He was placed in lithotomy position.  His perineum and genitalia were prepped with Betadine solution and he was draped in usual sterile fashion.  The 28-French continuous flow resectoscope sheath was placed with the aid of visual obturator using a 30-degree lens.  Examination revealed a normal urethra.  The external sphincter was intact.  The prostatic urethra was approximately 3-4 cm in length with trilobar hyperplasia with a ball valving middle lobe.  Examination of bladder revealed moderate-to-severe trabeculation with cellules.  The mucosa was erythematous from catheter irritation.  No tumors or stones were noted.  Ureteral orifices were unremarkable and well away from the bladder neck.  The visual obturator was exchanged for an [place] handle with a bipolar loop and 30 degree lens.  Saline was used  as the irrigant.  Resection was initiated at the middle lobe, which was resected down to the bladder neck fibers.  The floor of the prostate was resected out to and alongside the verumontanum.  The left lobe of the prostate was resected from bladder neck to apex out to the capsular fibers.  This was followed by the right lobe.  Chips were evacuated as needed.  Once the bulk of the resection was performed, additional resection from the apical area was performed to complete the procedure.  The final chips were evacuated.  Final hemostasis was achieved. Inspection revealed no active bleeding.  No retained chips.  Ureteral orifices were intact. The external sphincter was intact, and the patient had a widely patent prostatic urethra.  The scope was removed.  Pressure on the bladder produced an excellent stream.  A 22-French 3-way Foley catheter was placed.  With the aid of a catheter guide, the balloon was filled with 30 mL sterile fluid.  The catheter was irrigated with clear return and placed on continuous irrigation and straight drainage.  The patient was taken down from lithotomy position.  His anesthetic was reversed.  He was moved to the recovery room in stable condition.  There were no complications.     Excell Seltzer. Annabell Howells, M.D.     JJW/MEDQ  D:  11/14/2016  T:  11/14/2016  Job:  [id number]

## 2016-11-18 ENCOUNTER — Encounter (HOSPITAL_BASED_OUTPATIENT_CLINIC_OR_DEPARTMENT_OTHER): Payer: Self-pay | Admitting: Urology

## 2016-11-25 ENCOUNTER — Other Ambulatory Visit: Payer: Self-pay | Admitting: Family Medicine

## 2016-11-29 ENCOUNTER — Ambulatory Visit (INDEPENDENT_AMBULATORY_CARE_PROVIDER_SITE_OTHER): Payer: Self-pay | Admitting: Urology

## 2016-11-29 DIAGNOSIS — R338 Other retention of urine: Secondary | ICD-10-CM

## 2016-11-29 DIAGNOSIS — N401 Enlarged prostate with lower urinary tract symptoms: Secondary | ICD-10-CM

## 2016-12-27 ENCOUNTER — Other Ambulatory Visit (HOSPITAL_COMMUNITY)
Admission: AD | Admit: 2016-12-27 | Discharge: 2016-12-27 | Disposition: A | Discharge status: Home / Self Care | Payer: Medicare PPO | Source: Other Acute Inpatient Hospital | Attending: Urology | Admitting: Urology

## 2016-12-27 ENCOUNTER — Ambulatory Visit: Payer: Self-pay | Admitting: Urology

## 2016-12-27 DIAGNOSIS — N401 Enlarged prostate with lower urinary tract symptoms: Secondary | ICD-10-CM

## 2016-12-27 DIAGNOSIS — N3941 Urge incontinence: Secondary | ICD-10-CM | POA: Diagnosis not present

## 2016-12-29 LAB — URINE CULTURE

## 2017-03-12 ENCOUNTER — Other Ambulatory Visit: Payer: Self-pay | Admitting: Family

## 2017-06-13 ENCOUNTER — Ambulatory Visit: Payer: Self-pay | Admitting: Urology

## 2017-07-31 ENCOUNTER — Other Ambulatory Visit: Payer: Self-pay | Admitting: Family

## 2017-08-01 NOTE — Telephone Encounter (Signed)
Patient NTBS for follow up and lab work  

## 2017-08-01 NOTE — Telephone Encounter (Signed)
Last lipid 07/09/16

## 2017-08-08 ENCOUNTER — Ambulatory Visit: Payer: 59 | Admitting: Urology

## 2017-08-08 DIAGNOSIS — N401 Enlarged prostate with lower urinary tract symptoms: Secondary | ICD-10-CM | POA: Diagnosis not present

## 2017-08-08 DIAGNOSIS — R3121 Asymptomatic microscopic hematuria: Secondary | ICD-10-CM

## 2017-08-08 DIAGNOSIS — R350 Frequency of micturition: Secondary | ICD-10-CM

## 2017-09-26 ENCOUNTER — Telehealth: Payer: Self-pay | Admitting: Family Medicine

## 2017-09-26 ENCOUNTER — Other Ambulatory Visit: Payer: Self-pay

## 2017-09-26 MED ORDER — ATORVASTATIN CALCIUM 20 MG PO TABS: ORAL_TABLET | ORAL | 0 refills | Status: DC

## 2017-10-01 ENCOUNTER — Encounter: Payer: Self-pay | Admitting: Family Medicine

## 2017-10-01 ENCOUNTER — Ambulatory Visit: Payer: Medicare PPO | Admitting: Family Medicine

## 2017-10-01 VITALS — BP 137/75 | HR 73 | Temp 97.0°F | Ht 67.0 in | Wt 190.4 lb

## 2017-10-01 DIAGNOSIS — N401 Enlarged prostate with lower urinary tract symptoms: Secondary | ICD-10-CM

## 2017-10-01 DIAGNOSIS — I10 Essential (primary) hypertension: Secondary | ICD-10-CM | POA: Diagnosis not present

## 2017-10-01 DIAGNOSIS — E782 Mixed hyperlipidemia: Secondary | ICD-10-CM

## 2017-10-01 DIAGNOSIS — K2100 Gastro-esophageal reflux disease with esophagitis, without bleeding: Secondary | ICD-10-CM

## 2017-10-01 DIAGNOSIS — E559 Vitamin D deficiency, unspecified: Secondary | ICD-10-CM

## 2017-10-01 DIAGNOSIS — E663 Overweight: Secondary | ICD-10-CM | POA: Diagnosis not present

## 2017-10-01 DIAGNOSIS — R3912 Poor urinary stream: Secondary | ICD-10-CM

## 2017-10-01 MED ORDER — LOSARTAN POTASSIUM 100 MG PO TABS: 100.0000 mg | ORAL_TABLET | Freq: Every day | ORAL | 3 refills | Status: DC

## 2017-10-01 MED ORDER — ATORVASTATIN CALCIUM 20 MG PO TABS: ORAL_TABLET | ORAL | 3 refills | Status: DC

## 2017-10-01 NOTE — Progress Notes (Signed)
BP 137/75   Pulse 73   Temp (!) 97 F (36.1 C) (Oral)   Ht '5\' 7"'  (1.702 m)   Wt 190 lb 6.4 oz (86.4 kg)   BMI 29.82 kg/m    Subjective:    Patient ID: Terry Winters, male    DOB: November 15, 1934, 83 y.o.   MRN: [id number]  HPI: Terry Winters is a 83 y.o. male presenting on 10/01/2017 for Medical Management of Chronic Issues (six month recheck)   HPI Hypertension Patient is currently on losartan, and their blood pressure today is 137/75. Patient denies any lightheadedness or dizziness. Patient denies headaches, blurred vision, chest pains, shortness of breath, or weakness. Denies any side effects from medication and is content with current medication.   Hyperlipidemia Patient is coming in for recheck of his hyperlipidemia. The patient is currently taking Lipitor and fish oil. They deny any issues with myalgias or history of liver damage from it. They deny any focal numbness or weakness or chest pain.   GERD Patient is currently on no medication currently and has been doing well.  She denies any major symptoms or abdominal pain or belching or burping. She denies any blood in her stool or lightheadedness or dizziness.   Patient has a history of vitamin D deficiency and we will recheck that today, he also has a history of BPH with obstruction in the past and he saw urology and had a procedure that that is no fixed and we will recheck his PSA today.  He says is been urinating well and not having any major issues.  Relevant past medical, surgical, family and social history reviewed and updated as indicated. Interim medical history since our last visit reviewed. Allergies and medications reviewed and updated.  Review of Systems  Constitutional: Negative for chills and fever.  Eyes: Negative for discharge and visual disturbance.  Respiratory: Negative for shortness of breath and wheezing.   Cardiovascular: Negative for chest pain and leg swelling.  Gastrointestinal: Negative for abdominal  pain.  Genitourinary: Negative for decreased urine volume, difficulty urinating, dysuria and frequency.  Musculoskeletal: Negative for back pain and gait problem.  Skin: Negative for rash.  Neurological: Negative for dizziness, weakness, light-headedness and headaches.  All other systems reviewed and are negative.   Per HPI unless specifically indicated above   Allergies as of 10/01/2017      Reactions   Amoxicillin Rash   Prednisone Rash   Sulfa Antibiotics Other (See Comments)   "upset stomach"   Triamcinolone Rash      Medication List        Accurate as of 10/01/17 10:13 AM. Always use your most recent med list.          aspirin 81 MG tablet Take 81 mg by mouth every morning.   atorvastatin 20 MG tablet Commonly known as:  LIPITOR TAKE 1 TABLET EVERY DAY AT 6 PM   cetirizine 10 MG tablet Commonly known as:  ZYRTEC Take 10 mg by mouth as needed for allergies.   docusate sodium 100 MG capsule Commonly known as:  COLACE Take 1 capsule (100 mg total) by mouth 2 (two) times daily.   finasteride 5 MG tablet Commonly known as:  PROSCAR Take 5 mg by mouth every morning.   fish oil-omega-3 fatty acids 1000 MG capsule Take 1 g by mouth daily. 5 days per week   fluticasone 50 MCG/ACT nasal spray Commonly known as:  FLONASE Place into both nostrils as needed for allergies or rhinitis.  losartan 100 MG tablet Commonly known as:  COZAAR TAKE 1 TABLET (100 MG TOTAL) BY MOUTH DAILY.   LUTEIN PO Take 1 tablet by mouth 3 (three) times a week.   VITAMIN D3 GUMMIES ADULT 1000 units Chew Generic drug:  Cholecalciferol Chew by mouth daily. One to two chews daily   ZINC PO Take 1 tablet by mouth once a week.          Objective:    BP 137/75   Pulse 73   Temp (!) 97 F (36.1 C) (Oral)   Ht '5\' 7"'  (1.702 m)   Wt 190 lb 6.4 oz (86.4 kg)   BMI 29.82 kg/m   Wt Readings from Last 3 Encounters:  10/01/17 190 lb 6.4 oz (86.4 kg)  11/14/16 177 lb (80.3 kg)    09/11/16 193 lb (87.5 kg)    Physical Exam  Constitutional: He is oriented to person, place, and time. He appears well-developed and well-nourished. No distress.  Eyes: Pupils are equal, round, and reactive to light. Conjunctivae and EOM are normal. No scleral icterus.  Neck: Neck supple. No thyromegaly present.  Cardiovascular: Normal rate, regular rhythm, normal heart sounds and intact distal pulses.  No murmur heard. Pulmonary/Chest: Effort normal and breath sounds normal. No respiratory distress. He has no wheezes.  Musculoskeletal: Normal range of motion. He exhibits no edema.  Lymphadenopathy:    He has no cervical adenopathy.  Neurological: He is alert and oriented to person, place, and time. Coordination normal.  Skin: Skin is warm and dry. No rash noted. He is not diaphoretic.  Psychiatric: He has a normal mood and affect. His behavior is normal.  Nursing note and vitals reviewed.       Assessment & Plan:   Problem List Items Addressed This Visit      Cardiovascular and Mediastinum   HTN (hypertension) - Primary   Relevant Medications   atorvastatin (LIPITOR) 20 MG tablet   losartan (COZAAR) 100 MG tablet   Other Relevant Orders   CMP14+EGFR     Digestive   GERD (gastroesophageal reflux disease)   Relevant Orders   CBC with Differential/Platelet     Genitourinary   BPH (benign prostatic hyperplasia)   Relevant Orders   PSA, total and free     Other   HLD (hyperlipidemia)   Relevant Medications   atorvastatin (LIPITOR) 20 MG tablet   losartan (COZAAR) 100 MG tablet   Other Relevant Orders   Lipid panel   Overweight   Vitamin D deficiency   Relevant Orders   VITAMIN D 25 Hydroxy (Vit-D Deficiency, Fractures)       Follow up plan: Return in about 6 months (around 04/03/2018), or if symptoms worsen or fail to improve, for Hypertension and cholesterol and GERD recheck.  Counseling provided for all of the vaccine components Orders Placed This Encounter   Procedures  . VITAMIN D 25 Hydroxy (Vit-D Deficiency, Fractures)  . PSA, total and free  . CBC with Differential/Platelet  . CMP14+EGFR  . Lipid panel    Caryl Pina, MD [facility] 10/01/2017, 10:13 AM

## 2017-10-02 LAB — LIPID PANEL
CHOLESTEROL TOTAL: 175 mg/dL (ref 100–199)
Chol/HDL Ratio: 3.7 ratio (ref 0.0–5.0)
HDL: 47 mg/dL (ref >39)
LDL CALC: 101 mg/dL — AB (ref 0–99)
TRIGLYCERIDES: 137 mg/dL (ref 0–149)
VLDL Cholesterol Cal: 27 mg/dL (ref 5–40)

## 2017-10-02 LAB — CBC WITH DIFFERENTIAL/PLATELET
BASOS: 0 %
Basophils Absolute: 0 10*3/uL (ref 0.0–0.2)
EOS (ABSOLUTE): 0.1 10*3/uL (ref 0.0–0.4)
Eos: 1 %
Hematocrit: 49.3 % (ref 37.5–51.0)
Hemoglobin: 16.7 g/dL (ref 13.0–17.7)
IMMATURE GRANS (ABS): 0 10*3/uL (ref 0.0–0.1)
IMMATURE GRANULOCYTES: 0 %
LYMPHS: 35 %
Lymphocytes Absolute: 3.8 10*3/uL — ABNORMAL HIGH (ref 0.7–3.1)
MCH: 30 pg (ref 26.6–33.0)
MCHC: 33.9 g/dL (ref 31.5–35.7)
MCV: 89 fL (ref 79–97)
MONOCYTES: 8 %
Monocytes Absolute: 0.8 10*3/uL (ref 0.1–0.9)
NEUTROS ABS: 6 10*3/uL (ref 1.4–7.0)
Neutrophils: 56 %
PLATELETS: 230 10*3/uL (ref 150–450)
RBC: 5.56 x10E6/uL (ref 4.14–5.80)
RDW: 14.2 % (ref 12.3–15.4)
WBC: 10.8 10*3/uL (ref 3.4–10.8)

## 2017-10-02 LAB — CMP14+EGFR
ALK PHOS: 73 IU/L (ref 39–117)
ALT: 18 IU/L (ref 0–44)
AST: 20 IU/L (ref 0–40)
Albumin/Globulin Ratio: 1.8 (ref 1.2–2.2)
Albumin: 4.4 g/dL (ref 3.5–4.7)
BUN/Creatinine Ratio: 8 — ABNORMAL LOW (ref 10–24)
BUN: 9 mg/dL (ref 8–27)
Bilirubin Total: 0.5 mg/dL (ref 0.0–1.2)
CO2: 23 mmol/L (ref 20–29)
CREATININE: 1.11 mg/dL (ref 0.76–1.27)
Calcium: 9.4 mg/dL (ref 8.6–10.2)
Chloride: 99 mmol/L (ref 96–106)
GFR calc Af Amer: 72 mL/min/{1.73_m2} (ref >59)
GFR calc non Af Amer: 62 mL/min/{1.73_m2} (ref >59)
GLOBULIN, TOTAL: 2.5 g/dL (ref 1.5–4.5)
GLUCOSE: 106 mg/dL — AB (ref 65–99)
Potassium: 4.5 mmol/L (ref 3.5–5.2)
Sodium: 139 mmol/L (ref 134–144)
Total Protein: 6.9 g/dL (ref 6.0–8.5)

## 2017-10-02 LAB — PSA, TOTAL AND FREE
PSA FREE: 0.2 ng/mL
PSA, Free Pct: 28.6 %
Prostate Specific Ag, Serum: 0.7 ng/mL (ref 0.0–4.0)

## 2017-10-02 LAB — VITAMIN D 25 HYDROXY (VIT D DEFICIENCY, FRACTURES): Vit D, 25-Hydroxy: 37.9 ng/mL (ref 30.0–100.0)

## 2017-10-10 ENCOUNTER — Encounter: Payer: Self-pay | Admitting: Family Medicine

## 2017-10-16 ENCOUNTER — Ambulatory Visit (INDEPENDENT_AMBULATORY_CARE_PROVIDER_SITE_OTHER): Payer: Medicare PPO

## 2017-10-16 VITALS — BP 138/81 | HR 87 | Temp 97.0°F | Ht 67.0 in | Wt 193.0 lb

## 2017-10-16 DIAGNOSIS — Z0000 Encounter for general adult medical examination without abnormal findings: Secondary | ICD-10-CM

## 2017-10-16 NOTE — Progress Notes (Signed)
Subjective:   Terry Winters is a 83 y.o. male who presents for an Initial Medicare Annual Wellness Visit.  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  He is married and lives at home with his wife.  They have three children, four grandchildren, and two great-grandchildren.  In his spare time, he enjoys playing golf when he can and has been collecting coins for many years.  Review of Systems   Cardiac Risk Factors include: advanced age (>73men, >92 women);dyslipidemia;hypertension;male gender    Objective:    Today's Vitals   10/16/17 1443  BP: 138/81  Pulse: 87  Temp: (!) 97 F (36.1 C)  TempSrc: Oral  Weight: 193 lb (87.5 kg)  Height: 5\' 7"  (1.702 m)   Body mass index is 30.23 kg/m.  Advanced Directives 10/16/2017 11/14/2016 11/10/2014  Does Patient Have a Medical Advance Directive? No No No  Would patient like information on creating a medical advance directive? No - Patient declined No - Patient declined No - patient declined information    Current Medications (verified) Outpatient Encounter Medications as of 10/16/2017  Medication Sig  . aspirin 81 MG tablet Take 81 mg by mouth every morning.   Marland Kitchen atorvastatin (LIPITOR) 20 MG tablet TAKE 1 TABLET EVERY DAY AT 6 PM  . cetirizine (ZYRTEC) 10 MG tablet Take 10 mg by mouth as needed for allergies.  . Cholecalciferol (VITAMIN D3 GUMMIES ADULT) 1000 units CHEW Chew by mouth daily. One to two chews daily  . docusate sodium (COLACE) 100 MG capsule Take 1 capsule (100 mg total) by mouth 2 (two) times daily.  . finasteride (PROSCAR) 5 MG tablet Take 5 mg by mouth every morning.  . fish oil-omega-3 fatty acids 1000 MG capsule Take 1 g by mouth daily. 5 days per week  . fluticasone (FLONASE) 50 MCG/ACT nasal spray Place into both nostrils as needed for allergies or rhinitis.  . LUTEIN PO Take 1 tablet by mouth 3 (three) times a week.   . Multiple Vitamins-Minerals (ZINC PO) Take 1 tablet by mouth  once a week.   . [DISCONTINUED] losartan (COZAAR) 100 MG tablet Take 1 tablet (100 mg total) by mouth daily.   No facility-administered encounter medications on file as of 10/16/2017.     Allergies (verified) Amoxicillin; Prednisone; Sulfa antibiotics; and Triamcinolone   History: Past Medical History:  Diagnosis Date  . BPH (benign prostatic hypertrophy)   . Foley catheter in place   . GERD (gastroesophageal reflux disease)    watches diet  . History of gout    as of 11-12-2016  per pt stable  . Hyperlipidemia   . Hypertension    per pt stopped his medication losartan on his own--- last taken 2nd week sept. 2018--- per pt checks bp and has been ok and watches diet  . Seasonal allergies   . Type 2 diabetes, diet controlled (HCC)   . Urinary retention   . Vitamin D deficiency   . Wears glasses   . Wears partial dentures    upper   Past Surgical History:  Procedure Laterality Date  . ANTERIOR CERVICAL DECOMP/DISCECTOMY FUSION  1993  . TONSILLECTOMY  child  . TRANSURETHRAL RESECTION OF PROSTATE N/A 11/14/2016   Procedure: TRANSURETHRAL RESECTION OF THE PROSTATE (TURP);  Surgeon: Bjorn Pippin, MD;  [facility];  Service: Urology;  Laterality: N/A;   Family History  Problem Relation Age of Onset  . ALS Father  Social History   Socioeconomic History  . Marital status: Married    Spouse name: Not on file  . Number of children: 3  . Years of education: Not on file  . Highest education level: Not on file  Occupational History  . Not on file  Social Needs  . Financial resource strain: Not hard at all  . Food insecurity:    Worry: Never true    Inability: Never true  . Transportation needs:    Medical: No    Non-medical: No  Tobacco Use  . Smoking status: Former Smoker    Packs/day: 0.50    Years: 15.00    Pack years: 7.50    Types: Cigars, Cigarettes    Last attempt to quit: 06/16/1966    Years since quitting: 51.3  . Smokeless tobacco:  Never Used  Substance and Sexual Activity  . Alcohol use: Yes    Comment: occasionally  . Drug use: No  . Sexual activity: Not on file  Lifestyle  . Physical activity:    Days per week: 5 days    Minutes per session: 50 min  . Stress: Not at all  Relationships  . Social connections:    Talks on phone: More than three times a week    Gets together: Twice a week    Attends religious service: Never    Active member of club or organization: No    Attends meetings of clubs or organizations: Never    Relationship status: Married  Other Topics Concern  . Not on file  Social History Narrative  . Not on file    Clinical Intake:  Activities of Daily Living In your present state of health, do you have any difficulty performing the following activities: 10/16/2017 11/14/2016  Hearing? N N  Vision? N N  Difficulty concentrating or making decisions? N N  Walking or climbing stairs? N N  Dressing or bathing? N N  Doing errands, shopping? N -  Preparing Food and eating ? N -  Using the Toilet? N -  In the past six months, have you accidently leaked urine? N -  Do you have problems with loss of bowel control? N -  Managing your Medications? N -  Managing your Finances? N -  Housekeeping or managing your Housekeeping? N -  Some recent data might be hidden     Immunizations and Health Maintenance Immunization History  Administered Date(s) Administered  . Influenza, High Dose Seasonal PF 12/17/2016  . Influenza,inj,Quad PF,6+ Mos 12/30/2013, 11/10/2014, 01/08/2016  . Pneumococcal Conjugate-13 01/08/2016  . Pneumococcal Polysaccharide-23 07/16/2013   Health Maintenance Due  Topic Date Due  . TETANUS/TDAP  10/06/1953  . INFLUENZA VACCINE  09/11/2017   Patient's insurance was checked today for cost of Tdap (Boostrix) and found to be $64.89.  Patient did not want to have this vaccine today because of cost, but does want to check this price again at his next visit with PCP.   Patient  Care Team: Dettinger, Elige Radon, MD as PCP - General (Family Medicine) Bjorn Pippin, MD as Attending Physician (Urology)  Indicate any recent Medical Services you may have received from other than [facility] providers in the past year (date may be approximate).    Assessment:   This is a routine wellness examination for Chaka.   Dietary issues and exercise activities discussed: Current Exercise Habits: Home exercise routine, Type of exercise: walking, Time (Minutes): 50, Frequency (Times/Week): 5, Weekly Exercise (Minutes/Week): 250, Intensity: Mild, Exercise limited  by: orthopedic condition(s)  Goals    . DIET - EAT MORE FRUITS AND VEGETABLES    . DIET - INCREASE WATER INTAKE      Depression Screen PHQ 2/9 Scores 10/16/2017 10/01/2017 09/11/2016 08/28/2016  PHQ - 2 Score 0 0 0 0    Fall Risk Fall Risk  10/16/2017 10/01/2017 09/11/2016 08/28/2016 07/09/2016  Falls in the past year? No No No No No  Number falls in past yr: - - - - -  Risk for fall due to : - - - - -  Risk for fall due to: Comment - - - - -    Is the patient's home free of loose throw rugs in walkways, pet beds, electrical cords, etc?   Yes      Grab bars in the bathroom? No      Handrails on the stairs?   Yes      Adequate lighting?   Yes    Cognitive Function: MMSE - Mini Mental State Exam 10/16/2017 11/10/2014  Orientation to time 5 5  Orientation to Place 5 5  Registration 3 3  Attention/ Calculation 5 5  Recall 3 3  Language- name 2 objects 2 2  Language- repeat 1 1  Language- follow 3 step command 3 3  Language- read & follow direction 1 1  Write a sentence 1 1  Copy design 1 1  Total score 30 30    Patient performed very well on the MMSE, scoring 30 out of 30 available points.    Screening Tests Health Maintenance  Topic Date Due  . TETANUS/TDAP  10/06/1953  . INFLUENZA VACCINE  09/11/2017  . PNA vac Low Risk Adult  Completed     Cancer Screenings: Lung: Low Dose CT Chest recommended if Age 3-80  years, 30 pack-year currently smoking OR have quit w/in 15years. Patient does not qualify.     Plan:     Follow up with PCP  Check cost of Tdap (Boostrix) vaccine at next office visit  I have personally reviewed and noted the following in the patient's chart:   . Medical and social history . Use of alcohol, tobacco or illicit drugs  . Current medications and supplements . Functional ability and status . Nutritional status . Physical activity . Advanced directives . List of other physicians . Hospitalizations, surgeries, and ER visits in previous 12 months . Vitals . Screenings to include cognitive, depression, and falls . Referrals and appointments  In addition, I have reviewed and discussed with patient certain preventive protocols, quality metrics, and best practice recommendations. A written personalized care plan for preventive services as well as general preventive health recommendations were provided to patient.     Ulice Brilliant, LPN   10/14/8180

## 2017-10-16 NOTE — Patient Instructions (Signed)
  Terry Winters , Thank you for taking time to come for your Medicare Wellness Visit. I appreciate your ongoing commitment to your health goals. Please review the following plan we discussed and let me know if I can assist you in the future.   These are the goals we discussed: Goals    . DIET - EAT MORE FRUITS AND VEGETABLES    . DIET - INCREASE WATER INTAKE       This is a list of the screening recommended for you and due dates:  Health Maintenance  Topic Date Due  . Tetanus Vaccine  10/06/1953  . Flu Shot  09/11/2017  . Pneumonia vaccines  Completed

## 2017-10-19 ENCOUNTER — Other Ambulatory Visit: Payer: Self-pay | Admitting: Family Medicine

## 2017-10-24 ENCOUNTER — Other Ambulatory Visit: Payer: Self-pay | Admitting: Family Medicine

## 2017-12-01 ENCOUNTER — Ambulatory Visit (INDEPENDENT_AMBULATORY_CARE_PROVIDER_SITE_OTHER): Payer: Medicare PPO

## 2017-12-01 DIAGNOSIS — Z23 Encounter for immunization: Secondary | ICD-10-CM

## 2017-12-03 ENCOUNTER — Encounter: Payer: Self-pay | Admitting: Family Medicine

## 2017-12-03 ENCOUNTER — Ambulatory Visit: Payer: Medicare PPO | Admitting: Family Medicine

## 2017-12-03 VITALS — BP 165/71 | HR 88 | Temp 98.6°F | Ht 67.0 in | Wt 195.0 lb

## 2017-12-03 DIAGNOSIS — I10 Essential (primary) hypertension: Secondary | ICD-10-CM

## 2017-12-03 DIAGNOSIS — M25511 Pain in right shoulder: Secondary | ICD-10-CM | POA: Diagnosis not present

## 2017-12-03 NOTE — Patient Instructions (Signed)
I think that you have an injury to your rotator cuff based on your exam today.  I placed a referral to orthopedics for further evaluation of this.  I am hoping that they will ultrasound your shoulder in the office.  Continue your current regimen.  We discussed using the hydrocodone very sparingly, as this can cause sedation.  I would like you to follow-up with Dr. Louanne Skye within the next 2 weeks for recheck of your blood pressure, it is high today.   Shoulder Impingement Syndrome Shoulder impingement syndrome is a condition that causes pain when connective tissues (tendons) surrounding the shoulder joint become pinched. These tendons are part of the group of muscles and tissues that help to stabilize the shoulder (rotator cuff). Beneath the rotator cuff is a fluid-filled sac (bursa) that allows the muscles and tendons to glide smoothly. The bursa may become swollen or irritated (bursitis). Bursitis, swelling in the rotator cuff tendons, or both conditions can decrease how much space is under a bone in the shoulder joint (acromion), resulting in impingement. What are the causes? Shoulder impingement syndrome can be caused by bursitis or swelling of the rotator cuff tendons, which may result from:  Repetitive overhead arm movements.  Falling onto the shoulder.  Weakness in the shoulder muscles.  What increases the risk? You may be more likely to develop this condition if you are an athlete who participates in:  Sports that involve throwing, such as baseball.  Tennis.  Swimming.  Volleyball.  Some people are also more likely to develop impingement syndrome because of the shape of their acromion bone. What are the signs or symptoms? The main symptom of this condition is pain on the front or side of the shoulder. Pain may:  Get worse when lifting or raising the arm.  Get worse at night.  Wake you up from sleeping.  Feel sharp when the shoulder is moved, and then fade to an  ache.  Other signs and symptoms may include:  Tenderness.  Stiffness.  Inability to raise the arm above shoulder level or behind the body.  Weakness.  How is this diagnosed? This condition may be diagnosed based on:  Your symptoms.  Your medical history.  A physical exam.  Imaging tests, such as: ? X-rays. ? MRI. ? Ultrasound.  How is this treated? Treatment for this condition may include:  Resting your shoulder and avoiding all activities that cause pain or put stress on the shoulder.  Icing your shoulder.  NSAIDs to help reduce pain and swelling.  One or more injections of medicines to numb the area and reduce inflammation.  Physical therapy.  Surgery. This may be needed if nonsurgical treatments have not helped. Surgery may involve repairing the rotator cuff, reshaping the acromion, or removing the bursa.  Follow these instructions at home: Managing pain, stiffness, and swelling  If directed, apply ice to the injured area. ? Put ice in a plastic bag. ? Place a towel between your skin and the bag. ? Leave the ice on for 20 minutes, 2-3 times a day. Activity  Rest and return to your normal activities as told by your health care provider. Ask your health care provider what activities are safe for you.  Do exercises as told by your health care provider. General instructions  Do not use any tobacco products, including cigarettes, chewing tobacco, or e-cigarettes. Tobacco can delay healing. If you need help quitting, ask your health care provider.  Ask your health care provider when it is  safe for you to drive.  Take over-the-counter and prescription medicines only as told by your health care provider.  Keep all follow-up visits as told by your health care provider. This is important. How is this prevented?  Give your body time to rest between periods of activity.  Be safe and responsible while being active to avoid falls.  Maintain physical fitness,  including strength and flexibility. Contact a health care provider if:  Your symptoms have not improved after 1-2 months of treatment and rest.  You cannot lift your arm away from your body. This information is not intended to replace advice given to you by your health care provider. Make sure you discuss any questions you have with your health care provider. Document Released: 01/28/2005 Document Revised: 10/05/2015 Document Reviewed: 12/31/2014 Elsevier Interactive Patient Education  [employer].

## 2017-12-03 NOTE — Progress Notes (Signed)
Subjective: CC: shoulder pain PCP: Dettinger, Elige Radon, MD [id number] Terry Winters is a 83 y.o. male presenting to clinic today for:  1. Shoulder pain Patient reports acute onset of right shoulder pain about 7 to 10 days ago.  He points to the superior posterior aspect of the right shoulder.  Pain waxes and wanes in severity.  He notes pain is more prominent with elevation of the right upper extremity.  He is right-hand dominant.  Denies any preceding injury.  No numbness and tingling.  No perceived weakness.  He has been using salonpas, lidocaine patches, Advil and old hydrocodone for the pain.  He reports some relief with all of these therapies.  Past surgical history significant for C-spine surgery for ruptured disc in 1993.   ROS: Per HPI  Allergies  Allergen Reactions  . Amoxicillin Rash  . Prednisone Rash  . Sulfa Antibiotics Other (See Comments)    "upset stomach"  . Triamcinolone Rash   Past Medical History:  Diagnosis Date  . BPH (benign prostatic hypertrophy)   . Foley catheter in place   . GERD (gastroesophageal reflux disease)    watches diet  . History of gout    as of 11-12-2016  per pt stable  . Hyperlipidemia   . Hypertension    per pt stopped his medication losartan on his own--- last taken 2nd week sept. 2018--- per pt checks bp and has been ok and watches diet  . Seasonal allergies   . Type 2 diabetes, diet controlled (HCC)   . Urinary retention   . Vitamin D deficiency   . Wears glasses   . Wears partial dentures    upper    Current Outpatient Medications:  .  aspirin 81 MG tablet, Take 81 mg by mouth every morning. , Disp: , Rfl:  .  atorvastatin (LIPITOR) 20 MG tablet, TAKE 1 TABLET EVERY DAY AT 6 PM, Disp: 30 tablet, Rfl: 4 .  cetirizine (ZYRTEC) 10 MG tablet, Take 10 mg by mouth as needed for allergies., Disp: , Rfl:  .  Cholecalciferol (VITAMIN D3 GUMMIES ADULT) 1000 units CHEW, Chew by mouth daily. One to two chews daily, Disp: , Rfl:  .  docusate  sodium (COLACE) 100 MG capsule, Take 1 capsule (100 mg total) by mouth 2 (two) times daily., Disp: 60 capsule, Rfl: 2 .  finasteride (PROSCAR) 5 MG tablet, Take 5 mg by mouth every morning., Disp: , Rfl:  .  fish oil-omega-3 fatty acids 1000 MG capsule, Take 1 g by mouth daily. 5 days per week, Disp: , Rfl:  .  fluticasone (FLONASE) 50 MCG/ACT nasal spray, Place into both nostrils as needed for allergies or rhinitis., Disp: , Rfl:  .  LUTEIN PO, Take 1 tablet by mouth 3 (three) times a week. , Disp: , Rfl:  .  Multiple Vitamins-Minerals (ZINC PO), Take 1 tablet by mouth once a week. , Disp: , Rfl:  Social History   Socioeconomic History  . Marital status: Married    Spouse name: Not on file  . Number of children: 3  . Years of education: Not on file  . Highest education level: Not on file  Occupational History  . Not on file  Social Needs  . Financial resource strain: Not hard at all  . Food insecurity:    Worry: Never true    Inability: Never true  . Transportation needs:    Medical: No    Non-medical: No  Tobacco Use  . Smoking status:  Former Smoker    Packs/day: 0.50    Years: 15.00    Pack years: 7.50    Types: Cigars, Cigarettes    Last attempt to quit: 06/16/1966    Years since quitting: 51.5  . Smokeless tobacco: Never Used  Substance and Sexual Activity  . Alcohol use: Yes    Comment: occasionally  . Drug use: No  . Sexual activity: Not on file  Lifestyle  . Physical activity:    Days per week: 5 days    Minutes per session: 50 min  . Stress: Not at all  Relationships  . Social connections:    Talks on phone: More than three times a week    Gets together: Twice a week    Attends religious service: Never    Active member of club or organization: No    Attends meetings of clubs or organizations: Never    Relationship status: Married  . Intimate partner violence:    Fear of current or ex partner: Not on file    Emotionally abused: Not on file    Physically  abused: Not on file    Forced sexual activity: Not on file  Other Topics Concern  . Not on file  Social History Narrative  . Not on file   Family History  Problem Relation Age of Onset  . ALS Father     Objective: Office vital signs reviewed. BP (!) 165/71   Pulse 88   Temp 98.6 F (37 C) (Oral)   Ht 5\' 7"  (1.702 m)   Wt 195 lb (88.5 kg)   BMI 30.54 kg/m   Physical Examination:  General: Awake, alert, well nourished, No acute distress Extremities: warm, well perfused, No edema, cyanosis or clubbing; +2 pulses bilaterally MSK:   Right shoulder: Patient unable to raise right upper extremity above shoulder level.  Positive painful arc sign.  Negative Hawkins and negative empty can.  He has mild tenderness to palpation to the posterior lateral aspect of the shoulder.  No palpable bony abnormalities.  No visible defects. Skin: dry; intact; no rashes or lesions Neuro: 4/5 Strength in RUE. Light touch sensation grossly intact  Assessment/ Plan: 83 y.o. male   1. Acute pain of right shoulder I question shoulder impingement given painful arc sign.  I considered injecting the shoulder today but patient has history of rash with oral prednisone and declined this today.  I placed a referral to orthopedic surgery for further evaluation of the shoulder.  Would consider evaluation of the rotator cuff under ultrasound.  Patient to follow-up PRN. - Ambulatory referral to Orthopedic Surgery  2. Elevated blood pressure reading in office with diagnosis of hypertension Patient to follow-up with PCP in the next 2 weeks for blood pressure recheck.  Suspect elevation may be related to oral NSAIDs and acute pain.   Orders Placed This Encounter  Procedures  . Ambulatory referral to Orthopedic Surgery    Referral Priority:   Routine    Referral Type:   Surgical    Referral Reason:   Specialty Services Required    Requested Specialty:   Orthopedic Surgery    Number of Visits Requested:   1   No  orders of the defined types were placed in this encounter.    Terry Ip, DO [facility] [phone]

## 2017-12-08 DIAGNOSIS — S46011A Strain of muscle(s) and tendon(s) of the rotator cuff of right shoulder, initial encounter: Secondary | ICD-10-CM | POA: Diagnosis not present

## 2017-12-08 DIAGNOSIS — M19011 Primary osteoarthritis, right shoulder: Secondary | ICD-10-CM | POA: Diagnosis not present

## 2017-12-12 ENCOUNTER — Telehealth: Payer: Self-pay | Admitting: Family Medicine

## 2017-12-12 MED ORDER — ZOLPIDEM TARTRATE 5 MG PO TABS: 5.0000 mg | ORAL_TABLET | Freq: Every evening | ORAL | 1 refills | Status: DC | PRN

## 2017-12-12 NOTE — Telephone Encounter (Signed)
Patient states that the last 3-4 years he has been taking Ambien on and off. Sates since his rotator cuff surgery in October patient has been having trouble sleeping and would like Ambien sent to his pharmacy.  Please advise

## 2017-12-12 NOTE — Telephone Encounter (Signed)
I sent Ambien for the patient

## 2017-12-12 NOTE — Telephone Encounter (Signed)
lmtcb

## 2017-12-12 NOTE — Telephone Encounter (Signed)
lmtcb- not on med list

## 2017-12-12 NOTE — Telephone Encounter (Signed)
Patient aware.

## 2017-12-26 ENCOUNTER — Ambulatory Visit: Payer: Self-pay

## 2017-12-27 ENCOUNTER — Encounter: Payer: Self-pay | Admitting: Physician Assistant

## 2017-12-27 ENCOUNTER — Ambulatory Visit: Payer: Medicare PPO | Admitting: Physician Assistant

## 2017-12-27 VITALS — BP 154/79 | HR 89 | Temp 97.4°F | Ht 67.0 in | Wt 188.0 lb

## 2017-12-27 DIAGNOSIS — M25512 Pain in left shoulder: Secondary | ICD-10-CM | POA: Diagnosis not present

## 2017-12-27 DIAGNOSIS — G8929 Other chronic pain: Secondary | ICD-10-CM

## 2017-12-27 DIAGNOSIS — M7581 Other shoulder lesions, right shoulder: Secondary | ICD-10-CM

## 2017-12-27 MED ORDER — NAPROXEN 375 MG PO TABS: 375.0000 mg | ORAL_TABLET | Freq: Two times a day (BID) | ORAL | 0 refills | Status: DC

## 2017-12-27 NOTE — Patient Instructions (Addendum)
Glucosamine chondroitin  Shoulder Exercises Ask your health care provider which exercises are safe for you. Do exercises exactly as told by your health care provider and adjust them as directed. It is normal to feel mild stretching, pulling, tightness, or discomfort as you do these exercises, but you should stop right away if you feel sudden pain or your pain gets worse.Do not begin these exercises until told by your health care provider. RANGE OF MOTION EXERCISES These exercises warm up your muscles and joints and improve the movement and flexibility of your shoulder. These exercises also help to relieve pain, numbness, and tingling. These exercises involve stretching your injured shoulder directly. Exercise A: Pendulum  1. Stand near a wall or a surface that you can hold onto for balance. 2. Bend at the waist and let your left / right arm hang straight down. Use your other arm to support you. Keep your back straight and do not lock your knees. 3. Relax your left / right arm and shoulder muscles, and move your hips and your trunk so your left / right arm swings freely. Your arm should swing because of the motion of your body, not because you are using your arm or shoulder muscles. 4. Keep moving your body so your arm swings in the following directions, as told by your health care provider: ? Side to side. ? Forward and backward. ? In clockwise and counterclockwise circles. 5. Continue each motion for __________ seconds, or for as long as told by your health care provider. 6. Slowly return to the starting position. Repeat __________ times. Complete this exercise __________ times a day. Exercise B:Flexion, Standing  1. Stand and hold a broomstick, a cane, or a similar object. Place your hands a little more than shoulder-width apart on the object. Your left / right hand should be palm-up, and your other hand should be palm-down. 2. Keep your elbow straight and keep your shoulder muscles relaxed.  Push the stick down with your healthy arm to raise your left / right arm in front of your body, and then over your head until you feel a stretch in your shoulder. ? Avoid shrugging your shoulder while you raise your arm. Keep your shoulder blade tucked down toward the middle of your back. 3. Hold for __________ seconds. 4. Slowly return to the starting position. Repeat __________ times. Complete this exercise __________ times a day. Exercise C: Abduction, Standing 1. Stand and hold a broomstick, a cane, or a similar object. Place your hands a little more than shoulder-width apart on the object. Your left / right hand should be palm-up, and your other hand should be palm-down. 2. While keeping your elbow straight and your shoulder muscles relaxed, push the stick across your body toward your left / right side. Raise your left / right arm to the side of your body and then over your head until you feel a stretch in your shoulder. ? Do not raise your arm above shoulder height, unless your health care provider tells you to do that. ? Avoid shrugging your shoulder while you raise your arm. Keep your shoulder blade tucked down toward the middle of your back. 3. Hold for __________ seconds. 4. Slowly return to the starting position. Repeat __________ times. Complete this exercise __________ times a day. Exercise D:Internal Rotation  1. Place your left / right hand behind your back, palm-up. 2. Use your other hand to dangle an exercise band, a towel, or a similar object over your shoulder. Grasp  the band with your left / right hand so you are holding onto both ends. 3. Gently pull up on the band until you feel a stretch in the front of your left / right shoulder. ? Avoid shrugging your shoulder while you raise your arm. Keep your shoulder blade tucked down toward the middle of your back. 4. Hold for __________ seconds. 5. Release the stretch by letting go of the band and lowering your hands. Repeat  __________ times. Complete this exercise __________ times a day. STRETCHING EXERCISES These exercises warm up your muscles and joints and improve the movement and flexibility of your shoulder. These exercises also help to relieve pain, numbness, and tingling. These exercises are done using your healthy shoulder to help stretch the muscles of your injured shoulder. Exercise E: Research officer, political party (External Rotation and Abduction)  1. Stand in a doorway with one of your feet slightly in front of the other. This is called a staggered stance. If you cannot reach your forearms to the door frame, stand facing a corner of a room. 2. Choose one of the following positions as told by your health care provider: ? Place your hands and forearms on the door frame above your head. ? Place your hands and forearms on the door frame at the height of your head. ? Place your hands on the door frame at the height of your elbows. 3. Slowly move your weight onto your front foot until you feel a stretch across your chest and in the front of your shoulders. Keep your head and chest upright and keep your abdominal muscles tight. 4. Hold for __________ seconds. 5. To release the stretch, shift your weight to your back foot. Repeat __________ times. Complete this stretch __________ times a day. Exercise F:Extension, Standing 1. Stand and hold a broomstick, a cane, or a similar object behind your back. ? Your hands should be a little wider than shoulder-width apart. ? Your palms should face away from your back. 2. Keeping your elbows straight and keeping your shoulder muscles relaxed, move the stick away from your body until you feel a stretch in your shoulder. ? Avoid shrugging your shoulders while you move the stick. Keep your shoulder blade tucked down toward the middle of your back. 3. Hold for __________ seconds. 4. Slowly return to the starting position. Repeat __________ times. Complete this exercise __________ times a  day. STRENGTHENING EXERCISES These exercises build strength and endurance in your shoulder. Endurance is the ability to use your muscles for a long time, even after they get tired. Exercise G:External Rotation  1. Sit in a stable chair without armrests. 2. Secure an exercise band at elbow height on your left / right side. 3. Place a soft object, such as a folded towel or a small pillow, between your left / right upper arm and your body to move your elbow a few inches away (about 10 cm) from your side. 4. Hold the end of the band so it is tight and there is no slack. 5. Keeping your elbow pressed against the soft object, move your left / right forearm out, away from your abdomen. Keep your body steady so only your forearm moves. 6. Hold for __________ seconds. 7. Slowly return to the starting position. Repeat __________ times. Complete this exercise __________ times a day. Exercise H:Shoulder Abduction  1. Sit in a stable chair without armrests, or stand. 2. Hold a __________ weight in your left / right hand, or hold an exercise band  with both hands. 3. Start with your arms straight down and your left / right palm facing in, toward your body. 4. Slowly lift your left / right hand out to your side. Do not lift your hand above shoulder height unless your health care provider tells you that this is safe. ? Keep your arms straight. ? Avoid shrugging your shoulder while you do this movement. Keep your shoulder blade tucked down toward the middle of your back. 5. Hold for __________ seconds. 6. Slowly lower your arm, and return to the starting position. Repeat __________ times. Complete this exercise __________ times a day. Exercise I:Shoulder Extension 1. Sit in a stable chair without armrests, or stand. 2. Secure an exercise band to a stable object in front of you where it is at shoulder height. 3. Hold one end of the exercise band in each hand. Your palms should face each  other. 4. Straighten your elbows and lift your hands up to shoulder height. 5. Step back, away from the secured end of the exercise band, until the band is tight and there is no slack. 6. Squeeze your shoulder blades together as you pull your hands down to the sides of your thighs. Stop when your hands are straight down by your sides. Do not let your hands go behind your body. 7. Hold for __________ seconds. 8. Slowly return to the starting position. Repeat __________ times. Complete this exercise __________ times a day. Exercise J:Standing Shoulder Row 1. Sit in a stable chair without armrests, or stand. 2. Secure an exercise band to a stable object in front of you so it is at waist height. 3. Hold one end of the exercise band in each hand. Your palms should be in a thumbs-up position. 4. Bend each of your elbows to an "L" shape (about 90 degrees) and keep your upper arms at your sides. 5. Step back until the band is tight and there is no slack. 6. Slowly pull your elbows back behind you. 7. Hold for __________ seconds. 8. Slowly return to the starting position. Repeat __________ times. Complete this exercise __________ times a day. Exercise K:Shoulder Press-Ups  1. Sit in a stable chair that has armrests. Sit upright, with your feet flat on the floor. 2. Put your hands on the armrests so your elbows are bent and your fingers are pointing forward. Your hands should be about even with the sides of your body. 3. Push down on the armrests and use your arms to lift yourself off of the chair. Straighten your elbows and lift yourself up as much as you comfortably can. ? Move your shoulder blades down, and avoid letting your shoulders move up toward your ears. ? Keep your feet on the ground. As you get stronger, your feet should support less of your body weight as you lift yourself up. 4. Hold for __________ seconds. 5. Slowly lower yourself back into the chair. Repeat __________ times.  Complete this exercise __________ times a day. Exercise L: Wall Push-Ups  1. Stand so you are facing a stable wall. Your feet should be about one arm-length away from the wall. 2. Lean forward and place your palms on the wall at shoulder height. 3. Keep your feet flat on the floor as you bend your elbows and lean forward toward the wall. 4. Hold for __________ seconds. 5. Straighten your elbows to push yourself back to the starting position. Repeat __________ times. Complete this exercise __________ times a day. This information is not intended  to replace advice given to you by your health care provider. Make sure you discuss any questions you have with your health care provider. Document Released: 12/12/2004 Document Revised: 10/23/2015 Document Reviewed: 10/09/2014 Elsevier Interactive Patient Education  2018 [employer]

## 2017-12-27 NOTE — Progress Notes (Signed)
BP (!) 154/79 (BP Location: Left Arm)   Pulse 89   Temp (!) 97.4 F (36.3 C) (Oral)   Ht '5\' 7"'  (1.702 m)   Wt 188 lb (85.3 kg)   BMI 29.44 kg/m    Subjective:    Patient ID: Terry Winters, male    DOB: 05/06/1934, 83 y.o.   MRN: [id number]  HPI: Terry Winters is a 83 y.o. male presenting on 12/27/2017 for bilateral shoulder pain (left worse today (seen ortho for Right recently))  This patient comes in with left shoulder pain and arthritis.  He has known right shoulder spurs and rotator cuff issues.  He was seen agrees orthopedics.  He states that this site is starting to feel better but his left side is hurting significantly whenever he moves around.  He has had no new injury.  Past Medical History:  Diagnosis Date  . BPH (benign prostatic hypertrophy)   . Foley catheter in place   . GERD (gastroesophageal reflux disease)    watches diet  . History of gout    as of 11-12-2016  per pt stable  . Hyperlipidemia   . Hypertension    per pt stopped his medication losartan on his own--- last taken 2nd week sept. 2018--- per pt checks bp and has been ok and watches diet  . Seasonal allergies   . Type 2 diabetes, diet controlled ([place])   . Urinary retention   . Vitamin D deficiency   . Wears glasses   . Wears partial dentures    upper   Relevant past medical, surgical, family and social history reviewed and updated as indicated. Interim medical history since our last visit reviewed. Allergies and medications reviewed and updated. DATA REVIEWED: CHART IN EPIC  Family History reviewed for pertinent findings.  Review of Systems  Constitutional: Negative.  Negative for appetite change and fatigue.  Eyes: Negative for pain and visual disturbance.  Respiratory: Negative.  Negative for cough, chest tightness, shortness of breath and wheezing.   Cardiovascular: Negative.  Negative for chest pain, palpitations and leg swelling.  Gastrointestinal: Negative.  Negative for abdominal pain,  diarrhea, nausea and vomiting.  Genitourinary: Negative.   Skin: Negative.  Negative for color change and rash.  Neurological: Negative.  Negative for weakness, numbness and headaches.  Psychiatric/Behavioral: Negative.     Allergies as of 12/27/2017      Reactions   Amoxicillin Rash   Prednisone Rash   Sulfa Antibiotics Other (See Comments)   "upset stomach"   Triamcinolone Rash      Medication List        Accurate as of 12/27/17 12:04 PM. Always use your most recent med list.          aspirin 81 MG tablet Take 81 mg by mouth every morning.   atorvastatin 20 MG tablet Commonly known as:  LIPITOR TAKE 1 TABLET EVERY DAY AT 6 PM   cetirizine 10 MG tablet Commonly known as:  ZYRTEC Take 10 mg by mouth as needed for allergies.   docusate sodium 100 MG capsule Commonly known as:  COLACE Take 1 capsule (100 mg total) by mouth 2 (two) times daily.   finasteride 5 MG tablet Commonly known as:  PROSCAR Take 5 mg by mouth every morning.   fish oil-omega-3 fatty acids 1000 MG capsule Take 1 g by mouth daily. 5 days per week   fluticasone 50 MCG/ACT nasal spray Commonly known as:  FLONASE Place into both nostrils as needed for allergies  or rhinitis.   LUTEIN PO Take 1 tablet by mouth 3 (three) times a week.   naproxen 375 MG tablet Commonly known as:  NAPROSYN Take 1 tablet (375 mg total) by mouth 2 (two) times daily with a meal.   VITAMIN D3 GUMMIES ADULT 25 MCG (1000 UT) Chew Generic drug:  Cholecalciferol Chew by mouth daily. One to two chews daily   ZINC PO Take 1 tablet by mouth once a week.   zolpidem 5 MG tablet Commonly known as:  AMBIEN Take 1 tablet (5 mg total) by mouth at bedtime as needed for sleep.          Objective:    BP (!) 154/79 (BP Location: Left Arm)   Pulse 89   Temp (!) 97.4 F (36.3 C) (Oral)   Ht '5\' 7"'  (1.702 m)   Wt 188 lb (85.3 kg)   BMI 29.44 kg/m   Allergies  Allergen Reactions  . Amoxicillin Rash  . Prednisone  Rash  . Sulfa Antibiotics Other (See Comments)    "upset stomach"  . Triamcinolone Rash    Wt Readings from Last 3 Encounters:  12/27/17 188 lb (85.3 kg)  12/03/17 195 lb (88.5 kg)  10/16/17 193 lb (87.5 kg)    Physical Exam  Results for orders placed or performed in visit on 10/01/17  VITAMIN D 25 Hydroxy (Vit-D Deficiency, Fractures)  Result Value Ref Range   Vit D, 25-Hydroxy 37.9 30.0 - 100.0 ng/mL  PSA, total and free  Result Value Ref Range   Prostate Specific Ag, Serum 0.7 0.0 - 4.0 ng/mL   PSA, Free 0.20 N/A ng/mL   PSA, Free Pct 28.6 %  CBC with Differential/Platelet  Result Value Ref Range   WBC 10.8 3.4 - 10.8 x10E3/uL   RBC 5.56 4.14 - 5.80 x10E6/uL   Hemoglobin 16.7 13.0 - 17.7 g/dL   Hematocrit 49.3 37.5 - 51.0 %   MCV 89 79 - 97 fL   MCH 30.0 26.6 - 33.0 pg   MCHC 33.9 31.5 - 35.7 g/dL   RDW 14.2 12.3 - 15.4 %   Platelets 230 150 - 450 x10E3/uL   Neutrophils 56 Not Estab. %   Lymphs 35 Not Estab. %   Monocytes 8 Not Estab. %   Eos 1 Not Estab. %   Basos 0 Not Estab. %   Neutrophils Absolute 6.0 1.4 - 7.0 x10E3/uL   Lymphocytes Absolute 3.8 (H) 0.7 - 3.1 x10E3/uL   Monocytes Absolute 0.8 0.1 - 0.9 x10E3/uL   EOS (ABSOLUTE) 0.1 0.0 - 0.4 x10E3/uL   Basophils Absolute 0.0 0.0 - 0.2 x10E3/uL   Immature Granulocytes 0 Not Estab. %   Immature Grans (Abs) 0.0 0.0 - 0.1 x10E3/uL  CMP14+EGFR  Result Value Ref Range   Glucose 106 (H) 65 - 99 mg/dL   BUN 9 8 - 27 mg/dL   Creatinine, Ser 1.11 0.76 - 1.27 mg/dL   GFR calc non Af Amer 62 >59 mL/min/1.73   GFR calc Af Amer 72 >59 mL/min/1.73   BUN/Creatinine Ratio 8 (L) 10 - 24   Sodium 139 134 - 144 mmol/L   Potassium 4.5 3.5 - 5.2 mmol/L   Chloride 99 96 - 106 mmol/L   CO2 23 20 - 29 mmol/L   Calcium 9.4 8.6 - 10.2 mg/dL   Total Protein 6.9 6.0 - 8.5 g/dL   Albumin 4.4 3.5 - 4.7 g/dL   Globulin, Total 2.5 1.5 - 4.5 g/dL   Albumin/Globulin Ratio 1.8 1.2 -  2.2   Bilirubin Total 0.5 0.0 - 1.2 mg/dL    Alkaline Phosphatase 73 39 - 117 IU/L   AST 20 0 - 40 IU/L   ALT 18 0 - 44 IU/L  Lipid panel  Result Value Ref Range   Cholesterol, Total 175 100 - 199 mg/dL   Triglycerides 137 0 - 149 mg/dL   HDL 47 >39 mg/dL   VLDL Cholesterol Cal 27 5 - 40 mg/dL   LDL Calculated 101 (H) 0 - 99 mg/dL   Chol/HDL Ratio 3.7 0.0 - 5.0 ratio      Assessment & Plan:   1. Chronic left shoulder pain - naproxen (NAPROSYN) 375 MG tablet; Take 1 tablet (375 mg total) by mouth 2 (two) times daily with a meal.  Dispense: 20 tablet; Refill: 0  2. AC (acromioclavicular) joint bone spurs, right - naproxen (NAPROSYN) 375 MG tablet; Take 1 tablet (375 mg total) by mouth 2 (two) times daily with a meal.  Dispense: 20 tablet; Refill: 0   Continue all other maintenance medications as listed above.  Follow up plan: No follow-ups on file.  Educational handout given for [place] PA-C [place] [address]  [place], [place] [address] [phone]   12/27/2017, 12:04 PM

## 2017-12-29 DIAGNOSIS — G8929 Other chronic pain: Secondary | ICD-10-CM | POA: Insufficient documentation

## 2017-12-29 DIAGNOSIS — M25512 Pain in left shoulder: Principal | ICD-10-CM

## 2017-12-29 DIAGNOSIS — M7581 Other shoulder lesions, right shoulder: Secondary | ICD-10-CM | POA: Insufficient documentation

## 2018-01-07 ENCOUNTER — Telehealth: Payer: Self-pay | Admitting: Family Medicine

## 2018-01-07 DIAGNOSIS — M7581 Other shoulder lesions, right shoulder: Secondary | ICD-10-CM

## 2018-01-07 DIAGNOSIS — G8929 Other chronic pain: Secondary | ICD-10-CM

## 2018-01-07 DIAGNOSIS — M25512 Pain in left shoulder: Principal | ICD-10-CM

## 2018-01-07 MED ORDER — NAPROXEN 375 MG PO TABS: 375.0000 mg | ORAL_TABLET | Freq: Two times a day (BID) | ORAL | 1 refills | Status: DC

## 2018-01-07 NOTE — Telephone Encounter (Signed)
Aware.  Script sent in. 

## 2018-01-07 NOTE — Telephone Encounter (Signed)
Ordered naprosyn refill Arville CareJoshua Dettinger, MD [facility] 01/07/2018, 1:09 PM

## 2018-01-07 NOTE — Telephone Encounter (Signed)
Seen 12-27-17 by Dione PloverA. Jones for shoulder pain and was given 20 naprosyn pills.   Please advise on refill.

## 2018-01-16 ENCOUNTER — Encounter: Payer: Self-pay | Admitting: Family Medicine

## 2018-01-16 ENCOUNTER — Ambulatory Visit: Payer: Medicare PPO | Admitting: Family Medicine

## 2018-01-16 VITALS — BP 151/71 | HR 78 | Temp 97.4°F | Ht 67.0 in | Wt 190.0 lb

## 2018-01-16 DIAGNOSIS — M25512 Pain in left shoulder: Secondary | ICD-10-CM

## 2018-01-16 DIAGNOSIS — M7581 Other shoulder lesions, right shoulder: Secondary | ICD-10-CM | POA: Diagnosis not present

## 2018-01-16 DIAGNOSIS — G8929 Other chronic pain: Secondary | ICD-10-CM | POA: Diagnosis not present

## 2018-01-16 DIAGNOSIS — M62830 Muscle spasm of back: Secondary | ICD-10-CM | POA: Diagnosis not present

## 2018-01-16 DIAGNOSIS — M25511 Pain in right shoulder: Secondary | ICD-10-CM

## 2018-01-16 MED ORDER — NAPROXEN 375 MG PO TABS: 375.0000 mg | ORAL_TABLET | Freq: Two times a day (BID) | ORAL | 2 refills | Status: DC

## 2018-01-16 NOTE — Progress Notes (Signed)
BP (!) 151/71   Pulse 78   Temp (!) 97.4 F (36.3 C) (Oral)   Ht 5\' 7"  (1.702 m)   Wt 190 lb (86.2 kg)   BMI 29.76 kg/m    Subjective:    Patient ID: Terry Winters, male    DOB: 1934/03/17, 83 y.o.   MRN: [id number]  HPI: Terry Winters is a 83 y.o. male presenting on 01/16/2018 for Shoulder Pain (shoulder pain- bilateral . Seen Yetta Barre 11/16 and states he is a little better); Back Pain (Patient states it was bad yesterday); and Joint Pain   HPI Left and right shoulder pain, orthopedic thought it was arthritis Patient is coming in for follow-up on left and right shoulder pain and AC joint issues and bone spurs, he did see an orthopedic who said that it was most likely arthritis in both of his shoulders.  He said last night he did have really sharp pain in his lower back on the left side and he took some naproxen and he is feeling a lot better today in that area.  He says mainly it still just the shoulders that bother him a lot and have not been improving.  He denies any numbness or weakness or loss of range of motion but just has the pain in both of his shoulders and his back.  Relevant past medical, surgical, family and social history reviewed and updated as indicated. Interim medical history since our last visit reviewed. Allergies and medications reviewed and updated.  Review of Systems  Constitutional: Negative for chills and fever.  Respiratory: Negative for shortness of breath and wheezing.   Cardiovascular: Negative for chest pain and leg swelling.  Musculoskeletal: Positive for arthralgias, back pain and myalgias. Negative for gait problem and joint swelling.  Skin: Negative for rash.  All other systems reviewed and are negative.   Per HPI unless specifically indicated above   Allergies as of 01/16/2018      Reactions   Amoxicillin Rash   Prednisone Rash   Sulfa Antibiotics Other (See Comments)   "upset stomach"   Triamcinolone Rash      Medication List        Accurate as of 01/16/18  1:32 PM. Always use your most recent med list.          aspirin 81 MG tablet Take 81 mg by mouth every morning.   atorvastatin 20 MG tablet Commonly known as:  LIPITOR TAKE 1 TABLET EVERY DAY AT 6 PM   cetirizine 10 MG tablet Commonly known as:  ZYRTEC Take 10 mg by mouth as needed for allergies.   docusate sodium 100 MG capsule Commonly known as:  COLACE Take 1 capsule (100 mg total) by mouth 2 (two) times daily.   finasteride 5 MG tablet Commonly known as:  PROSCAR Take 5 mg by mouth every morning.   fish oil-omega-3 fatty acids 1000 MG capsule Take 1 g by mouth daily. 5 days per week   fluticasone 50 MCG/ACT nasal spray Commonly known as:  FLONASE Place into both nostrils as needed for allergies or rhinitis.   LUTEIN PO Take 1 tablet by mouth 3 (three) times a week.   naproxen 375 MG tablet Commonly known as:  NAPROSYN Take 1 tablet (375 mg total) by mouth 2 (two) times daily with a meal.   VITAMIN D3 GUMMIES ADULT 25 MCG (1000 UT) Chew Generic drug:  Cholecalciferol Chew by mouth daily. One to two chews daily   ZINC PO Take 1 tablet  by mouth once a week.   zolpidem 5 MG tablet Commonly known as:  AMBIEN Take 1 tablet (5 mg total) by mouth at bedtime as needed for sleep.          Objective:    BP (!) 151/71   Pulse 78   Temp (!) 97.4 F (36.3 C) (Oral)   Ht 5\' 7"  (1.702 m)   Wt 190 lb (86.2 kg)   BMI 29.76 kg/m   Wt Readings from Last 3 Encounters:  01/16/18 190 lb (86.2 kg)  12/27/17 188 lb (85.3 kg)  12/03/17 195 lb (88.5 kg)    Physical Exam  Constitutional: He is oriented to person, place, and time. He appears well-developed and well-nourished. No distress.  Eyes: Conjunctivae are normal. No scleral icterus.  Neck: Neck supple. No thyromegaly present.  Cardiovascular: Normal rate, regular rhythm, normal heart sounds and intact distal pulses.  No murmur heard. Pulmonary/Chest: Effort normal and breath sounds  normal. No respiratory distress. He has no wheezes.  Musculoskeletal: Normal range of motion. He exhibits no edema.       Right shoulder: He exhibits crepitus. He exhibits normal range of motion, no tenderness, no bony tenderness, no deformity and normal strength.       Left shoulder: He exhibits crepitus. He exhibits normal range of motion, no tenderness, no bony tenderness, no deformity and normal strength.       Lumbar back: He exhibits normal range of motion, no tenderness (No lumbar tenderness on exam today) and no bony tenderness.  Lymphadenopathy:    He has no cervical adenopathy.  Neurological: He is alert and oriented to person, place, and time. Coordination normal.  Skin: Skin is warm and dry. No rash noted. He is not diaphoretic.  Psychiatric: He has a normal mood and affect. His behavior is normal.  Nursing note and vitals reviewed.       Assessment & Plan:   Problem List Items Addressed This Visit      Musculoskeletal and Integument   AC (acromioclavicular) joint bone spurs, right   Relevant Medications   naproxen (NAPROSYN) 375 MG tablet     Other   Chronic left shoulder pain - Primary   Relevant Medications   naproxen (NAPROSYN) 375 MG tablet    Other Visit Diagnoses    Chronic right shoulder pain       Relevant Medications   naproxen (NAPROSYN) 375 MG tablet   Lumbar paraspinal muscle spasm       Patient says is better today, do heating pads and naproxen      Continue naproxen and heating pads Follow up plan: Return if symptoms worsen or fail to improve.  Counseling provided for all of the vaccine components No orders of the defined types were placed in this encounter.   Terry CareJoshua Monserath Neff, MD [facility] 01/16/2018, 1:32 PM

## 2018-04-03 ENCOUNTER — Encounter: Payer: Self-pay | Admitting: Family Medicine

## 2018-04-03 ENCOUNTER — Ambulatory Visit: Payer: Medicare PPO | Admitting: Family Medicine

## 2018-04-03 VITALS — BP 142/73 | HR 73 | Temp 96.7°F | Ht 67.0 in | Wt 190.4 lb

## 2018-04-03 DIAGNOSIS — G8929 Other chronic pain: Secondary | ICD-10-CM

## 2018-04-03 DIAGNOSIS — I10 Essential (primary) hypertension: Secondary | ICD-10-CM

## 2018-04-03 DIAGNOSIS — M25512 Pain in left shoulder: Secondary | ICD-10-CM | POA: Diagnosis not present

## 2018-04-03 DIAGNOSIS — K2100 Gastro-esophageal reflux disease with esophagitis, without bleeding: Secondary | ICD-10-CM

## 2018-04-03 DIAGNOSIS — E782 Mixed hyperlipidemia: Secondary | ICD-10-CM

## 2018-04-03 LAB — CMP14+EGFR
ALT: 15 IU/L (ref 0–44)
AST: 18 IU/L (ref 0–40)
Albumin/Globulin Ratio: 1.6 (ref 1.2–2.2)
Albumin: 4.1 g/dL (ref 3.6–4.6)
Alkaline Phosphatase: 103 IU/L (ref 39–117)
BUN/Creatinine Ratio: 10 (ref 10–24)
BUN: 10 mg/dL (ref 8–27)
Bilirubin Total: 0.4 mg/dL (ref 0.0–1.2)
CALCIUM: 9.5 mg/dL (ref 8.6–10.2)
CO2: 25 mmol/L (ref 20–29)
Chloride: 98 mmol/L (ref 96–106)
Creatinine, Ser: 0.96 mg/dL (ref 0.76–1.27)
GFR calc Af Amer: 85 mL/min/{1.73_m2} (ref >59)
GFR calc non Af Amer: 74 mL/min/{1.73_m2} (ref >59)
Globulin, Total: 2.6 g/dL (ref 1.5–4.5)
Glucose: 82 mg/dL (ref 65–99)
Potassium: 4.3 mmol/L (ref 3.5–5.2)
Sodium: 136 mmol/L (ref 134–144)
Total Protein: 6.7 g/dL (ref 6.0–8.5)

## 2018-04-03 LAB — CBC WITH DIFFERENTIAL/PLATELET
Basophils Absolute: 0.1 10*3/uL (ref 0.0–0.2)
Basos: 0 %
EOS (ABSOLUTE): 0.3 10*3/uL (ref 0.0–0.4)
Eos: 2 %
HEMOGLOBIN: 14.3 g/dL (ref 13.0–17.7)
Hematocrit: 44.8 % (ref 37.5–51.0)
IMMATURE GRANS (ABS): 0.1 10*3/uL (ref 0.0–0.1)
Immature Granulocytes: 1 %
LYMPHS: 27 %
Lymphocytes Absolute: 3.8 10*3/uL — ABNORMAL HIGH (ref 0.7–3.1)
MCH: 26.1 pg — ABNORMAL LOW (ref 26.6–33.0)
MCHC: 31.9 g/dL (ref 31.5–35.7)
MCV: 82 fL (ref 79–97)
Monocytes Absolute: 1.3 10*3/uL — ABNORMAL HIGH (ref 0.1–0.9)
Monocytes: 9 %
NEUTROS PCT: 61 %
Neutrophils Absolute: 8.5 10*3/uL — ABNORMAL HIGH (ref 1.4–7.0)
Platelets: 333 10*3/uL (ref 150–450)
RBC: 5.47 x10E6/uL (ref 4.14–5.80)
RDW: 14 % (ref 11.6–15.4)
WBC: 14 10*3/uL — ABNORMAL HIGH (ref 3.4–10.8)

## 2018-04-03 LAB — LIPID PANEL
Chol/HDL Ratio: 2.6 ratio (ref 0.0–5.0)
Cholesterol, Total: 115 mg/dL (ref 100–199)
HDL: 44 mg/dL (ref >39)
LDL Calculated: 51 mg/dL (ref 0–99)
Triglycerides: 99 mg/dL (ref 0–149)
VLDL Cholesterol Cal: 20 mg/dL (ref 5–40)

## 2018-04-03 MED ORDER — FINASTERIDE 5 MG PO TABS: 5.0000 mg | ORAL_TABLET | Freq: Every morning | ORAL | 3 refills | Status: DC

## 2018-04-03 MED ORDER — ATORVASTATIN CALCIUM 20 MG PO TABS: ORAL_TABLET | ORAL | 3 refills | Status: DC

## 2018-04-03 MED ORDER — ZOLPIDEM TARTRATE 5 MG PO TABS: 5.0000 mg | ORAL_TABLET | Freq: Every evening | ORAL | 5 refills | Status: DC | PRN

## 2018-04-03 MED ORDER — NAPROXEN 375 MG PO TABS: 375.0000 mg | ORAL_TABLET | Freq: Two times a day (BID) | ORAL | 2 refills | Status: DC

## 2018-04-03 NOTE — Progress Notes (Signed)
BP (!) 142/73   Pulse 73   Temp (!) 96.7 F (35.9 C) (Oral)   Ht '5\' 7"'  (1.702 m)   Wt 190 lb 6.4 oz (86.4 kg)   BMI 29.82 kg/m    Subjective:    Patient ID: Terry Winters, male    DOB: 1934-10-12, 84 y.o.   MRN: [id number]  HPI: Terry Winters is a 84 y.o. male presenting on 04/03/2018 for Hypertension (6 month follow up)   HPI Hypertension Patient is currently on no medication currently we are just monitoring, and their blood pressure today is 142/73. Patient denies any lightheadedness or dizziness. Patient denies headaches, blurred vision, chest pains, shortness of breath, or weakness. Denies any side effects from medication and is content with current medication.   Hyperlipidemia Patient is coming in for recheck of his hyperlipidemia. The patient is currently taking Lipitor. They deny any issues with myalgias or history of liver damage from it. They deny any focal numbness or weakness or chest pain.   GERD Patient is currently on no medication currently and has been controlled and denies any major issues.  She denies any major symptoms or abdominal pain or belching or burping. She denies any blood in her stool or lightheadedness or dizziness.   Chronic shoulder pain medication refill and insomnia medication refill Patient takes naproxen for his chronic shoulder pain and arthritis and Ambien occasionally for sleep and he just needs a refill of these.  He denies any major issues with these says they have been working well for him.  He does not use the Ambien every night but only when he needs it and use the naproxen twice a day every day and he uses it with food.  Relevant past medical, surgical, family and social history reviewed and updated as indicated. Interim medical history since our last visit reviewed. Allergies and medications reviewed and updated.  Review of Systems  Constitutional: Negative for chills and fever.  Eyes: Negative for discharge.  Respiratory: Negative for  shortness of breath and wheezing.   Cardiovascular: Negative for chest pain and leg swelling.  Musculoskeletal: Negative for back pain and gait problem.  Skin: Negative for rash.  Neurological: Negative for dizziness, weakness, light-headedness and numbness.  All other systems reviewed and are negative.   Per HPI unless specifically indicated above   Allergies as of 04/03/2018      Reactions   Amoxicillin Rash   Prednisone Rash   Sulfa Antibiotics Other (See Comments)   "upset stomach"   Triamcinolone Rash      Medication List       Accurate as of April 03, 2018  9:56 AM. Always use your most recent med list.        aspirin 81 MG tablet Take 81 mg by mouth every morning.   atorvastatin 20 MG tablet Commonly known as:  LIPITOR 1 tablet daily   cetirizine 10 MG tablet Commonly known as:  ZYRTEC Take 10 mg by mouth as needed for allergies.   docusate sodium 100 MG capsule Commonly known as:  COLACE Take 1 capsule (100 mg total) by mouth 2 (two) times daily.   finasteride 5 MG tablet Commonly known as:  PROSCAR Take 1 tablet (5 mg total) by mouth every morning.   fish oil-omega-3 fatty acids 1000 MG capsule Take 1 g by mouth daily. 5 days per week   fluticasone 50 MCG/ACT nasal spray Commonly known as:  FLONASE Place into both nostrils as needed for allergies or rhinitis.  LUTEIN PO Take 1 tablet by mouth 3 (three) times a week.   naproxen 375 MG tablet Commonly known as:  NAPROSYN Take 1 tablet (375 mg total) by mouth 2 (two) times daily with a meal.   VITAMIN D3 GUMMIES ADULT 25 MCG (1000 UT) Chew Generic drug:  Cholecalciferol Chew by mouth daily. One to two chews daily   ZINC PO Take 1 tablet by mouth once a week.   zolpidem 5 MG tablet Commonly known as:  AMBIEN Take 1 tablet (5 mg total) by mouth at bedtime as needed for sleep.          Objective:    BP (!) 142/73   Pulse 73   Temp (!) 96.7 F (35.9 C) (Oral)   Ht '5\' 7"'  (1.702 m)    Wt 190 lb 6.4 oz (86.4 kg)   BMI 29.82 kg/m   Wt Readings from Last 3 Encounters:  04/03/18 190 lb 6.4 oz (86.4 kg)  01/16/18 190 lb (86.2 kg)  12/27/17 188 lb (85.3 kg)    Physical Exam Vitals signs and nursing note reviewed.  Constitutional:      General: He is not in acute distress.    Appearance: He is well-developed. He is not diaphoretic.  Eyes:     General: No scleral icterus.    Conjunctiva/sclera: Conjunctivae normal.  Neck:     Musculoskeletal: Neck supple.     Thyroid: No thyromegaly.  Cardiovascular:     Rate and Rhythm: Normal rate and regular rhythm.     Heart sounds: Normal heart sounds. No murmur.  Pulmonary:     Effort: Pulmonary effort is normal. No respiratory distress.     Breath sounds: Normal breath sounds. No wheezing.  Musculoskeletal: Normal range of motion.  Lymphadenopathy:     Cervical: No cervical adenopathy.  Skin:    General: Skin is warm and dry.     Findings: No rash.  Neurological:     Mental Status: He is alert and oriented to person, place, and time.     Coordination: Coordination normal.  Psychiatric:        Behavior: Behavior normal.        Assessment & Plan:   Problem List Items Addressed This Visit      Cardiovascular and Mediastinum   HTN (hypertension) - Primary   Relevant Medications   atorvastatin (LIPITOR) 20 MG tablet   Other Relevant Orders   CMP14+EGFR     Digestive   GERD (gastroesophageal reflux disease)   Relevant Orders   CBC with Differential/Platelet     Other   HLD (hyperlipidemia)   Relevant Medications   atorvastatin (LIPITOR) 20 MG tablet   Other Relevant Orders   Lipid panel   Chronic left shoulder pain   Relevant Medications   naproxen (NAPROSYN) 375 MG tablet      Follow up plan: Return in about 6 months (around 10/02/2018), or if symptoms worsen or fail to improve, for Hypertension and hyperlipidemia.  Counseling provided for all of the vaccine components Orders Placed This Encounter    Procedures  . CBC with Differential/Platelet  . CMP14+EGFR  . Lipid panel    Caryl Pina, MD [facility] 04/03/2018, 9:56 AM

## 2018-04-09 ENCOUNTER — Encounter: Payer: Self-pay | Admitting: *Deleted

## 2018-07-01 ENCOUNTER — Other Ambulatory Visit: Payer: Self-pay

## 2018-07-02 ENCOUNTER — Ambulatory Visit (INDEPENDENT_AMBULATORY_CARE_PROVIDER_SITE_OTHER): Payer: Medicare PPO | Admitting: Family Medicine

## 2018-07-02 ENCOUNTER — Encounter: Payer: Self-pay | Admitting: Family Medicine

## 2018-07-02 VITALS — BP 145/73 | HR 72 | Temp 97.4°F | Ht 67.0 in | Wt 189.4 lb

## 2018-07-02 DIAGNOSIS — E782 Mixed hyperlipidemia: Secondary | ICD-10-CM | POA: Diagnosis not present

## 2018-07-02 DIAGNOSIS — I10 Essential (primary) hypertension: Secondary | ICD-10-CM | POA: Diagnosis not present

## 2018-07-02 DIAGNOSIS — D72825 Bandemia: Secondary | ICD-10-CM | POA: Diagnosis not present

## 2018-07-02 MED ORDER — FLUTICASONE PROPIONATE 50 MCG/ACT NA SUSP: 2.0000 | NASAL | 6 refills | Status: DC | PRN

## 2018-07-02 NOTE — Progress Notes (Signed)
BP (!) 145/73   Pulse 72   Temp (!) 97.4 F (36.3 C) (Oral)   Ht '5\' 7"'  (1.702 m)   Wt 189 lb 6.4 oz (85.9 kg)   BMI 29.66 kg/m    Subjective:   Patient ID: Terry Winters, male    DOB: 01/24/1935, 84 y.o.   MRN: [id number]  HPI: Terry Winters is a 84 y.o. male presenting on 07/02/2018 for Hypertension (check up of chronic medical conditons)   HPI Hypertension Patient is currently on no medication and had stopped it for some time and feels like he is doing better with it, and their blood pressure today is 145/73. Patient denies any lightheadedness or dizziness. Patient denies headaches, blurred vision, chest pains, shortness of breath, or weakness. Denies any side effects from medication and is content with current medication.   Hyperlipidemia Patient is coming in for recheck of his hyperlipidemia. The patient is currently taking fish oil and Lipitor. They deny any issues with myalgias or history of liver damage from it. They deny any focal numbness or weakness or chest pain.   Elevated WBC with bandemia on the last blood check, will recheck today  Relevant past medical, surgical, family and social history reviewed and updated as indicated. Interim medical history since our last visit reviewed. Allergies and medications reviewed and updated.  Review of Systems  Constitutional: Negative for chills and fever.  Respiratory: Negative for shortness of breath and wheezing.   Cardiovascular: Negative for chest pain and leg swelling.  Musculoskeletal: Negative for back pain and gait problem.  Skin: Negative for rash.  Neurological: Negative for dizziness and headaches.  All other systems reviewed and are negative.   Per HPI unless specifically indicated above   Allergies as of 07/02/2018      Reactions   Amoxicillin Rash   Prednisone Rash   Sulfa Antibiotics Other (See Comments)   "upset stomach"   Triamcinolone Rash      Medication List       Accurate as of Jul 02, 2018   9:35 AM. If you have any questions, ask your nurse or doctor.        STOP taking these medications   LUTEIN PO Stopped by:  Fransisca Kaufmann Jamarco Zaldivar, MD     TAKE these medications   aspirin 81 MG tablet Take 81 mg by mouth every morning.   atorvastatin 20 MG tablet Commonly known as:  LIPITOR 1 tablet daily   cetirizine 10 MG tablet Commonly known as:  ZYRTEC Take 10 mg by mouth as needed for allergies.   docusate sodium 100 MG capsule Commonly known as:  Colace Take 1 capsule (100 mg total) by mouth 2 (two) times daily.   finasteride 5 MG tablet Commonly known as:  PROSCAR Take 1 tablet (5 mg total) by mouth every morning.   fish oil-omega-3 fatty acids 1000 MG capsule Take 1 g by mouth daily. 5 days per week   fluticasone 50 MCG/ACT nasal spray Commonly known as:  FLONASE Place 2 sprays into both nostrils as needed for allergies or rhinitis. What changed:  how much to take Changed by:  [name], MD   naproxen 375 MG tablet Commonly known as:  NAPROSYN Take 1 tablet (375 mg total) by mouth 2 (two) times daily with a meal.   Vitamin D3 Gummies Adult 25 MCG (1000 UT) Chew Generic drug:  Cholecalciferol Chew by mouth daily. One to two chews daily   ZINC PO Take 1 tablet by  mouth once a week.   zolpidem 5 MG tablet Commonly known as:  AMBIEN Take 1 tablet (5 mg total) by mouth at bedtime as needed for sleep.        Objective:   BP (!) 145/73   Pulse 72   Temp (!) 97.4 F (36.3 C) (Oral)   Ht '5\' 7"'  (1.702 m)   Wt 189 lb 6.4 oz (85.9 kg)   BMI 29.66 kg/m   Wt Readings from Last 3 Encounters:  07/02/18 189 lb 6.4 oz (85.9 kg)  04/03/18 190 lb 6.4 oz (86.4 kg)  01/16/18 190 lb (86.2 kg)    Physical Exam Vitals signs and nursing note reviewed.  Constitutional:      General: He is not in acute distress.    Appearance: He is well-developed. He is not diaphoretic.  Eyes:     General: No scleral icterus.    Conjunctiva/sclera: Conjunctivae  normal.  Neck:     Musculoskeletal: Neck supple.     Thyroid: No thyromegaly.  Cardiovascular:     Rate and Rhythm: Normal rate and regular rhythm.     Heart sounds: Normal heart sounds. No murmur.  Pulmonary:     Effort: Pulmonary effort is normal. No respiratory distress.     Breath sounds: Normal breath sounds. No wheezing.  Lymphadenopathy:     Cervical: No cervical adenopathy.  Skin:    General: Skin is warm and dry.     Findings: No rash.  Neurological:     Mental Status: He is alert and oriented to person, place, and time.     Coordination: Coordination normal.  Psychiatric:        Behavior: Behavior normal.       Assessment & Plan:   Problem List Items Addressed This Visit      Cardiovascular and Mediastinum   HTN (hypertension) - Primary   Relevant Orders   BMP8+EGFR     Other   HLD (hyperlipidemia)    Other Visit Diagnoses    Bandemia       Relevant Orders   CBC with Differential/Platelet       Follow up plan: Return in about 6 months (around 01/02/2019), or if symptoms worsen or fail to improve, for Blood pressure and cholesterol recheck.  Counseling provided for all of the vaccine components Orders Placed This Encounter  Procedures  . BMP8+EGFR    Caryl Pina, MD [facility] 07/02/2018, 9:35 AM

## 2018-07-03 LAB — CBC WITH DIFFERENTIAL/PLATELET
Basophils Absolute: 0 10*3/uL (ref 0.0–0.2)
Basos: 0 %
EOS (ABSOLUTE): 0.3 10*3/uL (ref 0.0–0.4)
Eos: 3 %
Hematocrit: 47.2 % (ref 37.5–51.0)
Hemoglobin: 15.6 g/dL (ref 13.0–17.7)
Immature Grans (Abs): 0 10*3/uL (ref 0.0–0.1)
Immature Granulocytes: 0 %
Lymphocytes Absolute: 3.3 10*3/uL — ABNORMAL HIGH (ref 0.7–3.1)
Lymphs: 26 %
MCH: 26.9 pg (ref 26.6–33.0)
MCHC: 33.1 g/dL (ref 31.5–35.7)
MCV: 81 fL (ref 79–97)
Monocytes Absolute: 1.1 10*3/uL — ABNORMAL HIGH (ref 0.1–0.9)
Monocytes: 9 %
Neutrophils Absolute: 8.1 10*3/uL — ABNORMAL HIGH (ref 1.4–7.0)
Neutrophils: 62 %
Platelets: 296 10*3/uL (ref 150–450)
RBC: 5.8 x10E6/uL (ref 4.14–5.80)
RDW: 14.3 % (ref 11.6–15.4)
WBC: 12.9 10*3/uL — ABNORMAL HIGH (ref 3.4–10.8)

## 2018-07-03 LAB — BMP8+EGFR
BUN/Creatinine Ratio: 14 (ref 10–24)
BUN: 14 mg/dL (ref 8–27)
CO2: 24 mmol/L (ref 20–29)
Calcium: 9.7 mg/dL (ref 8.6–10.2)
Chloride: 101 mmol/L (ref 96–106)
Creatinine, Ser: 1 mg/dL (ref 0.76–1.27)
GFR calc Af Amer: 81 mL/min/{1.73_m2} (ref >59)
GFR calc non Af Amer: 70 mL/min/{1.73_m2} (ref >59)
Glucose: 93 mg/dL (ref 65–99)
Potassium: 4.6 mmol/L (ref 3.5–5.2)
Sodium: 141 mmol/L (ref 134–144)

## 2018-07-10 ENCOUNTER — Other Ambulatory Visit: Payer: Self-pay | Admitting: Family Medicine

## 2018-07-10 DIAGNOSIS — G8929 Other chronic pain: Secondary | ICD-10-CM

## 2018-07-20 IMAGING — CT CT ABD-PELV W/ CM
2 of 5 series · 16 of 46 positions shown, 18 images · IV contrast (Isovue)
Comparison: None.

CLINICAL DATA: Abdominal pain.

EXAM:
CT ABDOMEN AND PELVIS WITH CONTRAST
TECHNIQUE: Multidetector CT imaging of the abdomen and pelvis was performed
using the standard protocol following bolus administration of
intravenous contrast.
CONTRAST:  75mL KXMME2-KMM IOPAMIDOL (KXMME2-KMM) INJECTION 61%

[Series 2: axial st · axial · 0.77mm/px · z∈[-472,-92]mm · 13 of 88 slices shown, 15 images]
[im 6/88  soft-tissue]
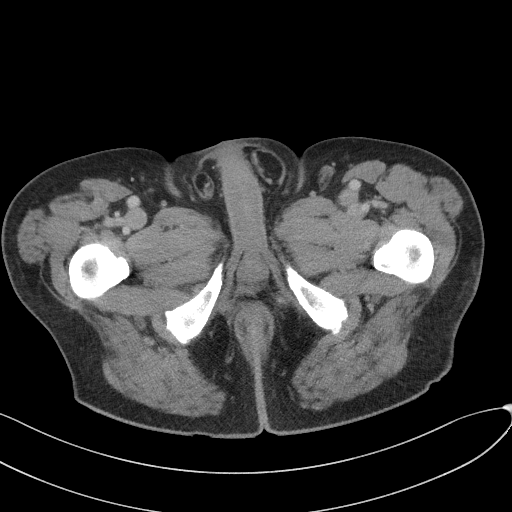
[im 6/88  bone]
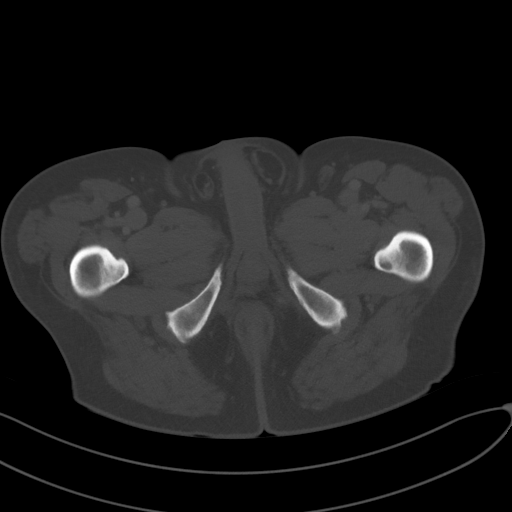
[im 11/88  soft-tissue]
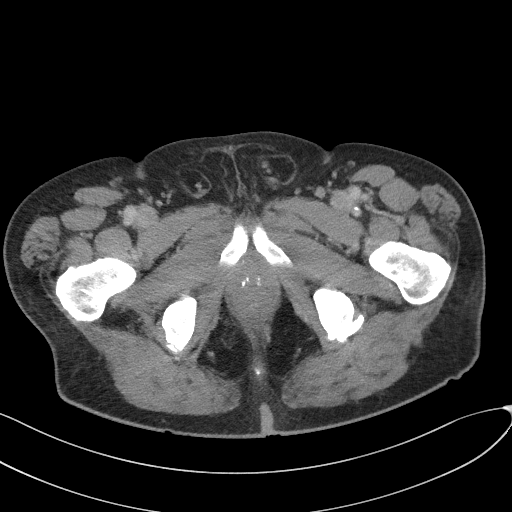
[im 21/88  soft-tissue]
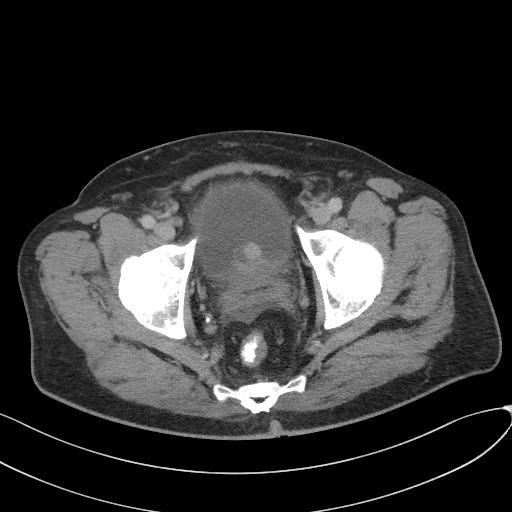
[im 26/88  soft-tissue]
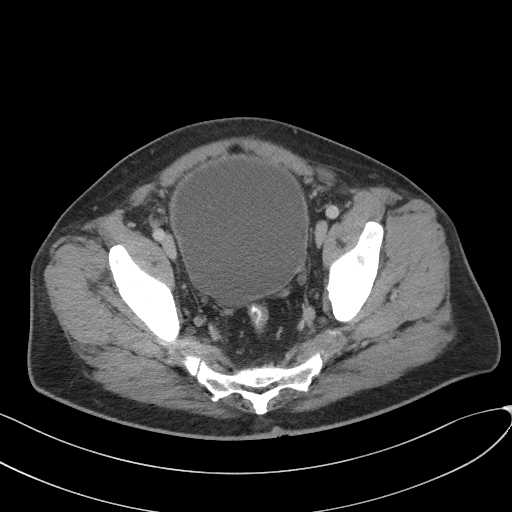
[im 31/88  soft-tissue]
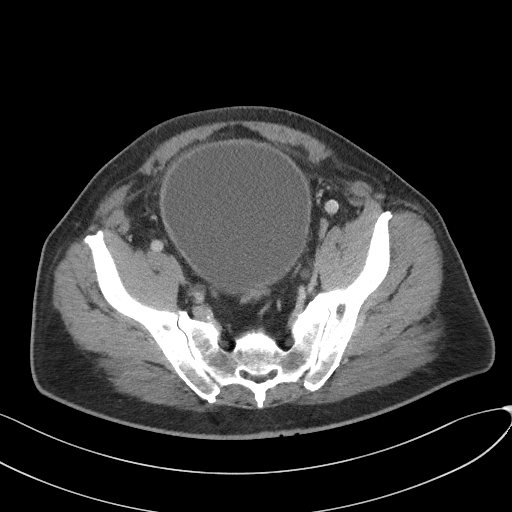
[im 36/88  soft-tissue]
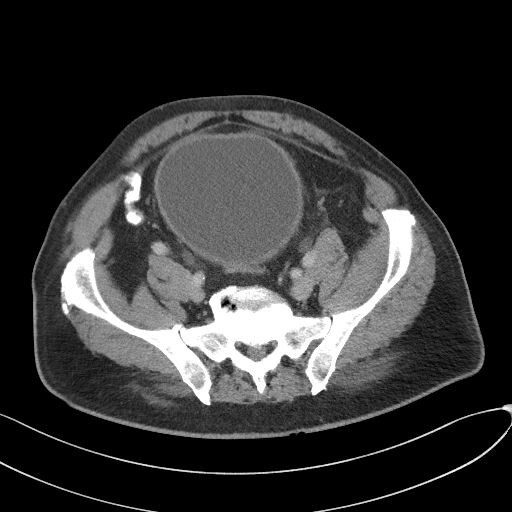
[im 47/88  soft-tissue]
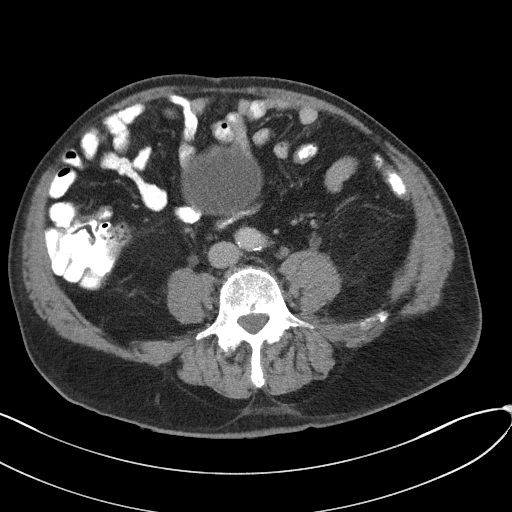
[im 52/88  soft-tissue]
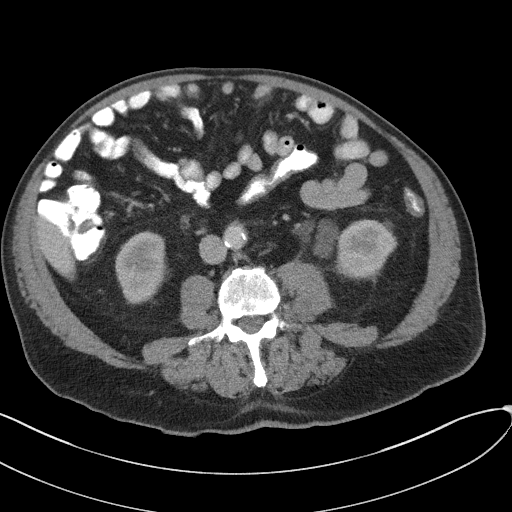
[im 57/88  soft-tissue]
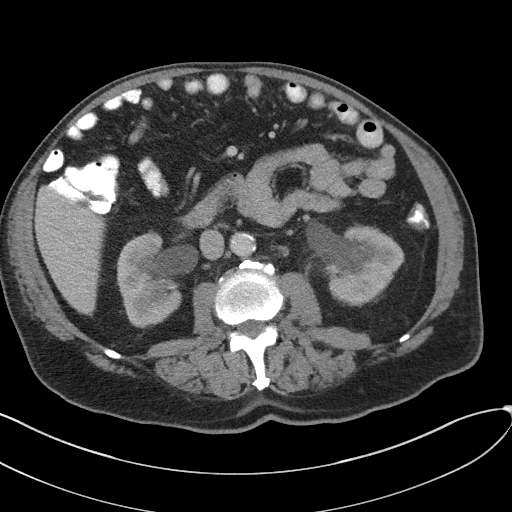
[im 57/88  bone]
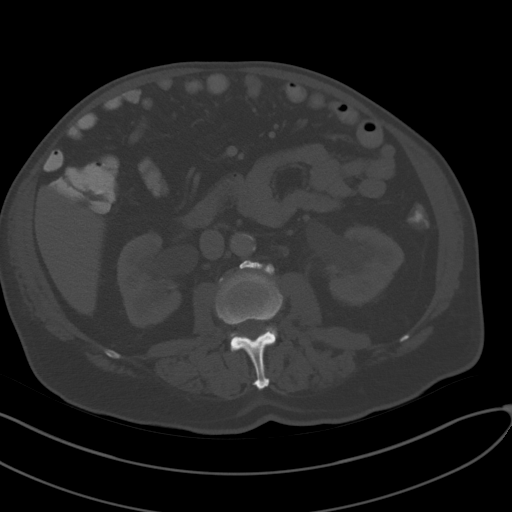
[im 62/88  soft-tissue]
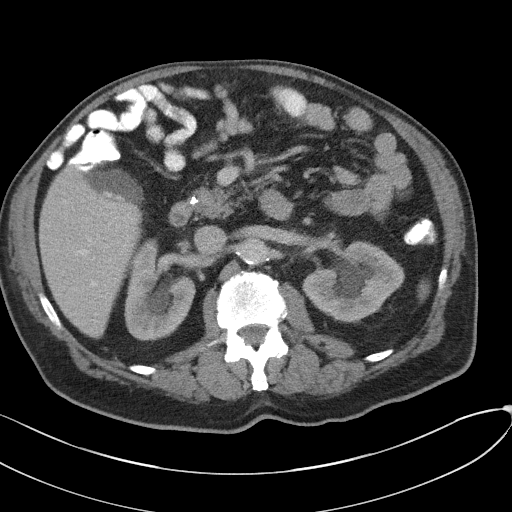
[im 67/88  soft-tissue]
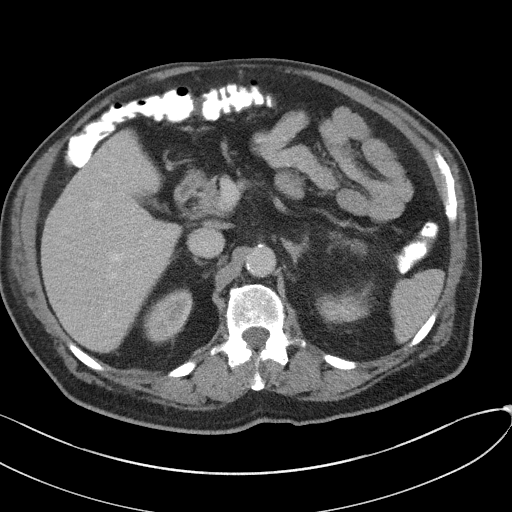
[im 77/88  soft-tissue]
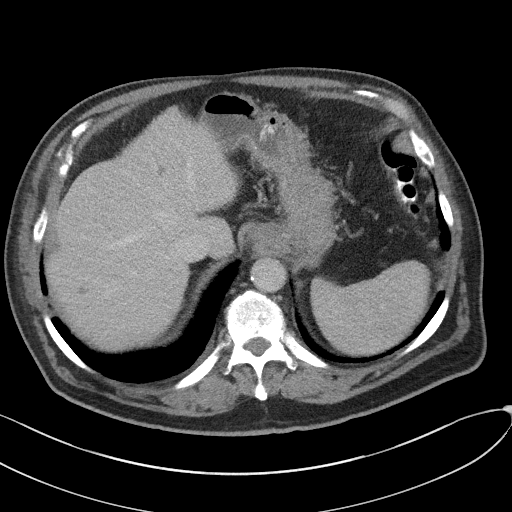
[im 82/88  soft-tissue]
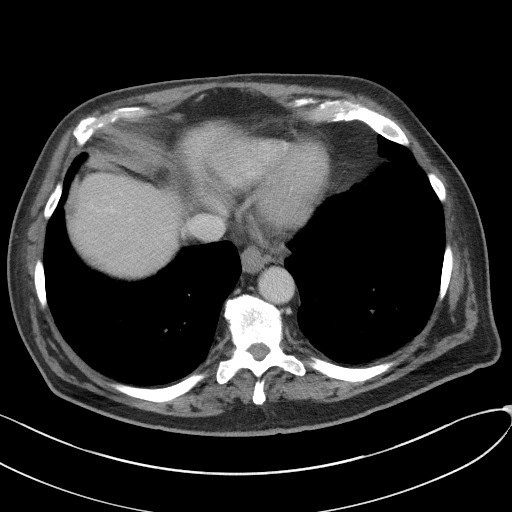

[Series 4: coronal st · coronal · 0.76mm/px · 3 of 96 slices shown]
[im 32/96  soft-tissue]
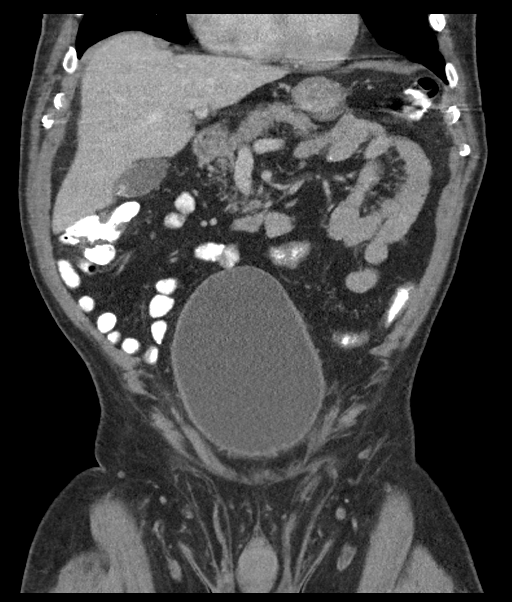
[im 43/96  soft-tissue]
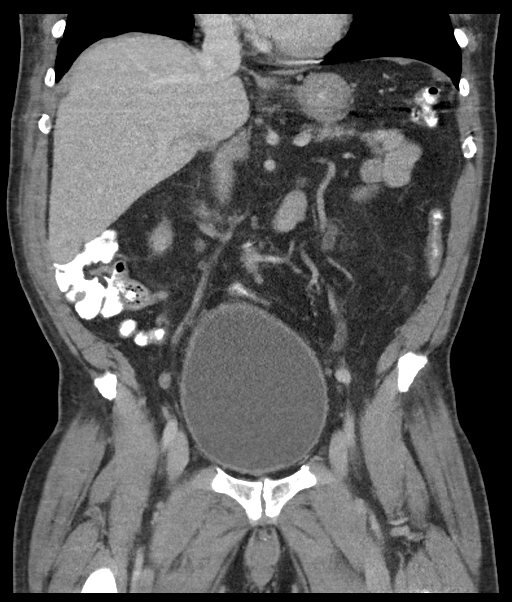
[im 53/96  soft-tissue]
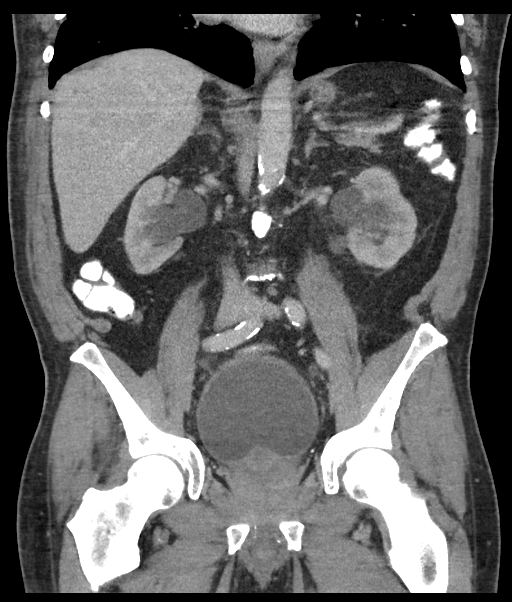

[16 of 46 positions shown; findings below may reference images not displayed]

FINDINGS: Lower chest: No acute abnormality.

Hepatobiliary: There are several low density foci within the liver.
The largest is in the right lobe measuring 8 mm. Small stones are
identified within the gallbladder, image 28 of series 2. No biliary
dilatation.

Pancreas: Calcification within the head of pancreas noted. No
pancreatic mass or ductal dilatation noted. No inflammation.

Spleen: Normal in size without focal abnormality.

Adrenals/Urinary Tract: The adrenal glands appear normal. There is
moderate scratch set there is marked distension of the urinary
bladder suggestive of chronic bladder outlet obstruction. No
suspicious focal bladder lesion identified.

Stomach/Bowel: The stomach appears normal. The small bowel loops
have a normal course and caliber. The appendix is visualized and
appears normal. Unremarkable appearance of the colon.

Vascular/Lymphatic: Aortic atherosclerosis. No aneurysm. No upper
abdominal adenopathy.

Reproductive: Nodular enlargement of the prostate gland has mass
effect upon the base of bladder.

Other: No abdominal wall hernia or abnormality. No abdominopelvic
ascites.

Musculoskeletal: Multi level disc space narrowing and ventral
endplate spurring identified. Most advanced at L5-S1. Mild
anterolisthesis of L4 on L5 noted.
IMPRESSION: 1. No acute findings identified.
2. Suspect chronic bladder outlet obstruction. Marked bladder
distention likely secondary to prostate gland hypertrophy. There is
associated bilateral hydronephrosis and hydroureter.
3. Gallstones
4.  Aortic Atherosclerosis (VENX4-A1A.A).
5. Lumbar spondylosis

## 2018-07-28 ENCOUNTER — Other Ambulatory Visit: Payer: Self-pay

## 2018-07-28 ENCOUNTER — Ambulatory Visit (INDEPENDENT_AMBULATORY_CARE_PROVIDER_SITE_OTHER): Payer: Medicare PPO | Admitting: Family Medicine

## 2018-07-28 DIAGNOSIS — S40862A Insect bite (nonvenomous) of left upper arm, initial encounter: Secondary | ICD-10-CM | POA: Diagnosis not present

## 2018-07-28 DIAGNOSIS — R21 Rash and other nonspecific skin eruption: Secondary | ICD-10-CM

## 2018-07-28 DIAGNOSIS — W57XXXA Bitten or stung by nonvenomous insect and other nonvenomous arthropods, initial encounter: Secondary | ICD-10-CM | POA: Diagnosis not present

## 2018-07-28 MED ORDER — DOXYCYCLINE HYCLATE 100 MG PO TABS: 100.0000 mg | ORAL_TABLET | Freq: Two times a day (BID) | ORAL | 0 refills | Status: DC

## 2018-07-28 NOTE — Progress Notes (Signed)
Telephone visit  Subjective: CC: tick bite PCP: Dettinger, Elige RadonJoshua A, MD ZOX:[id number]:Terry Winters is a 84 y.o. male calls for telephone consult today. Patient provides verbal consent for consult held via phone.  Location of patient: home Location of provider: [facility] Others present for call: none  1. Tick bite Patient reports an area on the left side that is red and streaky.  He was bitten by several ticks a couple of days ago but thought he had removed all of them successfully.  He has been applying peroxide to the affected area and Neosporin.  Denies any vesicular formation.  He is not sure of this rash is related to the tick bite versus he is got shingles again.  He denies any burning but does report intense itching along the area that is red and streaky.  No fevers, new arthralgias or myalgias.   ROS: Per HPI  Allergies  Allergen Reactions  . Amoxicillin Rash  . Prednisone Rash  . Sulfa Antibiotics Other (See Comments)    "upset stomach"  . Triamcinolone Rash   Past Medical History:  Diagnosis Date  . BPH (benign prostatic hypertrophy)   . Foley catheter in place   . GERD (gastroesophageal reflux disease)    watches diet  . History of gout    as of 11-12-2016  per pt stable  . Hyperlipidemia   . Hypertension    per pt stopped his medication losartan on his own--- last taken 2nd week sept. 2018--- per pt checks bp and has been ok and watches diet  . Seasonal allergies   . Type 2 diabetes, diet controlled (HCC)   . Urinary retention   . Vitamin D deficiency   . Wears glasses   . Wears partial dentures    upper    Current Outpatient Medications:  .  aspirin 81 MG tablet, Take 81 mg by mouth every morning. , Disp: , Rfl:  .  atorvastatin (LIPITOR) 20 MG tablet, 1 tablet daily, Disp: 90 tablet, Rfl: 3 .  cetirizine (ZYRTEC) 10 MG tablet, Take 10 mg by mouth as needed for allergies., Disp: , Rfl:  .  Cholecalciferol (VITAMIN D3 GUMMIES ADULT) 1000 units CHEW, Chew by mouth daily.  One to two chews daily, Disp: , Rfl:  .  docusate sodium (COLACE) 100 MG capsule, Take 1 capsule (100 mg total) by mouth 2 (two) times daily., Disp: 60 capsule, Rfl: 2 .  finasteride (PROSCAR) 5 MG tablet, Take 1 tablet (5 mg total) by mouth every morning., Disp: 90 tablet, Rfl: 3 .  fish oil-omega-3 fatty acids 1000 MG capsule, Take 1 g by mouth daily. 5 days per week, Disp: , Rfl:  .  fluticasone (FLONASE) 50 MCG/ACT nasal spray, Place 2 sprays into both nostrils as needed for allergies or rhinitis., Disp: 16 g, Rfl: 6 .  Multiple Vitamins-Minerals (ZINC PO), Take 1 tablet by mouth once a week. , Disp: , Rfl:  .  naproxen (NAPROSYN) 375 MG tablet, TAKE 1 TABLET (375 MG TOTAL) BY MOUTH 2 (TWO) TIMES DAILY WITH A MEAL., Disp: 60 tablet, Rfl: 0 .  zolpidem (AMBIEN) 5 MG tablet, Take 1 tablet (5 mg total) by mouth at bedtime as needed for sleep., Disp: 30 tablet, Rfl: 5  Assessment/ Plan: 84 y.o. male   1. Tick bite of left axillary region, initial encounter Cannot rule out tickborne infection and therefore I am going to empirically treat him with doxycycline p.o. twice daily.  We discussed reasons for return evaluation and patient  voiced good understanding.  He will contact us on Friday if symptoms or not improving or contact us sooner if things are getting worse - doxycycline (VIBRA-TABS) 100 MG tablet; Take 1 tablet (100 mg total) by mouth 2 (two) times daily.  Dispense: 20 tablet; Refill: 0  2. Rash and nonspecific skin eruption - doxycycline (VIBRA-TABS) 100 MG tablet; Take 1 tablet (100 mg total) by mouth 2 (two) times daily.  Dispense: 20 tablet; Refill: 0   Start time: 5:19pm End time: 5:26pm  Total time spent on patient care (including telephone call/ virtual visit): 13 minutes  [place], [place] [phone]

## 2018-08-10 ENCOUNTER — Other Ambulatory Visit: Payer: Self-pay | Admitting: Family Medicine

## 2018-08-10 DIAGNOSIS — G8929 Other chronic pain: Secondary | ICD-10-CM

## 2018-08-10 DIAGNOSIS — M25512 Pain in left shoulder: Secondary | ICD-10-CM

## 2018-09-06 ENCOUNTER — Other Ambulatory Visit: Payer: Self-pay | Admitting: Family Medicine

## 2018-09-06 DIAGNOSIS — G8929 Other chronic pain: Secondary | ICD-10-CM

## 2018-09-19 ENCOUNTER — Other Ambulatory Visit: Payer: Self-pay | Admitting: Family Medicine

## 2018-09-19 DIAGNOSIS — E782 Mixed hyperlipidemia: Secondary | ICD-10-CM

## 2018-09-25 ENCOUNTER — Other Ambulatory Visit: Payer: Self-pay

## 2018-09-25 ENCOUNTER — Ambulatory Visit (INDEPENDENT_AMBULATORY_CARE_PROVIDER_SITE_OTHER): Payer: Medicare PPO | Admitting: Urology

## 2018-09-25 DIAGNOSIS — N401 Enlarged prostate with lower urinary tract symptoms: Secondary | ICD-10-CM | POA: Diagnosis not present

## 2018-09-25 DIAGNOSIS — N3941 Urge incontinence: Secondary | ICD-10-CM | POA: Diagnosis not present

## 2018-10-09 ENCOUNTER — Other Ambulatory Visit: Payer: Self-pay | Admitting: Family Medicine

## 2018-10-09 DIAGNOSIS — G8929 Other chronic pain: Secondary | ICD-10-CM

## 2018-10-09 DIAGNOSIS — M25512 Pain in left shoulder: Secondary | ICD-10-CM

## 2018-10-22 ENCOUNTER — Other Ambulatory Visit: Payer: Self-pay | Admitting: Family Medicine

## 2018-11-11 ENCOUNTER — Other Ambulatory Visit: Payer: Self-pay | Admitting: Family Medicine

## 2018-11-11 DIAGNOSIS — G8929 Other chronic pain: Secondary | ICD-10-CM

## 2018-12-03 ENCOUNTER — Other Ambulatory Visit: Payer: Self-pay | Admitting: Family Medicine

## 2018-12-03 DIAGNOSIS — E782 Mixed hyperlipidemia: Secondary | ICD-10-CM

## 2018-12-10 ENCOUNTER — Other Ambulatory Visit: Payer: Self-pay | Admitting: Family Medicine

## 2018-12-10 DIAGNOSIS — M25512 Pain in left shoulder: Secondary | ICD-10-CM

## 2018-12-10 DIAGNOSIS — G8929 Other chronic pain: Secondary | ICD-10-CM

## 2019-01-04 ENCOUNTER — Ambulatory Visit (INDEPENDENT_AMBULATORY_CARE_PROVIDER_SITE_OTHER): Payer: Medicare PPO | Admitting: Family Medicine

## 2019-01-04 ENCOUNTER — Encounter: Payer: Self-pay | Admitting: Family Medicine

## 2019-01-04 DIAGNOSIS — E782 Mixed hyperlipidemia: Secondary | ICD-10-CM

## 2019-01-04 DIAGNOSIS — K2100 Gastro-esophageal reflux disease with esophagitis, without bleeding: Secondary | ICD-10-CM

## 2019-01-04 DIAGNOSIS — J988 Other specified respiratory disorders: Secondary | ICD-10-CM

## 2019-01-04 DIAGNOSIS — I10 Essential (primary) hypertension: Secondary | ICD-10-CM | POA: Diagnosis not present

## 2019-01-04 DIAGNOSIS — B9789 Other viral agents as the cause of diseases classified elsewhere: Secondary | ICD-10-CM

## 2019-01-04 DIAGNOSIS — M25512 Pain in left shoulder: Secondary | ICD-10-CM

## 2019-01-04 DIAGNOSIS — G8929 Other chronic pain: Secondary | ICD-10-CM

## 2019-01-04 MED ORDER — BENZONATATE 100 MG PO CAPS: 100.0000 mg | ORAL_CAPSULE | Freq: Two times a day (BID) | ORAL | 0 refills | Status: DC | PRN

## 2019-01-04 MED ORDER — ATORVASTATIN CALCIUM 20 MG PO TABS: 20.0000 mg | ORAL_TABLET | Freq: Every day | ORAL | 3 refills | Status: DC

## 2019-01-04 MED ORDER — NAPROXEN 375 MG PO TABS: 375.0000 mg | ORAL_TABLET | Freq: Two times a day (BID) | ORAL | 3 refills | Status: DC

## 2019-01-04 NOTE — Progress Notes (Signed)
Virtual Visit via telephone Note  I connected with Terry Winters on 01/04/19 at 0925 by telephone and verified that I am speaking with the correct person using two identifiers. Terry Winters is currently located at home and no other people are currently with her during visit. The provider, Elige Radon Nyle Limb, MD is located in their office at time of visit.  Call ended at [phone]  I discussed the limitations, risks, security and privacy concerns of performing an evaluation and management service by telephone and the availability of in person appointments. I also discussed with the patient that there may be a patient responsible charge related to this service. The patient expressed understanding and agreed to proceed.   History and Present Illness: Patient has a deep cough into his chest and denies fevers or chills, he is not coughing anything up.  He denies any fevers or chills, he feels like it is a little allergies. He denies any body aches. He denies any shortness of breath.  It has been going on for 2 days.  He took a little theraflu.    Hypertension Patient is currently on no medication, and their blood pressure today is unknown. Patient denies any lightheadedness or dizziness. Patient denies headaches, blurred vision, chest pains, shortness of breath, or weakness. Denies any side effects from medication and is content with current medication.   Hyperlipidemia Patient is coming in for recheck of his hyperlipidemia. The patient is currently taking atorvastatin. They deny any issues with myalgias or history of liver damage from it. They deny any focal numbness or weakness or chest pain.   GERD Patient is currently on no medication currently.  She denies any major symptoms or abdominal pain or belching or burping. She denies any blood in her stool or lightheadedness or dizziness.    No diagnosis found.  Outpatient Encounter Medications as of 01/04/2019  Medication Sig  . aspirin 81 MG  tablet Take 81 mg by mouth every morning.   Marland Kitchen atorvastatin (LIPITOR) 20 MG tablet TAKE 1 TABLET EVERY DAY AT 6 PM  . cetirizine (ZYRTEC) 10 MG tablet Take 10 mg by mouth as needed for allergies.  . Cholecalciferol (VITAMIN D3 GUMMIES ADULT) 1000 units CHEW Chew by mouth daily. One to two chews daily  . docusate sodium (COLACE) 100 MG capsule Take 1 capsule (100 mg total) by mouth 2 (two) times daily.  Marland Kitchen doxycycline (VIBRA-TABS) 100 MG tablet Take 1 tablet (100 mg total) by mouth 2 (two) times daily.  . finasteride (PROSCAR) 5 MG tablet Take 1 tablet (5 mg total) by mouth every morning.  . fish oil-omega-3 fatty acids 1000 MG capsule Take 1 g by mouth daily. 5 days per week  . fluticasone (FLONASE) 50 MCG/ACT nasal spray Place 2 sprays into both nostrils as needed for allergies or rhinitis.  . Multiple Vitamins-Minerals (ZINC PO) Take 1 tablet by mouth once a week.   . naproxen (NAPROSYN) 375 MG tablet TAKE 1 TABLET (375 MG TOTAL) BY MOUTH 2 (TWO) TIMES DAILY WITH A MEAL.  Marland Kitchen zolpidem (AMBIEN) 5 MG tablet TAKE 1 TABLET (5 MG TOTAL) BY MOUTH AT BEDTIME AS NEEDED FOR SLEEP.   No facility-administered encounter medications on file as of 01/04/2019.     Review of Systems  Constitutional: Negative for chills and fever.  HENT: Positive for congestion, postnasal drip, rhinorrhea, sinus pressure, sneezing and sore throat. Negative for ear discharge, ear pain and voice change.   Eyes: Negative for pain, discharge, redness and visual  disturbance.  Respiratory: Positive for cough. Negative for shortness of breath and wheezing.   Cardiovascular: Negative for chest pain and leg swelling.  Musculoskeletal: Positive for arthralgias. Negative for back pain and gait problem.  Skin: Negative for rash.  All other systems reviewed and are negative.   Observations/Objective: Patient sounds comfortable and in no acute distress  Assessment and Plan: Problem List Items Addressed This Visit      Cardiovascular  and Mediastinum   HTN (hypertension) - Primary   Relevant Medications   atorvastatin (LIPITOR) 20 MG tablet     Digestive   GERD (gastroesophageal reflux disease)     Other   HLD (hyperlipidemia)   Relevant Medications   atorvastatin (LIPITOR) 20 MG tablet   Chronic left shoulder pain   Relevant Medications   naproxen (NAPROSYN) 375 MG tablet    Other Visit Diagnoses    Viral respiratory infection       Relevant Medications   benzonatate (TESSALON) 100 MG capsule       Follow Up Instructions:  Follow up in 6 months for htn and hld   I discussed the assessment and treatment plan with the patient. The patient was provided an opportunity to ask questions and all were answered. The patient agreed with the plan and demonstrated an understanding of the instructions.   The patient was advised to call back or seek an in-person evaluation if the symptoms worsen or if the condition fails to improve as anticipated.  The above assessment and management plan was discussed with the patient. The patient verbalized understanding of and has agreed to the management plan. Patient is aware to call the clinic if symptoms persist or worsen. Patient is aware when to return to the clinic for a follow-up visit. Patient educated on when it is appropriate to go to the emergency department.    I provided 11 minutes of non-face-to-face time during this encounter.    [name], MD

## 2019-01-05 ENCOUNTER — Other Ambulatory Visit: Payer: Self-pay

## 2019-01-05 DIAGNOSIS — Z20822 Contact with and (suspected) exposure to covid-19: Secondary | ICD-10-CM

## 2019-01-07 LAB — NOVEL CORONAVIRUS, NAA: SARS-CoV-2, NAA: DETECTED — AB

## 2019-02-08 ENCOUNTER — Other Ambulatory Visit: Payer: Self-pay | Admitting: Family Medicine

## 2019-02-08 DIAGNOSIS — E782 Mixed hyperlipidemia: Secondary | ICD-10-CM

## 2019-03-09 ENCOUNTER — Other Ambulatory Visit: Payer: Self-pay | Admitting: Family Medicine

## 2019-04-05 ENCOUNTER — Telehealth: Payer: Self-pay | Admitting: Family Medicine

## 2019-04-05 NOTE — Telephone Encounter (Signed)
appt made for pcp OV for rf

## 2019-04-05 NOTE — Telephone Encounter (Signed)
What is the name of the medication? zolpidem (AMBIEN) 5 MG tablet    Have you contacted your pharmacy to request a refill? yes  Which pharmacy would you like this sent to? [facility] last appt with Dr. Algis Downs was on 01/04/2019 was told to f/u within 6 months he is completely out   Patient notified that their request is being sent to the clinical staff for review and that they should receive a call once it is complete. If they do not receive a call within 24 hours they can check with their pharmacy or our office.

## 2019-04-12 ENCOUNTER — Other Ambulatory Visit: Payer: Self-pay

## 2019-04-13 ENCOUNTER — Ambulatory Visit (INDEPENDENT_AMBULATORY_CARE_PROVIDER_SITE_OTHER): Payer: Medicare PPO | Admitting: Family Medicine

## 2019-04-13 ENCOUNTER — Encounter: Payer: Self-pay | Admitting: Family Medicine

## 2019-04-13 VITALS — BP 146/74 | HR 89 | Temp 99.3°F | Ht 67.0 in | Wt 194.0 lb

## 2019-04-13 DIAGNOSIS — I10 Essential (primary) hypertension: Secondary | ICD-10-CM | POA: Diagnosis not present

## 2019-04-13 DIAGNOSIS — E782 Mixed hyperlipidemia: Secondary | ICD-10-CM | POA: Diagnosis not present

## 2019-04-13 DIAGNOSIS — G4700 Insomnia, unspecified: Secondary | ICD-10-CM

## 2019-04-13 MED ORDER — ZOLPIDEM TARTRATE 5 MG PO TABS: 5.0000 mg | ORAL_TABLET | Freq: Every evening | ORAL | 5 refills | Status: DC | PRN

## 2019-04-13 NOTE — Progress Notes (Signed)
BP (!) 146/74   Pulse 89   Temp 99.3 F (37.4 C)   Ht '5\' 7"'  (1.702 m)   Wt 194 lb (88 kg)   SpO2 96%   BMI 30.38 kg/m    Subjective:   Patient ID: Terry Winters, male    DOB: [birth year], 85 y.o.   MRN: [id number]  HPI: Terry Winters is a 85 y.o. male presenting on 04/13/2019 for Medication Refill (Refill Ambien. Sleeps 4-5 hours per night)   HPI Insomnia Current rx-Ambien 5 mg nightly as needed # meds rx-30 Effectiveness of current meds-works well, only takes 1/2 tablet most of the time. Adverse reactions form meds-none  Pill count performed-No Last drug screen -N/A ( high risk q67m moderate risk q637mlow risk yearly ) Urine drug screen today- Yes [place]  If yes were their any concerning findings? -None  No flowsheet data found.   Controlled substance contract signed on: Today  Hypertension Patient is currently on no medication, and their blood pressure today is 146/74. Patient denies any lightheadedness or dizziness. Patient denies headaches, blurred vision, chest pains, shortness of breath, or weakness. Denies any side effects from medication and is content with current medication.   Hyperlipidemia Patient is coming in for recheck of his hyperlipidemia. The patient is currently taking atorvastatin. They deny any issues with myalgias or history of liver damage from it. They deny any focal numbness or weakness or chest pain.   Relevant past medical, surgical, family and social history reviewed and updated as indicated. Interim medical history since our last visit reviewed. Allergies and medications reviewed and updated.  Review of Systems  Constitutional: Negative for chills and fever.  Eyes: Negative for visual disturbance.  Respiratory: Negative for shortness of breath and wheezing.   Cardiovascular: Negative for chest pain and leg swelling.  Musculoskeletal: Negative for back pain and gait problem.  Skin: Negative for rash.  Neurological:  Negative for dizziness, weakness and light-headedness.  All other systems reviewed and are negative.   Per HPI unless specifically indicated above   Allergies as of 04/13/2019      Reactions   Amoxicillin Rash   Prednisone Rash   Sulfa Antibiotics Other (See Comments)   "upset stomach"   Triamcinolone Rash      Medication List       Accurate as of April 13, 2019  2:54 PM. If you have any questions, ask your nurse or doctor.        STOP taking these medications   benzonatate 100 MG capsule Commonly known as: TESSALON Stopped by: JoFransisca Kaufmannettinger, MD   docusate sodium 100 MG capsule Commonly known as: Colace Stopped by: JoFransisca Kaufmannettinger, MD     TAKE these medications   aspirin 81 MG tablet Take 81 mg by mouth every morning.   atorvastatin 20 MG tablet Commonly known as: LIPITOR TAKE 1 TABLET EVERY DAY AT 6 PM   cetirizine 10 MG tablet Commonly known as: ZYRTEC Take 10 mg by mouth as needed for allergies.   finasteride 5 MG tablet Commonly known as: PROSCAR Take 1 tablet (5 mg total) by mouth every morning.   fish oil-omega-3 fatty acids 1000 MG capsule Take 1 g by mouth daily. 5 days per week   fluticasone 50 MCG/ACT nasal spray Commonly known as: FLONASE Place 2 sprays into both nostrils as needed for allergies or rhinitis.   naproxen 375 MG tablet Commonly known as: NAPROSYN Take 1 tablet (375 mg total) by  mouth 2 (two) times daily with a meal.   Vitamin D3 Gummies Adult 25 MCG (1000 UT) Chew Generic drug: Cholecalciferol Chew by mouth daily. One to two chews daily   ZINC PO Take 1 tablet by mouth once a week.   zolpidem 5 MG tablet Commonly known as: AMBIEN Take 1 tablet (5 mg total) by mouth at bedtime as needed for sleep.        Objective:   BP (!) 146/74   Pulse 89   Temp 99.3 F (37.4 C)   Ht '5\' 7"'  (1.702 m)   Wt 194 lb (88 kg)   SpO2 96%   BMI 30.38 kg/m   Wt Readings from Last 3 Encounters:  04/13/19 194 lb (88 kg)   07/02/18 189 lb 6.4 oz (85.9 kg)  04/03/18 190 lb 6.4 oz (86.4 kg)    Physical Exam Vitals and nursing note reviewed.  Constitutional:      General: He is not in acute distress.    Appearance: He is well-developed. He is not diaphoretic.  Eyes:     General: No scleral icterus.    Conjunctiva/sclera: Conjunctivae normal.  Neck:     Thyroid: No thyromegaly.  Cardiovascular:     Rate and Rhythm: Normal rate and regular rhythm.     Heart sounds: Normal heart sounds. No murmur.  Pulmonary:     Effort: Pulmonary effort is normal. No respiratory distress.     Breath sounds: Normal breath sounds. No wheezing.  Musculoskeletal:        General: Normal range of motion.     Cervical back: Neck supple.  Lymphadenopathy:     Cervical: No cervical adenopathy.  Skin:    General: Skin is warm and dry.     Findings: No rash.  Neurological:     Mental Status: He is alert and oriented to person, place, and time.     Coordination: Coordination normal.  Psychiatric:        Behavior: Behavior normal.       Assessment & Plan:   Problem List Items Addressed This Visit      Cardiovascular and Mediastinum   HTN (hypertension) - Primary   Relevant Orders   CBC with Differential/Platelet   CMP14+EGFR     Other   HLD (hyperlipidemia)   Relevant Orders   CBC with Differential/Platelet   Lipid panel    Other Visit Diagnoses    Insomnia, unspecified type       Relevant Medications   zolpidem (AMBIEN) 5 MG tablet   Other Relevant Orders   CBC with Differential/Platelet   ToxASSURE Select 13 (MW), Urine      Continue Ambien, will do tox assure and blood work, see back in 6 months. Blood pressure slightly elevated, patient will keep a close eye on it at home. Follow up plan: Return in about 6 months (around 10/14/2019), or if symptoms worsen or fail to improve, for Hypertension and cholesterol and insomnia.  Counseling provided for all of the vaccine components Orders Placed This  Encounter  Procedures  . CBC with Differential/Platelet  . CMP14+EGFR  . Lipid panel  . ToxASSURE Select 13 (MW), Urine    Caryl Pina, MD [facility] 04/13/2019, 2:54 PM

## 2019-04-14 LAB — CBC WITH DIFFERENTIAL/PLATELET
Basophils Absolute: 0.1 10*3/uL (ref 0.0–0.2)
Basos: 0 %
EOS (ABSOLUTE): 0.4 10*3/uL (ref 0.0–0.4)
Eos: 3 %
Hematocrit: 47.9 % (ref 37.5–51.0)
Hemoglobin: 16.5 g/dL (ref 13.0–17.7)
Immature Grans (Abs): 0.1 10*3/uL (ref 0.0–0.1)
Immature Granulocytes: 1 %
Lymphocytes Absolute: 3.3 10*3/uL — ABNORMAL HIGH (ref 0.7–3.1)
Lymphs: 26 %
MCH: 29.7 pg (ref 26.6–33.0)
MCHC: 34.4 g/dL (ref 31.5–35.7)
MCV: 86 fL (ref 79–97)
Monocytes Absolute: 0.9 10*3/uL (ref 0.1–0.9)
Monocytes: 7 %
Neutrophils Absolute: 8 10*3/uL — ABNORMAL HIGH (ref 1.4–7.0)
Neutrophils: 63 %
Platelets: 245 10*3/uL (ref 150–450)
RBC: 5.56 x10E6/uL (ref 4.14–5.80)
RDW: 13.3 % (ref 11.6–15.4)
WBC: 12.7 10*3/uL — ABNORMAL HIGH (ref 3.4–10.8)

## 2019-04-14 LAB — CMP14+EGFR
ALT: 25 IU/L (ref 0–44)
AST: 34 IU/L (ref 0–40)
Albumin/Globulin Ratio: 1.8 (ref 1.2–2.2)
Albumin: 4.4 g/dL (ref 3.6–4.6)
Alkaline Phosphatase: 98 IU/L (ref 39–117)
BUN/Creatinine Ratio: 16 (ref 10–24)
BUN: 17 mg/dL (ref 8–27)
Bilirubin Total: 0.4 mg/dL (ref 0.0–1.2)
CO2: 23 mmol/L (ref 20–29)
Calcium: 9.4 mg/dL (ref 8.6–10.2)
Chloride: 101 mmol/L (ref 96–106)
Creatinine, Ser: 1.07 mg/dL (ref 0.76–1.27)
GFR calc Af Amer: 74 mL/min/{1.73_m2} (ref >59)
GFR calc non Af Amer: 64 mL/min/{1.73_m2} (ref >59)
Globulin, Total: 2.4 g/dL (ref 1.5–4.5)
Glucose: 87 mg/dL (ref 65–99)
Potassium: 4.3 mmol/L (ref 3.5–5.2)
Sodium: 138 mmol/L (ref 134–144)
Total Protein: 6.8 g/dL (ref 6.0–8.5)

## 2019-04-14 LAB — LIPID PANEL
Chol/HDL Ratio: 3.6 ratio (ref 0.0–5.0)
Cholesterol, Total: 142 mg/dL (ref 100–199)
HDL: 39 mg/dL — ABNORMAL LOW (ref >39)
LDL Chol Calc (NIH): 69 mg/dL (ref 0–99)
Triglycerides: 208 mg/dL — ABNORMAL HIGH (ref 0–149)
VLDL Cholesterol Cal: 34 mg/dL (ref 5–40)

## 2019-04-16 LAB — TOXASSURE SELECT 13 (MW), URINE

## 2019-05-15 ENCOUNTER — Other Ambulatory Visit: Payer: Self-pay | Admitting: Family Medicine

## 2019-05-15 DIAGNOSIS — G8929 Other chronic pain: Secondary | ICD-10-CM

## 2019-06-29 ENCOUNTER — Encounter: Payer: Self-pay | Admitting: Family Medicine

## 2019-06-29 ENCOUNTER — Ambulatory Visit: Payer: Medicare PPO | Admitting: Family Medicine

## 2019-06-29 ENCOUNTER — Other Ambulatory Visit: Payer: Self-pay

## 2019-06-29 VITALS — BP 136/82 | HR 79 | Temp 99.3°F | Ht 67.0 in | Wt 196.2 lb

## 2019-06-29 DIAGNOSIS — L03113 Cellulitis of right upper limb: Secondary | ICD-10-CM

## 2019-06-29 MED ORDER — CIPROFLOXACIN HCL 500 MG PO TABS: 500.0000 mg | ORAL_TABLET | Freq: Two times a day (BID) | ORAL | 0 refills | Status: DC

## 2019-06-29 NOTE — Addendum Note (Signed)
Addended by: Caryl Bis on: 06/29/2019 03:13 PM   Modules accepted: Orders

## 2019-06-29 NOTE — Progress Notes (Signed)
Chief Complaint  Patient presents with  . Recurrent Skin Infections    Right arm below elbow, 1 week    HPI  Patient presents today for redness and swelling on the right forearm just distal to the elbow.  It is on the dorsal surface.  There is no known injury..  Just started getting sore last week and then more redness spread from it.  He put some Neosporin on it.  It seems swollen as well.  The denies fever, no chills no sweats he is concerned that it is a boil.  It has not drained at all.  PMH: Smoking status noted ROS: Per HPI  Objective:  BP 136/82   Pulse 79   Temp 99.3 F (37.4 C) (Temporal)   Ht 5\' 7"  (1.702 m)   Wt 196 lb 3.2 oz (89 kg)   BMI 30.73 kg/m  Gen: NAD, alert, cooperative with exam HEENT: NCAT, EOMI, PERRL Ext: No edema, warm.  5 cm of erythema surrounding the rounding a central pustule.  Palpation of the region led to pus being exuded from the pustule.  Approximately 2 mL.  This was cultured.  No further fluctuance noted.  Wound dressing applied and antibiotics prescribed the right upper extremity is neurovascularly intact. Neuro: Alert and oriented, No gross deficits    Assessment and plan:  1. Cellulitis of right upper limb     Meds ordered this encounter  Medications  . ciprofloxacin (CIPRO) 500 MG tablet    Sig: Take 1 tablet (500 mg total) by mouth 2 (two) times daily.    Dispense:  20 tablet    Refill:  0    Follow up in 3 days for wound check  , MD cellulitis and abscess cellulitis

## 2019-07-02 ENCOUNTER — Encounter: Payer: Self-pay | Admitting: Family Medicine

## 2019-07-02 ENCOUNTER — Other Ambulatory Visit: Payer: Self-pay

## 2019-07-02 ENCOUNTER — Ambulatory Visit: Payer: Medicare PPO | Admitting: Family Medicine

## 2019-07-02 VITALS — BP 154/70 | HR 86 | Temp 98.3°F | Resp 20 | Ht 67.0 in | Wt 194.0 lb

## 2019-07-02 DIAGNOSIS — L03113 Cellulitis of right upper limb: Secondary | ICD-10-CM | POA: Diagnosis not present

## 2019-07-02 MED ORDER — MINOCYCLINE HCL 100 MG PO CAPS: 100.0000 mg | ORAL_CAPSULE | Freq: Two times a day (BID) | ORAL | 0 refills | Status: DC

## 2019-07-02 NOTE — Progress Notes (Signed)
Chief Complaint  Patient presents with  . Wound Check    HPI  Patient presents today for recheck of his cellulitis.  This was viewed 3 days ago and he was started on antibiotic.  He said it is less uncomfortable and sore today.  But it has not resolved.  Culture has shown that it is Staph aureus.  Sensitivities are pending.  PMH: Smoking status noted ROS: Per HPI  Objective: BP (!) 154/70   Pulse 86   Temp 98.3 F (36.8 C) (Temporal)   Resp 20   Ht 5\' 7"  (1.702 m)   Wt 194 lb (88 kg)   SpO2 94%   BMI 30.38 kg/m  Gen: NAD, alert, cooperative with exam HEENT: NCAT, EOMI, PERRL CV: RRR, good S1/S2, no murmur Ext: No edema, warm.  Moderate erythema noted at the site of the cellulitis on the right arm just distal to the olecranon.  The erythema has faded since first valuation earlier this week.  There is an open draining nidus with minimal purulence this was palpated.  And 2 to 3 drops of purulent matter obtained. Neuro: Alert and oriented, No gross deficits  Assessment and plan:  No diagnosis found.  Meds ordered this encounter  Medications  . minocycline (MINOCIN) 100 MG capsule    Sig: Take 1 capsule (100 mg total) by mouth 2 (two) times daily. Take on an empty stomach    Dispense:  20 capsule    Refill:  0    Patient should discontinue the Cipro.  Minocycline should work better for MRSA in case the culture sensitivities come back showing that.  Follow up as needed.  Signs of worsening infection were reviewed with the patient.  , MD

## 2019-07-04 ENCOUNTER — Encounter: Payer: Self-pay | Admitting: Family Medicine

## 2019-07-04 LAB — ANAEROBIC AND AEROBIC CULTURE

## 2019-07-06 ENCOUNTER — Other Ambulatory Visit: Payer: Self-pay | Admitting: Family Medicine

## 2019-09-17 ENCOUNTER — Other Ambulatory Visit: Payer: Self-pay | Admitting: Family Medicine

## 2019-09-17 DIAGNOSIS — M25512 Pain in left shoulder: Secondary | ICD-10-CM

## 2019-11-03 ENCOUNTER — Other Ambulatory Visit: Payer: Self-pay | Admitting: Family Medicine

## 2019-11-03 DIAGNOSIS — E782 Mixed hyperlipidemia: Secondary | ICD-10-CM

## 2019-11-04 NOTE — Telephone Encounter (Signed)
Dettinger NTBS for 6 mos ckup mail order sent

## 2019-12-08 ENCOUNTER — Other Ambulatory Visit: Payer: Self-pay | Admitting: Family Medicine

## 2019-12-08 DIAGNOSIS — G4700 Insomnia, unspecified: Secondary | ICD-10-CM

## 2019-12-10 ENCOUNTER — Other Ambulatory Visit: Payer: Self-pay | Admitting: Family Medicine

## 2019-12-10 DIAGNOSIS — G4700 Insomnia, unspecified: Secondary | ICD-10-CM

## 2019-12-10 NOTE — Telephone Encounter (Signed)
Patient aware.

## 2019-12-10 NOTE — Telephone Encounter (Signed)
rc for [employer]

## 2019-12-21 ENCOUNTER — Encounter: Payer: Self-pay | Admitting: Family Medicine

## 2019-12-21 ENCOUNTER — Ambulatory Visit (INDEPENDENT_AMBULATORY_CARE_PROVIDER_SITE_OTHER): Payer: Medicare PPO | Admitting: Family Medicine

## 2019-12-21 ENCOUNTER — Other Ambulatory Visit: Payer: Self-pay

## 2019-12-21 VITALS — BP 155/69 | HR 79 | Temp 97.0°F | Ht 68.0 in | Wt 189.0 lb

## 2019-12-21 DIAGNOSIS — E782 Mixed hyperlipidemia: Secondary | ICD-10-CM

## 2019-12-21 DIAGNOSIS — R3912 Poor urinary stream: Secondary | ICD-10-CM

## 2019-12-21 DIAGNOSIS — N401 Enlarged prostate with lower urinary tract symptoms: Secondary | ICD-10-CM | POA: Diagnosis not present

## 2019-12-21 DIAGNOSIS — Z23 Encounter for immunization: Secondary | ICD-10-CM

## 2019-12-21 DIAGNOSIS — G4700 Insomnia, unspecified: Secondary | ICD-10-CM

## 2019-12-21 DIAGNOSIS — I10 Essential (primary) hypertension: Secondary | ICD-10-CM

## 2019-12-21 LAB — CMP14+EGFR
ALT: 22 IU/L (ref 0–44)
AST: 17 IU/L (ref 0–40)
Albumin/Globulin Ratio: 1.8 (ref 1.2–2.2)
Albumin: 4.2 g/dL (ref 3.6–4.6)
Alkaline Phosphatase: 99 IU/L (ref 44–121)
BUN/Creatinine Ratio: 13 (ref 10–24)
BUN: 13 mg/dL (ref 8–27)
Bilirubin Total: 0.5 mg/dL (ref 0.0–1.2)
CO2: 23 mmol/L (ref 20–29)
Calcium: 9.3 mg/dL (ref 8.6–10.2)
Chloride: 99 mmol/L (ref 96–106)
Creatinine, Ser: 0.98 mg/dL (ref 0.76–1.27)
GFR calc Af Amer: 82 mL/min/{1.73_m2} (ref >59)
GFR calc non Af Amer: 71 mL/min/{1.73_m2} (ref >59)
Globulin, Total: 2.4 g/dL (ref 1.5–4.5)
Glucose: 131 mg/dL — ABNORMAL HIGH (ref 65–99)
Potassium: 4.2 mmol/L (ref 3.5–5.2)
Sodium: 136 mmol/L (ref 134–144)
Total Protein: 6.6 g/dL (ref 6.0–8.5)

## 2019-12-21 LAB — CBC WITH DIFFERENTIAL/PLATELET
Basophils Absolute: 0.1 10*3/uL (ref 0.0–0.2)
Basos: 1 %
EOS (ABSOLUTE): 0.2 10*3/uL (ref 0.0–0.4)
Eos: 2 %
Hematocrit: 48.9 % (ref 37.5–51.0)
Hemoglobin: 16.4 g/dL (ref 13.0–17.7)
Immature Grans (Abs): 0.1 10*3/uL (ref 0.0–0.1)
Immature Granulocytes: 1 %
Lymphocytes Absolute: 3 10*3/uL (ref 0.7–3.1)
Lymphs: 27 %
MCH: 28.5 pg (ref 26.6–33.0)
MCHC: 33.5 g/dL (ref 31.5–35.7)
MCV: 85 fL (ref 79–97)
Monocytes Absolute: 0.8 10*3/uL (ref 0.1–0.9)
Monocytes: 7 %
Neutrophils Absolute: 6.9 10*3/uL (ref 1.4–7.0)
Neutrophils: 62 %
Platelets: 246 10*3/uL (ref 150–450)
RBC: 5.75 x10E6/uL (ref 4.14–5.80)
RDW: 12.6 % (ref 11.6–15.4)
WBC: 11.1 10*3/uL — ABNORMAL HIGH (ref 3.4–10.8)

## 2019-12-21 LAB — LIPID PANEL
Chol/HDL Ratio: 3.1 ratio (ref 0.0–5.0)
Cholesterol, Total: 148 mg/dL (ref 100–199)
HDL: 47 mg/dL (ref >39)
LDL Chol Calc (NIH): 76 mg/dL (ref 0–99)
Triglycerides: 147 mg/dL (ref 0–149)
VLDL Cholesterol Cal: 25 mg/dL (ref 5–40)

## 2019-12-21 MED ORDER — ZOLPIDEM TARTRATE 5 MG PO TABS: 5.0000 mg | ORAL_TABLET | Freq: Every evening | ORAL | 5 refills | Status: DC | PRN

## 2019-12-21 MED ORDER — FINASTERIDE 5 MG PO TABS: 5.0000 mg | ORAL_TABLET | Freq: Every morning | ORAL | 3 refills | Status: DC

## 2019-12-21 MED ORDER — ATORVASTATIN CALCIUM 20 MG PO TABS: 20.0000 mg | ORAL_TABLET | Freq: Every day | ORAL | 3 refills | Status: DC

## 2019-12-21 NOTE — Progress Notes (Signed)
BP (!) 155/69   Pulse 79   Temp (!) 97 F (36.1 C)   Ht 5' 8" (1.727 m)   Wt 189 lb (85.7 kg)   SpO2 95%   BMI 28.74 kg/m    Subjective:   Patient ID: Terry Winters, male    DOB: 19-Dec-1934, 85 y.o.   MRN: [id number]  HPI: Terry Winters is a 85 y.o. male presenting on 12/21/2019 for Medical Management of Chronic Issues and Insomnia   HPI Hypertension Patient is currently on no medication and monitoring, and their blood pressure today is 155/69. Patient denies any lightheadedness or dizziness. Patient denies headaches, blurred vision, chest pains, shortness of breath, or weakness. Denies any side effects from medication and is content with current medication.   Hyperlipidemia Patient is coming in for recheck of his hyperlipidemia. The patient is currently taking fish oil and Lipitor. They deny any issues with myalgias or history of liver damage from it. They deny any focal numbness or weakness or chest pain.   BPH Patient is coming in for recheck on BPH Symptoms: Urinary urgency Medication: Finasteride Last PSA: Sees Dr. Jeffie Pollock, has 1 every year  Insomnia Current rx-Ambien 5 mg nightly as needed # meds rx-30 Effectiveness of current meds-works well Adverse reactions form meds-none  Pill count performed-No Last drug screen -04/27/2019 ( high risk q12m moderate risk q651mlow risk yearly ) Urine drug screen today- No [place]  If yes were their any concerning findings? -None  No flowsheet data found.   Controlled substance contract signed on: 04/27/2019  Relevant past medical, surgical, family and social history reviewed and updated as indicated. Interim medical history since our last visit reviewed. Allergies and medications reviewed and updated.  Review of Systems  Constitutional: Negative for chills and fever.  Respiratory: Negative for shortness of breath and wheezing.   Cardiovascular: Negative for chest pain and leg swelling.  Musculoskeletal:  Negative for back pain and gait problem.  Skin: Negative for rash.  Neurological: Negative for dizziness, weakness, light-headedness and numbness.  All other systems reviewed and are negative.   Per HPI unless specifically indicated above   Allergies as of 12/21/2019      Reactions   Amoxicillin Rash   Prednisone Rash   Sulfa Antibiotics Other (See Comments)   "upset stomach"   Triamcinolone Rash      Medication List       Accurate as of December 21, 2019  9:18 AM. If you have any questions, ask your nurse or doctor.        STOP taking these medications   cetirizine 10 MG tablet Commonly known as: ZYRTEC Stopped by: JoWorthy RancherMD   Vitamin D3 Gummies Adult 25 MCG (1000 UT) Chew Generic drug: Cholecalciferol Stopped by: JoFransisca Kaufmannettinger, MD     TAKE these medications   aspirin 81 MG tablet Take 81 mg by mouth every morning.   atorvastatin 20 MG tablet Commonly known as: LIPITOR TAKE 1 TABLET EVERY DAY AT 6 PM   finasteride 5 MG tablet Commonly known as: PROSCAR Take 1 tablet (5 mg total) by mouth every morning.   fish oil-omega-3 fatty acids 1000 MG capsule Take 1 g by mouth daily. 5 days per week   fluticasone 50 MCG/ACT nasal spray Commonly known as: FLONASE PLACE 2 SPRAYS INTO BOTH NOSTRILS AS NEEDED FOR ALLERGIES OR RHINITIS.   minocycline 100 MG capsule Commonly known as: Minocin Take 1 capsule (100 mg total) by mouth  2 (two) times daily. Take on an empty stomach   naproxen 375 MG tablet Commonly known as: NAPROSYN TAKE 1 TABLET (375 MG TOTAL) BY MOUTH 2 (TWO) TIMES DAILY WITH A MEAL.   ZINC PO Take 1 tablet by mouth once a week.   zolpidem 5 MG tablet Commonly known as: AMBIEN TAKE 1 TABLET (5 MG TOTAL) BY MOUTH AT BEDTIME AS NEEDED FOR SLEEP.        Objective:   BP (!) 155/69   Pulse 79   Temp (!) 97 F (36.1 C)   Ht 5' 8" (1.727 m)   Wt 189 lb (85.7 kg)   SpO2 95%   BMI 28.74 kg/m   Wt Readings from Last 3 Encounters:   12/21/19 189 lb (85.7 kg)  07/02/19 194 lb (88 kg)  06/29/19 196 lb 3.2 oz (89 kg)    Physical Exam Vitals and nursing note reviewed.  Constitutional:      General: He is not in acute distress.    Appearance: He is well-developed. He is not diaphoretic.  Eyes:     General: No scleral icterus.    Conjunctiva/sclera: Conjunctivae normal.  Neck:     Thyroid: No thyromegaly.  Cardiovascular:     Rate and Rhythm: Normal rate and regular rhythm.     Heart sounds: Normal heart sounds. No murmur heard.   Pulmonary:     Effort: Pulmonary effort is normal. No respiratory distress.     Breath sounds: Normal breath sounds. No wheezing.  Musculoskeletal:        General: Normal range of motion.     Cervical back: Neck supple.  Lymphadenopathy:     Cervical: No cervical adenopathy.  Skin:    General: Skin is warm and dry.     Findings: No rash.  Neurological:     Mental Status: He is alert and oriented to person, place, and time.     Coordination: Coordination normal.  Psychiatric:        Behavior: Behavior normal.       Assessment & Plan:   Problem List Items Addressed This Visit      Cardiovascular and Mediastinum   HTN (hypertension) - Primary   Relevant Medications   atorvastatin (LIPITOR) 20 MG tablet   Other Relevant Orders   CBC with Differential/Platelet   CMP14+EGFR     Genitourinary   BPH (benign prostatic hyperplasia)   Relevant Medications   finasteride (PROSCAR) 5 MG tablet     Other   HLD (hyperlipidemia)   Relevant Medications   atorvastatin (LIPITOR) 20 MG tablet   Other Relevant Orders   CMP14+EGFR   Lipid panel    Other Visit Diagnoses    Insomnia, unspecified type       Relevant Medications   zolpidem (AMBIEN) 5 MG tablet   Other Relevant Orders   CMP14+EGFR      Does not use Ambien every day but uses it sometimes, sent refills for him.  He seems to be doing well with it.  Will check blood work today, continue other medication, no  changes. Follow up plan: Return in about 6 months (around 06/19/2020), or if symptoms worsen or fail to improve, for Hypertension and hyperlipidemia and insomnia.  Counseling provided for all of the vaccine components Orders Placed This Encounter  Procedures  . CBC with Differential/Platelet  . CMP14+EGFR  . Lipid panel    Caryl Pina, MD [facility] 12/21/2019, 9:18 AM

## 2019-12-21 NOTE — Addendum Note (Signed)
Addended by: Dorene Sorrow on: 12/21/2019 10:13 AM   Modules accepted: Orders

## 2019-12-23 LAB — SPECIMEN STATUS REPORT

## 2019-12-23 LAB — HGB A1C W/O EAG: Hgb A1c MFr Bld: 6.2 % — ABNORMAL HIGH (ref 4.8–5.6)

## 2019-12-24 ENCOUNTER — Telehealth: Payer: Self-pay

## 2019-12-24 NOTE — Telephone Encounter (Signed)
Pt calling to let Dettinger know BP reading 12/24/2019--147/74 at 9:55am He wants to know if Dettinger wants him to call his bp reading everyday.

## 2019-12-26 NOTE — Telephone Encounter (Signed)
Just have him call me his blood pressure readings 1 week at a time, continue to check it at different times every day

## 2019-12-27 NOTE — Telephone Encounter (Signed)
Patient aware and verbalizes understanding. 

## 2020-01-19 ENCOUNTER — Other Ambulatory Visit: Payer: Self-pay | Admitting: Family Medicine

## 2020-01-19 DIAGNOSIS — M25512 Pain in left shoulder: Secondary | ICD-10-CM

## 2020-01-19 DIAGNOSIS — G8929 Other chronic pain: Secondary | ICD-10-CM

## 2020-01-25 ENCOUNTER — Telehealth: Payer: Self-pay | Admitting: Family Medicine

## 2020-01-25 NOTE — Telephone Encounter (Signed)
Pt wanted to let Dr Dettinger know that his BP this morning was 163/75

## 2020-01-26 ENCOUNTER — Other Ambulatory Visit: Payer: Self-pay | Admitting: Family Medicine

## 2020-01-26 MED ORDER — AMLODIPINE BESYLATE 2.5 MG PO TABS: 2.5000 mg | ORAL_TABLET | Freq: Every day | ORAL | 3 refills | Status: DC

## 2020-01-26 NOTE — Progress Notes (Signed)
Please let her know that I sent in amlodipine 2.5 mg to help with blood pressure and have him schedule an appointment and a month to be seen for blood pressure check to see if the medication is working and bring his machine with him and bring his written numbers with him Arville Care, MD [facility] 01/26/2020, 1:24 PM

## 2020-01-26 NOTE — Progress Notes (Signed)
Attempted to contact patient with no answer.

## 2020-02-16 ENCOUNTER — Other Ambulatory Visit: Payer: Self-pay | Admitting: Family Medicine

## 2020-02-16 DIAGNOSIS — E782 Mixed hyperlipidemia: Secondary | ICD-10-CM

## 2020-03-22 ENCOUNTER — Telehealth: Payer: Self-pay

## 2020-03-22 NOTE — Telephone Encounter (Signed)
Have him continue to monitor it taking the amlodipine consistently and see how it does over the next couple weeks.

## 2020-03-22 NOTE — Telephone Encounter (Signed)
Patient has been taking his blood pressure medication, Amlodipine, regularly.  He has not been taking his blood pressure reading regularly but did this morning and it was 157/79 after he had taken Amlodipine several hours earlier.  He is concerned and wants to know what you recommend.

## 2020-03-22 NOTE — Telephone Encounter (Signed)
Patients wife aware

## 2020-05-18 DIAGNOSIS — Z23 Encounter for immunization: Secondary | ICD-10-CM | POA: Diagnosis not present

## 2020-05-19 ENCOUNTER — Ambulatory Visit: Payer: Medicare PPO | Admitting: Family Medicine

## 2020-05-19 ENCOUNTER — Telehealth: Payer: Self-pay | Admitting: *Deleted

## 2020-05-19 NOTE — Telephone Encounter (Signed)
Wife called in a few minutes ago for pt to be seen today. Appt was made for 4pm Pt fell and skinned his nose and has a cut on his forehead above his eyebrow, not sure how deep it is or if it needs stitches. Advised patient to go to the ED since he did hit his head and a CT scan is recommended. Wife if agreeable with recommendation, cancelled appt here with provider

## 2020-05-27 ENCOUNTER — Other Ambulatory Visit: Payer: Self-pay | Admitting: Family Medicine

## 2020-05-27 DIAGNOSIS — G8929 Other chronic pain: Secondary | ICD-10-CM

## 2020-06-25 ENCOUNTER — Other Ambulatory Visit: Payer: Self-pay | Admitting: Family Medicine

## 2020-06-25 DIAGNOSIS — M25512 Pain in left shoulder: Secondary | ICD-10-CM

## 2020-06-25 DIAGNOSIS — G8929 Other chronic pain: Secondary | ICD-10-CM

## 2020-06-26 NOTE — Telephone Encounter (Signed)
Dettinger NTBS 30 days given 05/29/20 

## 2020-07-04 ENCOUNTER — Other Ambulatory Visit: Payer: Self-pay | Admitting: Family Medicine

## 2020-07-04 DIAGNOSIS — M25512 Pain in left shoulder: Secondary | ICD-10-CM

## 2020-07-04 DIAGNOSIS — G8929 Other chronic pain: Secondary | ICD-10-CM

## 2020-07-05 ENCOUNTER — Other Ambulatory Visit: Payer: Self-pay | Admitting: Family Medicine

## 2020-07-05 DIAGNOSIS — G8929 Other chronic pain: Secondary | ICD-10-CM

## 2020-07-12 ENCOUNTER — Encounter: Payer: Self-pay | Admitting: Family Medicine

## 2020-07-12 ENCOUNTER — Other Ambulatory Visit: Payer: Self-pay

## 2020-07-12 ENCOUNTER — Ambulatory Visit: Payer: Medicare PPO | Admitting: Family Medicine

## 2020-07-12 VITALS — BP 111/64 | HR 73 | Temp 97.9°F | Ht 68.0 in | Wt 190.8 lb

## 2020-07-12 DIAGNOSIS — I10 Essential (primary) hypertension: Secondary | ICD-10-CM

## 2020-07-12 DIAGNOSIS — G4700 Insomnia, unspecified: Secondary | ICD-10-CM

## 2020-07-12 DIAGNOSIS — Z79899 Other long term (current) drug therapy: Secondary | ICD-10-CM | POA: Diagnosis not present

## 2020-07-12 DIAGNOSIS — E782 Mixed hyperlipidemia: Secondary | ICD-10-CM

## 2020-07-12 DIAGNOSIS — R7303 Prediabetes: Secondary | ICD-10-CM

## 2020-07-12 LAB — BAYER DCA HB A1C WAIVED: HB A1C (BAYER DCA - WAIVED): 5.9 % (ref <7.0)

## 2020-07-12 MED ORDER — ZOLPIDEM TARTRATE 5 MG PO TABS: 5.0000 mg | ORAL_TABLET | Freq: Every evening | ORAL | 5 refills | Status: DC | PRN

## 2020-07-12 MED ORDER — ATORVASTATIN CALCIUM 20 MG PO TABS: 20.0000 mg | ORAL_TABLET | Freq: Every evening | ORAL | 3 refills | Status: DC | PRN

## 2020-07-12 NOTE — Progress Notes (Signed)
BP 111/64   Pulse 73   Temp 97.9 F (36.6 C)   Ht '5\' 8"'  (1.727 m)   Wt 190 lb 12.8 oz (86.5 kg)   SpO2 96%   BMI 29.01 kg/m    Subjective:   Patient ID: Terry Winters, male    DOB: Mar 06, 1934, 86 y.o.   MRN: [id number]  HPI: Lyncoln Ledgerwood is a 86 y.o. male presenting on 07/12/2020 for Medication Refill   HPI Prediabetes Patient comes in today for recheck of his diabetes. Patient has been currently taking no medications. Patient is not currently on an ACE inhibitor/ARB. Patient has not seen an ophthalmologist this year. Patient denies any issues with their feet. The symptom started onset as an adult hypertension hyperlipidemia ARE RELATED TO DM   Hypertension Patient is currently on amlodipine 2.5, and their blood pressure today is 111/64. Patient denies any lightheadedness or dizziness. Patient denies headaches, blurred vision, chest pains, shortness of breath, or weakness. Denies any side effects from medication and is content with current medication.   Hyperlipidemia Patient is coming in for recheck of his hyperlipidemia. The patient is currently taking fish oil and atorvastatin. They deny any issues with myalgias or history of liver damage from it. They deny any focal numbness or weakness or chest pain.   Relevant past medical, surgical, family and social history reviewed and updated as indicated. Interim medical history since our last visit reviewed. Allergies and medications reviewed and updated.  Review of Systems  Constitutional: Negative for chills and fever.  Eyes: Negative for visual disturbance.  Respiratory: Negative for shortness of breath and wheezing.   Cardiovascular: Negative for chest pain and leg swelling.  Musculoskeletal: Negative for back pain and gait problem.  Skin: Negative for rash.  Neurological: Negative for dizziness, weakness and light-headedness.  All other systems reviewed and are negative.   Per HPI unless specifically indicated  above   Allergies as of 07/12/2020      Reactions   Amoxicillin Rash   Prednisone Rash   Sulfa Antibiotics Other (See Comments)   "upset stomach"   Triamcinolone Rash      Medication List       Accurate as of July 12, 2020  1:27 PM. If you have any questions, ask your nurse or doctor.        amLODipine 2.5 MG tablet Commonly known as: NORVASC Take 1 tablet (2.5 mg total) by mouth daily.   aspirin 81 MG tablet Take 81 mg by mouth every morning.   atorvastatin 20 MG tablet Commonly known as: LIPITOR Take 1 tablet (20 mg total) by mouth at bedtime as needed. What changed: See the new instructions. Changed by: Fransisca Kaufmann Devony Mcgrady, MD   finasteride 5 MG tablet Commonly known as: PROSCAR Take 1 tablet (5 mg total) by mouth every morning.   fish oil-omega-3 fatty acids 1000 MG capsule Take 1 g by mouth daily. 5 days per week   fluticasone 50 MCG/ACT nasal spray Commonly known as: FLONASE PLACE 2 SPRAYS INTO BOTH NOSTRILS AS NEEDED FOR ALLERGIES OR RHINITIS.   minocycline 100 MG capsule Commonly known as: Minocin Take 1 capsule (100 mg total) by mouth 2 (two) times daily. Take on an empty stomach   naproxen 375 MG tablet Commonly known as: NAPROSYN TAKE 1 TABLET BY MOUTH TWICE DAILY WITH A MEAL   ZINC PO Take 1 tablet by mouth once a week.   zolpidem 5 MG tablet Commonly known as: AMBIEN Take 1 tablet (5 mg  total) by mouth at bedtime as needed for sleep.        Objective:   BP 111/64   Pulse 73   Temp 97.9 F (36.6 C)   Ht '5\' 8"'  (1.727 m)   Wt 190 lb 12.8 oz (86.5 kg)   SpO2 96%   BMI 29.01 kg/m   Wt Readings from Last 3 Encounters:  07/12/20 190 lb 12.8 oz (86.5 kg)  12/21/19 189 lb (85.7 kg)  07/02/19 194 lb (88 kg)    Physical Exam Vitals and nursing note reviewed.  Constitutional:      General: He is not in acute distress.    Appearance: He is well-developed. He is not diaphoretic.  Eyes:     General: No scleral icterus.     Conjunctiva/sclera: Conjunctivae normal.  Neck:     Thyroid: No thyromegaly.  Cardiovascular:     Rate and Rhythm: Normal rate and regular rhythm.     Heart sounds: Normal heart sounds. No murmur heard.   Pulmonary:     Effort: Pulmonary effort is normal. No respiratory distress.     Breath sounds: Normal breath sounds. No wheezing.  Musculoskeletal:        General: Normal range of motion.     Cervical back: Neck supple.  Lymphadenopathy:     Cervical: No cervical adenopathy.  Skin:    General: Skin is warm and dry.     Findings: No rash.  Neurological:     Mental Status: He is alert and oriented to person, place, and time.     Coordination: Coordination normal.  Psychiatric:        Behavior: Behavior normal.       Assessment & Plan:   Problem List Items Addressed This Visit      Cardiovascular and Mediastinum   HTN (hypertension)   Relevant Medications   atorvastatin (LIPITOR) 20 MG tablet   Other Relevant Orders   CBC with Differential/Platelet   CMP14+EGFR     Other   HLD (hyperlipidemia)   Relevant Medications   atorvastatin (LIPITOR) 20 MG tablet   Other Relevant Orders   CBC with Differential/Platelet   CMP14+EGFR   Lipid panel   Prediabetes   Relevant Orders   Bayer DCA Hb A1c Waived    Other Visit Diagnoses    Insomnia, unspecified type    -  Primary   Relevant Medications   zolpidem (AMBIEN) 5 MG tablet   Controlled substance agreement signed       Relevant Orders   ToxASSURE Select 13 (MW), Urine    Continue current medication, refilled Ambien, will do blood work, no other changes in medication.  Follow up plan: Return in about 6 months (around 01/11/2021), or if symptoms worsen or fail to improve, for Insomnia and prediabetes and hypertension.  Counseling provided for all of the vaccine components Orders Placed This Encounter  Procedures  . ToxASSURE Select 13 (MW), Urine  . Bayer DCA Hb A1c Waived  . CBC with Differential/Platelet  .  CMP14+EGFR  . Lipid panel    Caryl Pina, MD [facility] 07/12/2020, 1:27 PM

## 2020-07-13 LAB — CMP14+EGFR
ALT: 22 IU/L (ref 0–44)
AST: 25 IU/L (ref 0–40)
Albumin/Globulin Ratio: 2 (ref 1.2–2.2)
Albumin: 4.7 g/dL — ABNORMAL HIGH (ref 3.6–4.6)
Alkaline Phosphatase: 98 IU/L (ref 44–121)
BUN/Creatinine Ratio: 14 (ref 10–24)
BUN: 17 mg/dL (ref 8–27)
Bilirubin Total: 0.7 mg/dL (ref 0.0–1.2)
CO2: 23 mmol/L (ref 20–29)
Calcium: 9.7 mg/dL (ref 8.6–10.2)
Chloride: 99 mmol/L (ref 96–106)
Creatinine, Ser: 1.19 mg/dL (ref 0.76–1.27)
Globulin, Total: 2.3 g/dL (ref 1.5–4.5)
Glucose: 102 mg/dL — ABNORMAL HIGH (ref 65–99)
Potassium: 4.7 mmol/L (ref 3.5–5.2)
Sodium: 136 mmol/L (ref 134–144)
Total Protein: 7 g/dL (ref 6.0–8.5)
eGFR: 61 mL/min/{1.73_m2} (ref >59)

## 2020-07-13 LAB — CBC WITH DIFFERENTIAL/PLATELET
Basophils Absolute: 0.1 10*3/uL (ref 0.0–0.2)
Basos: 0 %
EOS (ABSOLUTE): 0.2 10*3/uL (ref 0.0–0.4)
Eos: 1 %
Hematocrit: 50 % (ref 37.5–51.0)
Hemoglobin: 16.7 g/dL (ref 13.0–17.7)
Immature Grans (Abs): 0 10*3/uL (ref 0.0–0.1)
Immature Granulocytes: 0 %
Lymphocytes Absolute: 3 10*3/uL (ref 0.7–3.1)
Lymphs: 26 %
MCH: 29.3 pg (ref 26.6–33.0)
MCHC: 33.4 g/dL (ref 31.5–35.7)
MCV: 88 fL (ref 79–97)
Monocytes Absolute: 0.9 10*3/uL (ref 0.1–0.9)
Monocytes: 8 %
Neutrophils Absolute: 7.2 10*3/uL — ABNORMAL HIGH (ref 1.4–7.0)
Neutrophils: 65 %
Platelets: 257 10*3/uL (ref 150–450)
RBC: 5.69 x10E6/uL (ref 4.14–5.80)
RDW: 12.9 % (ref 11.6–15.4)
WBC: 11.3 10*3/uL — ABNORMAL HIGH (ref 3.4–10.8)

## 2020-07-13 LAB — LIPID PANEL
Chol/HDL Ratio: 3.1 ratio (ref 0.0–5.0)
Cholesterol, Total: 148 mg/dL (ref 100–199)
HDL: 48 mg/dL (ref >39)
LDL Chol Calc (NIH): 81 mg/dL (ref 0–99)
Triglycerides: 101 mg/dL (ref 0–149)
VLDL Cholesterol Cal: 19 mg/dL (ref 5–40)

## 2020-07-19 ENCOUNTER — Ambulatory Visit (INDEPENDENT_AMBULATORY_CARE_PROVIDER_SITE_OTHER): Payer: Medicare PPO

## 2020-07-19 VITALS — Ht 68.0 in | Wt 190.0 lb

## 2020-07-19 DIAGNOSIS — Z0000 Encounter for general adult medical examination without abnormal findings: Secondary | ICD-10-CM

## 2020-07-19 LAB — TOXASSURE SELECT 13 (MW), URINE

## 2020-07-19 NOTE — Progress Notes (Signed)
Subjective:   Terry Winters is Winters 86 y.o. male who presents for Medicare Annual/Subsequent preventive examination.  Virtual Visit via Telephone Note  I connected with  Terry Winters on 07/19/20 at  9:45 AM EDT by telephone and verified that I am speaking with the correct person using two identifiers.  Location: Patient: Home Provider: [facility] Persons participating in the virtual visit: patient/Nurse Health Advisor   I discussed the limitations, risks, security and privacy concerns of performing an evaluation and management service by telephone and the availability of in person appointments. The patient expressed understanding and agreed to proceed.  Interactive audio and video telecommunications were attempted between this nurse and patient, however failed, due to patient having technical difficulties OR patient did not have access to video capability.  We continued and completed visit with audio only.  Some vital signs may be absent or patient reported.   Ileana Chalupa E Fermon Ureta, LPN   Review of Systems     Cardiac Risk Factors include: advanced age (>9men, >37 women);dyslipidemia;hypertension;male gender     Objective:    Today's Vitals   07/19/20 0957  Weight: 190 lb (86.2 kg)  Height: 5\' 8"  (1.727 m)   Body mass index is 28.89 kg/m.  Advanced Directives 07/19/2020 10/16/2017 11/14/2016 11/10/2014  Does Patient Have Winters Medical Advance Directive? No No No No  Would patient like information on creating Winters medical advance directive? No - Patient declined No - Patient declined No - Patient declined No - patient declined information    Current Medications (verified) Outpatient Encounter Medications as of 07/19/2020  Medication Sig  . amLODipine (NORVASC) 2.5 MG tablet Take 1 tablet (2.5 mg total) by mouth daily.  09/18/2020 aspirin 81 MG tablet Take 81 mg by mouth every morning.   Marland Kitchen atorvastatin (LIPITOR) 20 MG tablet Take 1 tablet (20 mg total) by mouth at bedtime as needed.  . finasteride (PROSCAR)  5 MG tablet Take 1 tablet (5 mg total) by mouth every morning.  . fish oil-omega-3 fatty acids 1000 MG capsule Take 1 g by mouth daily. 5 days per week  . fluticasone (FLONASE) 50 MCG/ACT nasal spray PLACE 2 SPRAYS INTO BOTH NOSTRILS AS NEEDED FOR ALLERGIES OR RHINITIS.  Marland Kitchen minocycline (MINOCIN) 100 MG capsule Take 1 capsule (100 mg total) by mouth 2 (two) times daily. Take on an empty stomach  . Multiple Vitamins-Minerals (ZINC PO) Take 1 tablet by mouth once Winters week.   . naproxen (NAPROSYN) 375 MG tablet TAKE 1 TABLET BY MOUTH TWICE DAILY WITH Winters MEAL  . zolpidem (AMBIEN) 5 MG tablet Take 1 tablet (5 mg total) by mouth at bedtime as needed for sleep.   No facility-administered encounter medications on file as of 07/19/2020.    Allergies (verified) Amoxicillin, Prednisone, Sulfa antibiotics, and Triamcinolone   History: Past Medical History:  Diagnosis Date  . BPH (benign prostatic hypertrophy)   . Foley catheter in place   . GERD (gastroesophageal reflux disease)    watches diet  . History of gout    as of 11-12-2016  per pt stable  . Hyperlipidemia   . Hypertension    per pt stopped his medication losartan on his own--- last taken 2nd week sept. 2018--- per pt checks bp and has been ok and watches diet  . Seasonal allergies   . Type 2 diabetes, diet controlled (HCC)   . Urinary retention   . Vitamin D deficiency   . Wears glasses   . Wears partial dentures  upper   Past Surgical History:  Procedure Laterality Date  . ANTERIOR CERVICAL DECOMP/DISCECTOMY FUSION  1993  . TONSILLECTOMY  child  . TRANSURETHRAL RESECTION OF PROSTATE N/Winters 11/14/2016   Procedure: TRANSURETHRAL RESECTION OF THE PROSTATE (TURP);  Surgeon: Terry Pippin, MD;  Location: [facility];  Service: Urology;  Laterality: N/Winters;   Family History  Problem Relation Age of Onset  . ALS Father    Social History   Socioeconomic History  . Marital status: Married    Spouse name: Terry Winters  . Number of  children: 3  . Years of education: Not on file  . Highest education level: Not on file  Occupational History  . Occupation: retired  Tobacco Use  . Smoking status: Former Smoker    Packs/day: 0.50    Years: 15.00    Pack years: 7.50    Types: Cigars, Cigarettes    Quit date: 06/16/1966    Years since quitting: 54.1  . Smokeless tobacco: Never Used  Vaping Use  . Vaping Use: Never used  Substance and Sexual Activity  . Alcohol use: Yes    Comment: occasionally  . Drug use: No  . Sexual activity: Not on file  Other Topics Concern  . Not on file  Social History Narrative   07/19/20 - Lives home with wife and 2 teenage grandsons. Enjoys playing golf. Cuts hay, mows, lots of yard work, very active   Social Determinants of Corporate investment banker Strain: Low Risk   . Difficulty of Paying Living Expenses: Not very hard  Food Insecurity: No Food Insecurity  . Worried About Programme researcher, broadcasting/film/video in the Last Year: Never true  . Ran Out of Food in the Last Year: Never true  Transportation Needs: No Transportation Needs  . Lack of Transportation (Medical): No  . Lack of Transportation (Non-Medical): No  Physical Activity: Sufficiently Active  . Days of Exercise per Week: 5 days  . Minutes of Exercise per Session: 30 min  Stress: No Stress Concern Present  . Feeling of Stress : Not at all  Social Connections: Moderately Isolated  . Frequency of Communication with Friends and Family: More than three times Winters week  . Frequency of Social Gatherings with Friends and Family: More than three times Winters week  . Attends Religious Services: Never  . Active Member of Clubs or Organizations: No  . Attends Banker Meetings: Never  . Marital Status: Married    Tobacco Counseling Counseling given: Not Answered   Clinical Intake:  Pre-visit preparation completed: Yes  Pain : No/denies pain     BMI - recorded: 28.89 Nutritional Status: BMI 25 -29 Overweight Nutritional  Risks: None Diabetes: No  How often do you need to have someone help you when you read instructions, pamphlets, or other written materials from your doctor or pharmacy?: 1 - Never  Diabetic? No - Pre-diabetic  Interpreter Needed?: No  Information entered by :: Terry Lascala, LPN   Activities of Daily Living In your present state of health, do you have any difficulty performing the following activities: 07/19/2020  Hearing? N  Vision? N  Difficulty concentrating or making decisions? N  Walking or climbing stairs? N  Dressing or bathing? N  Doing errands, shopping? N  Preparing Food and eating ? N  Using the Toilet? N  In the past six months, have you accidently leaked urine? Y  Comment routine visits with Dr Annabell Howells  Do you have problems with loss of  bowel control? N  Managing your Medications? N  Managing your Finances? N  Housekeeping or managing your Housekeeping? N  Some recent data might be hidden    Patient Care Team: Dettinger, Terry RadonJoshua A, MD as PCP - General (Family Medicine) Terry PippinWrenn, John, MD as Attending Physician (Urology)  Indicate any recent Medical Services you may have received from other than [facility] providers in the past year (date may be approximate).     Assessment:   This is Winters routine wellness examination for Terry FallenKenneth.  Hearing/Vision screen  Hearing Screening   125Hz  250Hz  500Hz  1000Hz  2000Hz  3000Hz  4000Hz  6000Hz  8000Hz   Right ear:           Left ear:           Comments: Denies hearing difficulties   Vision Screening Comments: Wears eyeglasses - behind [place]  Dietary issues and exercise activities discussed: Current Exercise Habits: Home exercise routine, Type of exercise: walking, Time (Minutes): 30, Frequency (Times/Week): 7, Weekly Exercise (Minutes/Week): 210, Intensity: Mild, Exercise limited by: orthopedic condition(s)  Goals Addressed            This Visit's Progress   . DIET - EAT MORE FRUITS AND  VEGETABLES   On track   . DIET - INCREASE WATER INTAKE   On track     Depression Screen PHQ 2/9 Scores 07/19/2020 07/12/2020 12/21/2019 07/02/2019 06/29/2019 04/13/2019 07/02/2018  PHQ - 2 Score 0 0 1 0 0 0 0    Fall Risk Fall Risk  07/19/2020 07/12/2020 07/12/2020 12/21/2019 07/02/2019  Falls in the past year? 0 1 0 0 0  Number falls in past yr: 0 0 - - -  Injury with Fall? 0 0 - - -  Risk for fall due to : Impaired vision;Orthopedic patient History of fall(s) - - -  Risk for fall due to: Comment - - - - -  Follow up Falls prevention discussed Falls evaluation completed - - -    FALL RISK PREVENTION PERTAINING TO THE HOME:  Any stairs in or around the home? Yes  If so, are there any without handrails? No  Home free of loose throw rugs in walkways, pet beds, electrical cords, etc? Yes  Adequate lighting in your home to reduce risk of falls? Yes   ASSISTIVE DEVICES UTILIZED TO PREVENT FALLS:  Life alert? No  Use of Winters cane, walker or w/c? No  Grab bars in the bathroom? Yes  Shower chair or bench in shower? Yes  Elevated toilet seat or Winters handicapped toilet? Yes   TIMED UP AND GO:  Was the test performed? No . Telephonic visit.  Cognitive Function: MMSE - Mini Mental State Exam 10/16/2017 11/10/2014  Orientation to time 5 5  Orientation to Place 5 5  Registration 3 3  Attention/ Calculation 5 5  Recall 3 3  Language- name 2 objects 2 2  Language- repeat 1 1  Language- follow 3 step command 3 3  Language- read & follow direction 1 1  Write Winters sentence 1 1  Copy design 1 1  Total score 30 30     6CIT Screen 07/19/2020  What Year? 0 points  What month? 0 points  What time? 0 points  Count back from 20 0 points  Months in reverse 0 points  Repeat phrase 0 points  Total Score 0    Immunizations Immunization History  Administered Date(s) Administered  . Influenza, High Dose Seasonal PF 12/17/2016, 12/01/2017,  12/09/2018, 12/08/2019  . Influenza,inj,Quad PF,6+ Mos 12/30/2013,  11/10/2014, 01/08/2016  . Moderna Sars-Covid-2 Vaccination 03/10/2019, 04/07/2019, 10/28/2019  . Pneumococcal Conjugate-13 01/08/2016  . Pneumococcal Polysaccharide-23 07/16/2013  . Tdap 12/21/2019    TDAP status: Up to date  Flu Vaccine status: Up to date  Pneumococcal vaccine status: Up to date  Covid-19 vaccine status: Completed vaccines  Qualifies for Shingles Vaccine? Yes   Zostavax completed Yes   Shingrix Completed?: No.    Education has been provided regarding the importance of this vaccine. Patient has been advised to call insurance company to determine out of pocket expense if they have not yet received this vaccine. Advised may also receive vaccine at local pharmacy or Health Dept. Verbalized acceptance and understanding.  Screening Tests Health Maintenance  Topic Date Due  . Pneumococcal Vaccine 4-53 Years old (1 of 4 - PCV13) Never done  . Zoster Vaccines- Shingrix (1 of 2) Never done  . INFLUENZA VACCINE  09/11/2020  . TETANUS/TDAP  12/20/2029  . COVID-19 Vaccine  Completed  . PNA vac Low Risk Adult  Completed  . HPV VACCINES  Aged Out    Health Maintenance  Health Maintenance Due  Topic Date Due  . Pneumococcal Vaccine 50-30 Years old (1 of 4 - PCV13) Never done  . Zoster Vaccines- Shingrix (1 of 2) Never done    Colorectal cancer screening: No longer required.   Lung Cancer Screening: (Low Dose CT Chest recommended if Age 15-80 years, 30 pack-year currently smoking OR have quit w/in 15years.) does not qualify.   Additional Screening:  Hepatitis C Screening: does not qualify  Vision Screening: Recommended annual ophthalmology exams for early detection of glaucoma and other disorders of the eye. Is the patient up to date with their annual eye exam?  No  Who is the provider or what is the name of the office in which the patient attends annual eye exams? [facility] If pt is not established with Winters provider, would they like to be referred to Winters provider to  establish care? No .   Dental Screening: Recommended annual dental exams for proper oral hygiene  Community Resource Referral / Chronic Care Management: CRR required this visit?  No   CCM required this visit?  No      Plan:     I have personally reviewed and noted the following in the patient's chart:   . Medical and social history . Use of alcohol, tobacco or illicit drugs  . Current medications and supplements including opioid prescriptions. Patient is not currently taking opioid prescriptions. . Functional ability and status . Nutritional status . Physical activity . Advanced directives . List of other physicians . Hospitalizations, surgeries, and ER visits in previous 12 months . Vitals . Screenings to include cognitive, depression, and falls . Referrals and appointments  In addition, I have reviewed and discussed with patient certain preventive protocols, quality metrics, and best practice recommendations. Winters written personalized care plan for preventive services as well as general preventive health recommendations were provided to patient.     [place] Constable, LPN   07/18/3417   Nurse Notes: None

## 2020-07-19 NOTE — Patient Instructions (Signed)
Terry Winters , Thank you for taking time to come for your Medicare Wellness Visit. I appreciate your ongoing commitment to your health goals. Please review the following plan we discussed and let me know if I can assist you in the future.   Screening recommendations/referrals: Colonoscopy: No longer required Recommended yearly ophthalmology/optometry visit for glaucoma screening and checkup Recommended yearly dental visit for hygiene and checkup  Vaccinations: Influenza vaccine: Done 12/08/2019 - Repeat annually Pneumococcal vaccine: Done 07/16/2013 & 01/08/2016 Tdap vaccine: 12/21/2019 Shingles vaccine: Due   Covid-19: Done 03/10/2019, 04/07/2019, & 10/28/2019  Advanced directives: Please bring a copy of your health care power of attorney and living will to the office to be added to your chart at your convenience.  Conditions/risks identified: Aim for 30 minutes of exercise or brisk walking each day, drink 6-8 glasses of water and eat lots of fruits and vegetables.  Next appointment: Follow up in one year for your annual wellness visit.   Preventive Care 58 Years and Older, Male  Preventive care refers to lifestyle choices and visits with your health care provider that can promote health and wellness. What does preventive care include?  A yearly physical exam. This is also called an annual well check.  Dental exams once or twice a year.  Routine eye exams. Ask your health care provider how often you should have your eyes checked.  Personal lifestyle choices, including:  Daily care of your teeth and gums.  Regular physical activity.  Eating a healthy diet.  Avoiding tobacco and drug use.  Limiting alcohol use.  Practicing safe sex.  Taking low doses of aspirin every day.  Taking vitamin and mineral supplements as recommended by your health care provider. What happens during an annual well check? The services and screenings done by your health care provider during your annual  well check will depend on your age, overall health, lifestyle risk factors, and family history of disease. Counseling  Your health care provider may ask you questions about your:  Alcohol use.  Tobacco use.  Drug use.  Emotional well-being.  Home and relationship well-being.  Sexual activity.  Eating habits.  History of falls.  Memory and ability to understand (cognition).  Work and work Astronomer. Screening  You may have the following tests or measurements:  Height, weight, and BMI.  Blood pressure.  Lipid and cholesterol levels. These may be checked every 5 years, or more frequently if you are over 58 years old.  Skin check.  Lung cancer screening. You may have this screening every year starting at age 74 if you have a 30-pack-year history of smoking and currently smoke or have quit within the past 15 years.  Fecal occult blood test (FOBT) of the stool. You may have this test every year starting at age 54.  Flexible sigmoidoscopy or colonoscopy. You may have a sigmoidoscopy every 5 years or a colonoscopy every 10 years starting at age 39.  Prostate cancer screening. Recommendations will vary depending on your family history and other risks.  Hepatitis C blood test.  Hepatitis B blood test.  Sexually transmitted disease (STD) testing.  Diabetes screening. This is done by checking your blood sugar (glucose) after you have not eaten for a while (fasting). You may have this done every 1-3 years.  Abdominal aortic aneurysm (AAA) screening. You may need this if you are a current or former smoker.  Osteoporosis. You may be screened starting at age 58 if you are at high risk. Talk with your  health care provider about your test results, treatment options, and if necessary, the need for more tests. Vaccines  Your health care provider may recommend certain vaccines, such as:  Influenza vaccine. This is recommended every year.  Tetanus, diphtheria, and acellular  pertussis (Tdap, Td) vaccine. You may need a Td booster every 10 years.  Zoster vaccine. You may need this after age 103.  Pneumococcal 13-valent conjugate (PCV13) vaccine. One dose is recommended after age 51.  Pneumococcal polysaccharide (PPSV23) vaccine. One dose is recommended after age 34. Talk to your health care provider about which screenings and vaccines you need and how often you need them. This information is not intended to replace advice given to you by your health care provider. Make sure you discuss any questions you have with your health care provider. Document Released: 02/24/2015 Document Revised: 10/18/2015 Document Reviewed: 11/29/2014 Elsevier Interactive Patient Education  2017 [place] Prevention in the Home Falls can cause injuries. They can happen to people of all ages. There are many things you can do to make your home safe and to help prevent falls. What can I do on the outside of my home?  Regularly fix the edges of walkways and driveways and fix any cracks.  Remove anything that might make you trip as you walk through a door, such as a raised step or threshold.  Trim any bushes or trees on the path to your home.  Use bright outdoor lighting.  Clear any walking paths of anything that might make someone trip, such as rocks or tools.  Regularly check to see if handrails are loose or broken. Make sure that both sides of any steps have handrails.  Any raised decks and porches should have guardrails on the edges.  Have any leaves, snow, or ice cleared regularly.  Use sand or salt on walking paths during winter.  Clean up any spills in your garage right away. This includes oil or grease spills. What can I do in the bathroom?  Use night lights.  Install grab bars by the toilet and in the tub and shower. Do not use towel bars as grab bars.  Use non-skid mats or decals in the tub or shower.  If you need to sit down in the shower, use a plastic,  non-slip stool.  Keep the floor dry. Clean up any water that spills on the floor as soon as it happens.  Remove soap buildup in the tub or shower regularly.  Attach bath mats securely with double-sided non-slip rug tape.  Do not have throw rugs and other things on the floor that can make you trip. What can I do in the bedroom?  Use night lights.  Make sure that you have a light by your bed that is easy to reach.  Do not use any sheets or blankets that are too big for your bed. They should not hang down onto the floor.  Have a firm chair that has side arms. You can use this for support while you get dressed.  Do not have throw rugs and other things on the floor that can make you trip. What can I do in the kitchen?  Clean up any spills right away.  Avoid walking on wet floors.  Keep items that you use a lot in easy-to-reach places.  If you need to reach something above you, use a strong step stool that has a grab bar.  Keep electrical cords out of the way.  Do not use  floor polish or wax that makes floors slippery. If you must use wax, use non-skid floor wax.  Do not have throw rugs and other things on the floor that can make you trip. What can I do with my stairs?  Do not leave any items on the stairs.  Make sure that there are handrails on both sides of the stairs and use them. Fix handrails that are broken or loose. Make sure that handrails are as long as the stairways.  Check any carpeting to make sure that it is firmly attached to the stairs. Fix any carpet that is loose or worn.  Avoid having throw rugs at the top or bottom of the stairs. If you do have throw rugs, attach them to the floor with carpet tape.  Make sure that you have a light switch at the top of the stairs and the bottom of the stairs. If you do not have them, ask someone to add them for you. What else can I do to help prevent falls?  Wear shoes that:  Do not have high heels.  Have rubber  bottoms.  Are comfortable and fit you well.  Are closed at the toe. Do not wear sandals.  If you use a stepladder:  Make sure that it is fully opened. Do not climb a closed stepladder.  Make sure that both sides of the stepladder are locked into place.  Ask someone to hold it for you, if possible.  Clearly mark and make sure that you can see:  Any grab bars or handrails.  First and last steps.  Where the edge of each step is.  Use tools that help you move around (mobility aids) if they are needed. These include:  Canes.  Walkers.  Scooters.  Crutches.  Turn on the lights when you go into a dark area. Replace any light bulbs as soon as they burn out.  Set up your furniture so you have a clear path. Avoid moving your furniture around.  If any of your floors are uneven, fix them.  If there are any pets around you, be aware of where they are.  Review your medicines with your doctor. Some medicines can make you feel dizzy. This can increase your chance of falling. Ask your doctor what other things that you can do to help prevent falls. This information is not intended to replace advice given to you by your health care provider. Make sure you discuss any questions you have with your health care provider. Document Released: 11/24/2008 Document Revised: 07/06/2015 Document Reviewed: 03/04/2014 Elsevier Interactive Patient Education  2017 [employer].

## 2020-07-30 ENCOUNTER — Other Ambulatory Visit: Payer: Self-pay | Admitting: Family Medicine

## 2020-07-31 ENCOUNTER — Other Ambulatory Visit: Payer: Self-pay | Admitting: Family Medicine

## 2020-07-31 DIAGNOSIS — M25512 Pain in left shoulder: Secondary | ICD-10-CM

## 2020-07-31 DIAGNOSIS — G8929 Other chronic pain: Secondary | ICD-10-CM

## 2020-08-02 ENCOUNTER — Other Ambulatory Visit: Payer: Self-pay | Admitting: Family Medicine

## 2020-08-02 DIAGNOSIS — E782 Mixed hyperlipidemia: Secondary | ICD-10-CM

## 2020-08-26 ENCOUNTER — Other Ambulatory Visit: Payer: Self-pay | Admitting: Family Medicine

## 2020-08-26 DIAGNOSIS — M25512 Pain in left shoulder: Secondary | ICD-10-CM

## 2020-08-26 DIAGNOSIS — G8929 Other chronic pain: Secondary | ICD-10-CM

## 2020-09-25 ENCOUNTER — Other Ambulatory Visit: Payer: Self-pay | Admitting: Family Medicine

## 2020-09-25 DIAGNOSIS — G8929 Other chronic pain: Secondary | ICD-10-CM

## 2020-09-25 DIAGNOSIS — M25512 Pain in left shoulder: Secondary | ICD-10-CM

## 2020-10-30 ENCOUNTER — Other Ambulatory Visit: Payer: Self-pay | Admitting: Family Medicine

## 2020-10-30 DIAGNOSIS — G8929 Other chronic pain: Secondary | ICD-10-CM

## 2020-10-30 DIAGNOSIS — M25512 Pain in left shoulder: Secondary | ICD-10-CM

## 2020-11-13 ENCOUNTER — Ambulatory Visit (INDEPENDENT_AMBULATORY_CARE_PROVIDER_SITE_OTHER): Payer: Medicare PPO

## 2020-11-13 ENCOUNTER — Other Ambulatory Visit: Payer: Self-pay

## 2020-11-13 DIAGNOSIS — Z23 Encounter for immunization: Secondary | ICD-10-CM | POA: Diagnosis not present

## 2020-11-24 ENCOUNTER — Other Ambulatory Visit: Payer: Self-pay | Admitting: Family Medicine

## 2020-11-24 DIAGNOSIS — M25512 Pain in left shoulder: Secondary | ICD-10-CM

## 2020-11-24 DIAGNOSIS — G8929 Other chronic pain: Secondary | ICD-10-CM

## 2020-11-30 DIAGNOSIS — Z23 Encounter for immunization: Secondary | ICD-10-CM | POA: Diagnosis not present

## 2020-12-23 ENCOUNTER — Other Ambulatory Visit: Payer: Self-pay | Admitting: Family Medicine

## 2020-12-23 DIAGNOSIS — G8929 Other chronic pain: Secondary | ICD-10-CM

## 2021-01-24 ENCOUNTER — Other Ambulatory Visit: Payer: Self-pay | Admitting: Family Medicine

## 2021-01-24 DIAGNOSIS — M25512 Pain in left shoulder: Secondary | ICD-10-CM

## 2021-01-24 DIAGNOSIS — G8929 Other chronic pain: Secondary | ICD-10-CM

## 2021-01-26 ENCOUNTER — Encounter: Payer: Self-pay | Admitting: Family Medicine

## 2021-01-26 ENCOUNTER — Ambulatory Visit: Payer: Medicare PPO | Admitting: Family Medicine

## 2021-01-26 VITALS — BP 129/68 | HR 74 | Temp 97.5°F | Ht 68.0 in | Wt 193.1 lb

## 2021-01-26 DIAGNOSIS — M25462 Effusion, left knee: Secondary | ICD-10-CM | POA: Diagnosis not present

## 2021-01-26 NOTE — Progress Notes (Signed)
Acute Office Visit  Subjective:    Patient ID: Terry Winters, male    DOB: 10/27/1934, 86 y.o.   MRN: [id number]  Chief Complaint  Patient presents with   Joint Swelling    HPI Patient is in today for swelling in his left knee for 2-3 weeks. This has been improving. He also reports an ache in the knee. The pain is mild to moderate and worsens with activity. He denies injury, warmth, fever, drainage, decreased ROM, numbness, or tingling. He has been using a muscle cream and lidocaine spray with some relief. He has also taken naproxen a few times with some relief.   Past Medical History:  Diagnosis Date   BPH (benign prostatic hypertrophy)    Foley catheter in place    GERD (gastroesophageal reflux disease)    watches diet   History of gout    as of 11-12-2016  per pt stable   Hyperlipidemia    Hypertension    per pt stopped his medication losartan on his own--- last taken 2nd week sept. 2018--- per pt checks bp and has been ok and watches diet   Seasonal allergies    Type 2 diabetes, diet controlled ([place])    Urinary retention    Vitamin D deficiency    Wears glasses    Wears partial dentures    upper    Past Surgical History:  Procedure Laterality Date   ANTERIOR CERVICAL DECOMP/DISCECTOMY FUSION  1993   TONSILLECTOMY  child   TRANSURETHRAL RESECTION OF PROSTATE N/A 11/14/2016   Procedure: TRANSURETHRAL RESECTION OF THE PROSTATE (TURP);  Surgeon: Irine Seal, MD;  Location: [facility];  Service: Urology;  Laterality: N/A;    Family History  Problem Relation Age of Onset   ALS Father     Social History   Socioeconomic History   Marital status: Married    Spouse name: Letta Median   Number of children: 3   Years of education: Not on file   Highest education level: Not on file  Occupational History   Occupation: retired  Tobacco Use   Smoking status: Former    Packs/day: 0.50    Years: 15.00    Pack years: 7.50    Types: Cigars, Cigarettes    Quit  date: 06/16/1966    Years since quitting: 52.6   Smokeless tobacco: Never  Vaping Use   Vaping Use: Never used  Substance and Sexual Activity   Alcohol use: Yes    Comment: occasionally   Drug use: No   Sexual activity: Not on file  Other Topics Concern   Not on file  Social History Narrative   07/19/20 - Lives home with wife and 2 teenage grandsons. Enjoys playing golf. Cuts hay, mows, lots of yard work, very active   Social Determinants of Radio broadcast assistant Strain: Low Risk    Difficulty of Paying Living Expenses: Not very hard  Food Insecurity: No Food Insecurity   Worried About Charity fundraiser in the Last Year: Never true   Arboriculturist in the Last Year: Never true  Transportation Needs: No Transportation Needs   Lack of Transportation (Medical): No   Lack of Transportation (Non-Medical): No  Physical Activity: Sufficiently Active   Days of Exercise per Week: 5 days   Minutes of Exercise per Session: 30 min  Stress: No Stress Concern Present   Feeling of Stress : Not at all  Social Connections: Moderately Isolated   Frequency of  Communication with Friends and Family: More than three times a week   Frequency of Social Gatherings with Friends and Family: More than three times a week   Attends Religious Services: Never   Marine scientist or Organizations: No   Attends Music therapist: Never   Marital Status: Married  Human resources officer Violence: Not At Risk   Fear of Current or Ex-Partner: No   Emotionally Abused: No   Physically Abused: No   Sexually Abused: No    Outpatient Medications Prior to Visit  Medication Sig Dispense Refill   amLODipine (NORVASC) 2.5 MG tablet Take 1 tablet (2.5 mg total) by mouth daily. 90 tablet 3   atorvastatin (LIPITOR) 20 MG tablet TAKE 1 TABLET EVERY DAY AT 6 PM 90 tablet 3   finasteride (PROSCAR) 5 MG tablet TAKE 1 TABLET BY MOUTH EVERY DAY IN THE MORNING 90 tablet 3   fish oil-omega-3 fatty acids 1000  MG capsule Take 1 g by mouth daily. 5 days per week     fluticasone (FLONASE) 50 MCG/ACT nasal spray PLACE 2 SPRAYS INTO BOTH NOSTRILS AS NEEDED FOR ALLERGIES OR RHINITIS. 48 mL 1   Multiple Vitamins-Minerals (ZINC PO) Take 1 tablet by mouth once a week.      naproxen (NAPROSYN) 375 MG tablet Take 1 tablet (375 mg total) by mouth 2 (two) times daily with a meal. (NEEDS TO BE SEEN BEFORE NEXT REFILL) 60 tablet 0   zolpidem (AMBIEN) 5 MG tablet Take 1 tablet (5 mg total) by mouth at bedtime as needed for sleep. 30 tablet 5   aspirin 81 MG tablet Take 81 mg by mouth every morning.  (Patient not taking: Reported on 01/26/2021)     minocycline (MINOCIN) 100 MG capsule Take 1 capsule (100 mg total) by mouth 2 (two) times daily. Take on an empty stomach 20 capsule 0   No facility-administered medications prior to visit.    Allergies  Allergen Reactions   Amoxicillin Rash   Prednisone Rash   Sulfa Antibiotics Other (See Comments)    "upset stomach"   Triamcinolone Rash    Review of Systems As per HPI>     Objective:    Physical Exam Vitals and nursing note reviewed.  Constitutional:      General: He is not in acute distress.    Appearance: He is not ill-appearing, toxic-appearing or diaphoretic.  Pulmonary:     Effort: Pulmonary effort is normal. No respiratory distress.  Musculoskeletal:     Left knee: Swelling (slight, generalized) present. No erythema, ecchymosis or bony tenderness. Normal range of motion. No tenderness. Normal alignment and normal patellar mobility.     Right lower leg: No edema.     Left lower leg: No edema.  Skin:    General: Skin is warm and dry.  Neurological:     Mental Status: He is alert and oriented to person, place, and time.  Psychiatric:        Mood and Affect: Mood normal.        Behavior: Behavior normal.    BP 129/68 Comment: at home reading per pt   Pulse 74    Temp (!) 97.5 F (36.4 C) (Temporal)    Ht _0  (1.727 m)    Wt 193 lb 2 oz (87.6  kg)    BMI 29.36 kg/m  Wt Readings from Last 3 Encounters:  01/26/21 193 lb 2 oz (87.6 kg)  07/19/20 190 lb (86.2 kg)  07/12/20 190 lb  12.8 oz (86.5 kg)    Health Maintenance Due  Topic Date Due   Zoster Vaccines- Shingrix (1 of 2) Never done   COVID-19 Vaccine (4 - Booster for Moderna series) 12/23/2019    There are no preventive care reminders to display for this patient.   No results found for: TSH Lab Results  Component Value Date   WBC 11.3 (H) 07/12/2020   HGB 16.7 07/12/2020   HCT 50.0 07/12/2020   MCV 88 07/12/2020   PLT 257 07/12/2020   Lab Results  Component Value Date   NA 136 07/12/2020   K 4.7 07/12/2020   CO2 23 07/12/2020   GLUCOSE 102 (H) 07/12/2020   BUN 17 07/12/2020   CREATININE 1.19 07/12/2020   BILITOT 0.7 07/12/2020   ALKPHOS 98 07/12/2020   AST 25 07/12/2020   ALT 22 07/12/2020   PROT 7.0 07/12/2020   ALBUMIN 4.7 (H) 07/12/2020   CALCIUM 9.7 07/12/2020   EGFR 61 07/12/2020   Lab Results  Component Value Date   CHOL 148 07/12/2020   Lab Results  Component Value Date   HDL 48 07/12/2020   Lab Results  Component Value Date   LDLCALC 81 07/12/2020   Lab Results  Component Value Date   TRIG 101 07/12/2020   Lab Results  Component Value Date   CHOLHDL 3.1 07/12/2020   Lab Results  Component Value Date   HGBA1C 5.9 07/12/2020       Assessment & Plan:   Ruel was seen today for joint swelling.  Diagnoses and all orders for this visit:  Swelling of left knee joint Discussed likely due to arthritis in joint. Discussed RICE therapy.    Return for schedule chronic follow up with PCP.  The patient indicates understanding of these issues and agrees with the plan.   Gwenlyn Perking, FNP

## 2021-01-26 NOTE — Patient Instructions (Signed)
Arthritis Arthritis is a term that is commonly used to refer to joint pain or jointdisease. There are more than 100 types of arthritis. What are the causes? The most common cause of this condition is wear and tear of a joint. Other causes include: Gout. Inflammation of a joint. An infection of a joint. Sprains and other injuries near the joint. A reaction to medicines or drugs, or an allergic reaction. In some cases, the cause may not be known. What are the signs or symptoms? The main symptom of this condition is pain in the joint during movement. Other symptoms include: Redness, swelling, or stiffness at a joint. Warmth coming from the joint. Fever. Overall feeling of illness. How is this diagnosed? This condition may be diagnosed with a physical exam and tests, including: Blood tests. Urine tests. Imaging tests, such as X-rays, an MRI, or a CT scan. Sometimes, fluid is removed from a joint for testing. How is this treated? This condition may be treated with: Treatment of the cause, if it is known. Rest. Raising (elevating) the joint. Applying cold or hot packs to the joint. Medicines to improve symptoms and reduce inflammation. Injections of a steroid such as cortisone into the joint to help reduce pain and inflammation. Depending on the cause of your arthritis, you may need to make lifestyle changes to reduce stress on your joint. Changes may include: Exercising more. Losing weight. Follow these instructions at home: Medicines Take over-the-counter and prescription medicines only as told by your health care provider. Do not take aspirin to relieve pain if your health care provider thinks that gout may be causing your pain. Activity Rest your joint if told by your health care provider. Rest is important when your disease is active and your joint feels painful, swollen, or stiff. Avoid activities that make the pain worse. It is important to balance activity with  rest. Exercise your joint regularly with range-of-motion exercises as told by your health care provider. Try doing low-impact exercise, such as: Swimming. Water aerobics. Biking. Walking. Managing pain, stiffness, and swelling     If directed, put ice on the joint. Put ice in a plastic bag. Place a towel between your skin and the bag. Leave the ice on for 20 minutes, 2-3 times per day. If your joint is swollen, raise (elevate) it above the level of your heart if directed by your health care provider. If your joint feels stiff in the morning, try taking a warm shower. If directed, apply heat to the affected area as often as told by your health care provider. Use the heat source that your health care provider recommends, such as a moist heat pack or a heating pad. If you have diabetes, do not apply heat without permission from your health care provider. To apply heat: Place a towel between your skin and the heat source. Leave the heat on for 20-30 minutes. Remove the heat if your skin turns bright red. This is especially important if you are unable to feel pain, heat, or cold. You may have a greater risk of getting burned. General instructions Do not use any products that contain nicotine or tobacco, such as cigarettes, e-cigarettes, and chewing tobacco. If you need help quitting, ask your health care provider. Keep all follow-up visits as told by your health care provider. This is important. Contact a health care provider if: The pain gets worse. You have a fever. Get help right away if: You develop severe joint pain, swelling, or redness. Many joints   become painful and swollen. You develop severe back pain. You develop severe weakness in your leg. You cannot control your bladder or bowels. Summary Arthritis is a term that is commonly used to refer to joint pain or joint disease. There are more than 100 types of arthritis. The most common cause of this condition is wear and tear of a  joint. Other causes include gout, inflammation or infection of the joint, sprains, or allergies. Symptoms of this condition include redness, swelling, or stiffness of the joint. Other symptoms include warmth, fever, or feeling ill. This condition is treated with rest, elevation, medicines, and applying cold or hot packs. Follow your health care provider's instructions about medicines, activity, exercises, and other home care treatments. This information is not intended to replace advice given to you by your health care provider. Make sure you discuss any questions you have with your healthcare provider. Document Revised: 01/05/2018 Document Reviewed: 01/05/2018 Elsevier Patient Education  2022 Elsevier Inc.  

## 2021-01-27 ENCOUNTER — Other Ambulatory Visit: Payer: Self-pay | Admitting: Family Medicine

## 2021-02-22 ENCOUNTER — Encounter: Payer: Self-pay | Admitting: Family Medicine

## 2021-02-22 ENCOUNTER — Other Ambulatory Visit: Payer: Self-pay | Admitting: Family Medicine

## 2021-02-22 NOTE — Telephone Encounter (Signed)
[place]. NTBS 30 days given 01/29/21

## 2021-02-22 NOTE — Telephone Encounter (Signed)
NVM NA. Mailed letter

## 2021-02-23 MED ORDER — AMLODIPINE BESYLATE 2.5 MG PO TABS: 2.5000 mg | ORAL_TABLET | Freq: Every day | ORAL | 0 refills | Status: DC

## 2021-02-23 NOTE — Addendum Note (Signed)
Addended by: Julious Payer D on: 02/23/2021 09:30 AM   Modules accepted: Orders

## 2021-02-23 NOTE — Telephone Encounter (Signed)
Scheduled pt to see Dettiner on 03/01/21 for med refill. Pt aware.

## 2021-02-25 ENCOUNTER — Other Ambulatory Visit: Payer: Self-pay | Admitting: Family Medicine

## 2021-02-25 DIAGNOSIS — M25512 Pain in left shoulder: Secondary | ICD-10-CM

## 2021-02-25 DIAGNOSIS — G8929 Other chronic pain: Secondary | ICD-10-CM

## 2021-02-26 NOTE — Telephone Encounter (Signed)
Patient last seen 07/12/20

## 2021-03-01 ENCOUNTER — Encounter: Payer: Self-pay | Admitting: Family Medicine

## 2021-03-01 ENCOUNTER — Ambulatory Visit (INDEPENDENT_AMBULATORY_CARE_PROVIDER_SITE_OTHER): Payer: Medicare PPO | Admitting: Family Medicine

## 2021-03-01 VITALS — BP 126/74 | HR 89 | Ht 68.0 in | Wt 190.0 lb

## 2021-03-01 DIAGNOSIS — M25512 Pain in left shoulder: Secondary | ICD-10-CM

## 2021-03-01 DIAGNOSIS — E782 Mixed hyperlipidemia: Secondary | ICD-10-CM

## 2021-03-01 DIAGNOSIS — G4700 Insomnia, unspecified: Secondary | ICD-10-CM | POA: Diagnosis not present

## 2021-03-01 DIAGNOSIS — I10 Essential (primary) hypertension: Secondary | ICD-10-CM

## 2021-03-01 DIAGNOSIS — G8929 Other chronic pain: Secondary | ICD-10-CM

## 2021-03-01 DIAGNOSIS — R7303 Prediabetes: Secondary | ICD-10-CM

## 2021-03-01 LAB — BAYER DCA HB A1C WAIVED: HB A1C (BAYER DCA - WAIVED): 6.1 % — ABNORMAL HIGH (ref 4.8–5.6)

## 2021-03-01 MED ORDER — ZOLPIDEM TARTRATE 5 MG PO TABS: 5.0000 mg | ORAL_TABLET | Freq: Every evening | ORAL | 5 refills | Status: DC | PRN

## 2021-03-01 MED ORDER — NAPROXEN 375 MG PO TABS: ORAL_TABLET | ORAL | 3 refills | Status: DC

## 2021-03-01 MED ORDER — AMLODIPINE BESYLATE 2.5 MG PO TABS: 2.5000 mg | ORAL_TABLET | Freq: Every day | ORAL | 3 refills | Status: DC

## 2021-03-01 NOTE — Addendum Note (Signed)
Addended by: Brynda Peon F on: 03/01/2021 10:20 AM   Modules accepted: Orders

## 2021-03-01 NOTE — Progress Notes (Signed)
BP 126/74    Pulse 89    Ht 5\' 8"  (1.727 m)    Wt 190 lb (86.2 kg)    SpO2 97%    BMI 28.89 kg/m    Subjective:   Patient ID: Terry Winters, male    DOB: [birth year], 87 y.o.   MRN: [id number]  HPI: Terry Winters is a 87 y.o. male presenting on 03/01/2021 for Medication Refill (Amlodipine, Naproxen, Ambien)   HPI Insomnia Current rx-Ambien 5 mg nightly as needed # meds rx- 30 Effectiveness of current meds-works well, but often times takes half a tablet Adverse reactions form meds-none  Pill count performed-No Last drug screen -07/21/2020 ( high risk q34m, moderate risk q66m, low risk yearly ) Urine drug screen today- No Was the [place] reviewed- yes  If yes were their any concerning findings? - none  No flowsheet data found.   Controlled substance contract signed on: 07/21/20  Hypertension Patient is currently on amlodipine, and their blood pressure today is 126/74. Patient denies any lightheadedness or dizziness. Patient denies headaches, blurred vision, chest pains, shortness of breath, or weakness. Denies any side effects from medication and is content with current medication.   Hyperlipidemia Patient is coming in for recheck of his hyperlipidemia. The patient is currently taking lipitor and fish oil. They deny any issues with myalgias or history of liver damage from it. They deny any focal numbness or weakness or chest pain.   Prediabetes  patient comes in today for recheck of his diabetes. Patient has been currently taking no medication currently, is diet controlled. Patient is not currently on an ACE inhibitor/ARB. Patient has not seen an ophthalmologist this year. Patient denies any issues with their feet. The symptom started onset as an adult hypertension and hyperlipidemia ARE RELATED TO DM    Relevant past medical, surgical, family and social history reviewed and updated as indicated. Interim medical history since our last visit reviewed. Allergies and medications reviewed  and updated.  Review of Systems  Constitutional:  Negative for chills and fever.  Eyes:  Negative for visual disturbance.  Respiratory:  Negative for shortness of breath and wheezing.   Cardiovascular:  Negative for chest pain and leg swelling.  Musculoskeletal:  Negative for back pain and gait problem.  Skin:  Negative for rash.  Neurological:  Negative for dizziness, weakness and light-headedness.  All other systems reviewed and are negative.  Per HPI unless specifically indicated above   Allergies as of 03/01/2021       Reactions   Amoxicillin Rash   Prednisone Rash   Sulfa Antibiotics Other (See Comments)   "upset stomach"   Triamcinolone Rash        Medication List        Accurate as of March 01, 2021 10:10 AM. If you have any questions, ask your nurse or doctor.          amLODipine 2.5 MG tablet Commonly known as: NORVASC Take 1 tablet (2.5 mg total) by mouth daily.   aspirin 81 MG tablet Take 81 mg by mouth every morning.   atorvastatin 20 MG tablet Commonly known as: LIPITOR TAKE 1 TABLET EVERY DAY AT 6 PM   finasteride 5 MG tablet Commonly known as: PROSCAR TAKE 1 TABLET BY MOUTH EVERY DAY IN THE MORNING   fish oil-omega-3 fatty acids 1000 MG capsule Take 1 g by mouth daily. 5 days per week   fluticasone 50 MCG/ACT nasal spray Commonly known as: FLONASE PLACE 2 SPRAYS INTO BOTH  NOSTRILS AS NEEDED FOR ALLERGIES OR RHINITIS.   naproxen 375 MG tablet Commonly known as: NAPROSYN TAKE 1 TABLET (375 MG TOTAL) 2 (TWO) TIMES DAILY WITH A MEAL. What changed: See the new instructions. Changed by: Fransisca Kaufmann Cochise Dinneen, MD   ZINC PO Take 1 tablet by mouth once a week.   zolpidem 5 MG tablet Commonly known as: AMBIEN Take 1 tablet (5 mg total) by mouth at bedtime as needed for sleep.         Objective:   BP 126/74    Pulse 89    Ht 5\' 8"  (1.727 m)    Wt 190 lb (86.2 kg)    SpO2 97%    BMI 28.89 kg/m   Wt Readings from Last 3 Encounters:   03/01/21 190 lb (86.2 kg)  01/26/21 193 lb 2 oz (87.6 kg)  07/19/20 190 lb (86.2 kg)    Physical Exam Vitals and nursing note reviewed.  Constitutional:      General: He is not in acute distress.    Appearance: He is well-developed. He is not diaphoretic.  Eyes:     General: No scleral icterus.    Conjunctiva/sclera: Conjunctivae normal.  Neck:     Thyroid: No thyromegaly.  Cardiovascular:     Rate and Rhythm: Normal rate and regular rhythm.     Heart sounds: Normal heart sounds. No murmur heard. Pulmonary:     Effort: Pulmonary effort is normal. No respiratory distress.     Breath sounds: Normal breath sounds. No wheezing.  Musculoskeletal:        General: No swelling. Normal range of motion.     Cervical back: Neck supple.  Lymphadenopathy:     Cervical: No cervical adenopathy.  Skin:    General: Skin is warm and dry.     Findings: No rash.  Neurological:     Mental Status: He is alert and oriented to person, place, and time.     Coordination: Coordination normal.  Psychiatric:        Behavior: Behavior normal.      Assessment & Plan:   Problem List Items Addressed This Visit       Cardiovascular and Mediastinum   HTN (hypertension) - Primary   Relevant Medications   amLODipine (NORVASC) 2.5 MG tablet     Other   HLD (hyperlipidemia)   Relevant Medications   amLODipine (NORVASC) 2.5 MG tablet   Prediabetes   Insomnia   Relevant Medications   zolpidem (AMBIEN) 5 MG tablet   Chronic left shoulder pain   Relevant Medications   naproxen (NAPROSYN) 375 MG tablet    Continue current medicine, will do refills, follow-up in 6 months.    Follow up plan: Return in about 6 months (around 08/29/2021), or if symptoms worsen or fail to improve, for Hypertension.  Counseling provided for all of the vaccine components No orders of the defined types were placed in this encounter.   Caryl Pina, MD [facility] 03/01/2021, 10:10  AM

## 2021-03-02 LAB — CMP14+EGFR
ALT: 21 IU/L (ref 0–44)
AST: 22 IU/L (ref 0–40)
Albumin/Globulin Ratio: 1.9 (ref 1.2–2.2)
Albumin: 4.4 g/dL (ref 3.6–4.6)
Alkaline Phosphatase: 101 IU/L (ref 44–121)
BUN/Creatinine Ratio: 17 (ref 10–24)
BUN: 17 mg/dL (ref 8–27)
Bilirubin Total: 0.6 mg/dL (ref 0.0–1.2)
CO2: 24 mmol/L (ref 20–29)
Calcium: 9.4 mg/dL (ref 8.6–10.2)
Chloride: 100 mmol/L (ref 96–106)
Creatinine, Ser: 1.02 mg/dL (ref 0.76–1.27)
Globulin, Total: 2.3 g/dL (ref 1.5–4.5)
Glucose: 127 mg/dL — ABNORMAL HIGH (ref 70–99)
Potassium: 4.7 mmol/L (ref 3.5–5.2)
Sodium: 138 mmol/L (ref 134–144)
Total Protein: 6.7 g/dL (ref 6.0–8.5)
eGFR: 72 mL/min/{1.73_m2} (ref >59)

## 2021-03-02 LAB — LIPID PANEL
Chol/HDL Ratio: 3.3 ratio (ref 0.0–5.0)
Cholesterol, Total: 131 mg/dL (ref 100–199)
HDL: 40 mg/dL (ref >39)
LDL Chol Calc (NIH): 73 mg/dL (ref 0–99)
Triglycerides: 93 mg/dL (ref 0–149)
VLDL Cholesterol Cal: 18 mg/dL (ref 5–40)

## 2021-03-02 LAB — CBC WITH DIFFERENTIAL/PLATELET
Basophils Absolute: 0 10*3/uL (ref 0.0–0.2)
Basos: 0 %
EOS (ABSOLUTE): 0.2 10*3/uL (ref 0.0–0.4)
Eos: 2 %
Hematocrit: 48.5 % (ref 37.5–51.0)
Hemoglobin: 16.2 g/dL (ref 13.0–17.7)
Immature Grans (Abs): 0.1 10*3/uL (ref 0.0–0.1)
Immature Granulocytes: 1 %
Lymphocytes Absolute: 2.8 10*3/uL (ref 0.7–3.1)
Lymphs: 25 %
MCH: 28.6 pg (ref 26.6–33.0)
MCHC: 33.4 g/dL (ref 31.5–35.7)
MCV: 86 fL (ref 79–97)
Monocytes Absolute: 1 10*3/uL — ABNORMAL HIGH (ref 0.1–0.9)
Monocytes: 9 %
Neutrophils Absolute: 7 10*3/uL (ref 1.4–7.0)
Neutrophils: 63 %
Platelets: 276 10*3/uL (ref 150–450)
RBC: 5.67 x10E6/uL (ref 4.14–5.80)
RDW: 12.2 % (ref 11.6–15.4)
WBC: 11 10*3/uL — ABNORMAL HIGH (ref 3.4–10.8)

## 2021-03-21 ENCOUNTER — Other Ambulatory Visit: Payer: Self-pay | Admitting: Family Medicine

## 2021-07-20 ENCOUNTER — Ambulatory Visit (INDEPENDENT_AMBULATORY_CARE_PROVIDER_SITE_OTHER): Payer: Medicare PPO

## 2021-07-20 VITALS — Wt 190.0 lb

## 2021-07-20 DIAGNOSIS — Z0000 Encounter for general adult medical examination without abnormal findings: Secondary | ICD-10-CM

## 2021-07-20 NOTE — Progress Notes (Signed)
Subjective:   Terry Winters is a 87 y.o. male who presents for Medicare Annual/Subsequent preventive examination.  Virtual Visit via Telephone Note  I connected with  Terry Winters on 07/20/21 at  8:15 AM EDT by telephone and verified that I am speaking with the correct person using two identifiers.  Location: Patient: Home Provider: [facility] Persons participating in the virtual visit: patient/Nurse Health Advisor   I discussed the limitations, risks, security and privacy concerns of performing an evaluation and management service by telephone and the availability of in person appointments. The patient expressed understanding and agreed to proceed.  Interactive audio and video telecommunications were attempted between this nurse and patient, however failed, due to patient having technical difficulties OR patient did not have access to video capability.  We continued and completed visit with audio only.  Some vital signs may be absent or patient reported.   Eliodoro Gullett E Calleigh Lafontant, LPN   Review of Systems     Cardiac Risk Factors include: advanced age (>13men, >77 women);male gender;dyslipidemia;hypertension     Objective:    Today's Vitals   07/20/21 0816 07/20/21 0817  Weight: 190 lb (86.2 kg)   PainSc:  3    Body mass index is 28.89 kg/m.     07/20/2021    8:23 AM 07/19/2020   10:15 AM 10/16/2017    2:26 PM 11/14/2016   11:15 AM 11/10/2014    1:05 PM  Advanced Directives  Does Patient Have a Medical Advance Directive? No No No No No  Would patient like information on creating a medical advance directive? No - Patient declined No - Patient declined No - Patient declined No - Patient declined No - patient declined information    Current Medications (verified) Outpatient Encounter Medications as of 07/20/2021  Medication Sig   amLODipine (NORVASC) 2.5 MG tablet TAKE 1 TABLET BY MOUTH EVERY DAY   atorvastatin (LIPITOR) 20 MG tablet TAKE 1 TABLET EVERY DAY AT 6 PM   finasteride (PROSCAR) 5  MG tablet TAKE 1 TABLET BY MOUTH EVERY DAY IN THE MORNING   fish oil-omega-3 fatty acids 1000 MG capsule Take 1 g by mouth daily. 5 days per week   fluticasone (FLONASE) 50 MCG/ACT nasal spray PLACE 2 SPRAYS INTO BOTH NOSTRILS AS NEEDED FOR ALLERGIES OR RHINITIS.   Multiple Vitamins-Minerals (ZINC PO) Take 1 tablet by mouth once a week.    naproxen (NAPROSYN) 375 MG tablet TAKE 1 TABLET (375 MG TOTAL) 2 (TWO) TIMES DAILY WITH A MEAL.   zolpidem (AMBIEN) 5 MG tablet Take 1 tablet (5 mg total) by mouth at bedtime as needed for sleep.   aspirin 81 MG tablet Take 81 mg by mouth every morning.  (Patient not taking: Reported on 01/26/2021)   No facility-administered encounter medications on file as of 07/20/2021.    Allergies (verified) Amoxicillin, Prednisone, Sulfa antibiotics, and Triamcinolone   History: Past Medical History:  Diagnosis Date   BPH (benign prostatic hypertrophy)    Foley catheter in place    GERD (gastroesophageal reflux disease)    watches diet   History of gout    as of 11-12-2016  per pt stable   Hyperlipidemia    Hypertension    per pt stopped his medication losartan on his own--- last taken 2nd week sept. 2018--- per pt checks bp and has been ok and watches diet   Seasonal allergies    Type 2 diabetes, diet controlled (HCC)    Urinary retention    Vitamin D  deficiency    Wears glasses    Wears partial dentures    upper   Past Surgical History:  Procedure Laterality Date   ANTERIOR CERVICAL DECOMP/DISCECTOMY FUSION  1993   TONSILLECTOMY  child   TRANSURETHRAL RESECTION OF PROSTATE N/A 11/14/2016   Procedure: TRANSURETHRAL RESECTION OF THE PROSTATE (TURP);  Surgeon: Bjorn Pippin, MD;  Location: [facility];  Service: Urology;  Laterality: N/A;   Family History  Problem Relation Age of Onset   ALS Father    Social History   Socioeconomic History   Marital status: Married    Spouse name: Terry Winters   Number of children: 3   Years of education:  Not on file   Highest education level: Not on file  Occupational History   Occupation: retired  Tobacco Use   Smoking status: Former    Packs/day: 0.50    Years: 15.00    Total pack years: 7.50    Types: Cigars, Cigarettes    Quit date: 06/16/1966    Years since quitting: 55.1   Smokeless tobacco: Never  Vaping Use   Vaping Use: Never used  Substance and Sexual Activity   Alcohol use: Yes    Comment: occasionally   Drug use: No   Sexual activity: Not on file  Other Topics Concern   Not on file  Social History Narrative   07/20/2021 - Lives home with wife. Enjoys playing golf. Cuts hay, mows, lots of yard work, very active   Teenage grandsons stay with them a lot of weekends   Social Determinants of Health   Financial Resource Strain: Low Risk  (07/20/2021)   Overall Financial Resource Strain (CARDIA)    Difficulty of Paying Living Expenses: Not hard at all  Food Insecurity: No Food Insecurity (07/20/2021)   Hunger Vital Sign    Worried About Running Out of Food in the Last Year: Never true    Ran Out of Food in the Last Year: Never true  Transportation Needs: No Transportation Needs (07/20/2021)   PRAPARE - Administrator, Civil Service (Medical): No    Lack of Transportation (Non-Medical): No  Physical Activity: Insufficiently Active (07/20/2021)   Exercise Vital Sign    Days of Exercise per Week: 7 days    Minutes of Exercise per Session: 20 min  Stress: No Stress Concern Present (07/20/2021)   [employer] of Occupational Health - Occupational Stress Questionnaire    Feeling of Stress : Not at all  Social Connections: Socially Integrated (07/20/2021)   Social Connection and Isolation Panel [NHANES]    Frequency of Communication with Friends and Family: More than three times a week    Frequency of Social Gatherings with Friends and Family: More than three times a week    Attends Religious Services: 1 to 4 times per year    Active Member of [employer] or Organizations:  Yes    Attends Banker Meetings: 1 to 4 times per year    Marital Status: Married    Tobacco Counseling Counseling given: Not Answered   Clinical Intake:  Pre-visit preparation completed: Yes  Pain : 0-10 Pain Score: 3  Pain Type: Chronic pain Pain Location: Knee Pain Orientation: Right, Left Pain Descriptors / Indicators: Aching, Sore, Discomfort Pain Onset: More than a month ago Pain Frequency: Intermittent     BMI - recorded: 28.89 Nutritional Status: BMI 25 -29 Overweight Nutritional Risks: None Diabetes: No  How often do you need to have someone help you  when you read instructions, pamphlets, or other written materials from your doctor or pharmacy?: 1 - Never  Diabetic? no  Interpreter Needed?: No  Information entered by :: Karsen Nakanishi, LPN   Activities of Daily Living    07/20/2021    8:23 AM  In your present state of health, do you have any difficulty performing the following activities:  Hearing? 0  Vision? 0  Difficulty concentrating or making decisions? 0  Walking or climbing stairs? 0  Dressing or bathing? 0  Doing errands, shopping? 0  Preparing Food and eating ? N  Using the Toilet? N  In the past six months, have you accidently leaked urine? N  Do you have problems with loss of bowel control? N  Managing your Medications? N  Managing your Finances? N  Housekeeping or managing your Housekeeping? N    Patient Care Team: Dettinger, Elige Radon, MD as PCP - General (Family Medicine) Bjorn Pippin, MD as Attending Physician (Urology) Dione Booze, Bertram Millard, MD as Consulting Physician (Ophthalmology)  Indicate any recent Medical Services you may have received from other than [facility] providers in the past year (date may be approximate).     Assessment:   This is a routine wellness examination for Terry Winters.  Hearing/Vision screen Hearing Screening - Comments:: Denies hearing difficulties   Vision Screening - Comments:: Wears rx glasses - up  to date with routine eye exams with [facility]  Dietary issues and exercise activities discussed: Current Exercise Habits: Home exercise routine, Type of exercise: walking;Other - see comments (yard work), Time (Minutes): 15, Frequency (Times/Week): 7, Weekly Exercise (Minutes/Week): 105, Intensity: Mild, Exercise limited by: orthopedic condition(s)   Goals Addressed             This Visit's Progress    DIET - EAT MORE FRUITS AND VEGETABLES   On track    DIET - INCREASE WATER INTAKE   On track      Depression Screen    07/20/2021    8:24 AM 03/01/2021    9:33 AM 01/26/2021    4:27 PM 07/19/2020   10:03 AM 07/12/2020    1:05 PM 12/21/2019    9:10 AM 07/02/2019    2:35 PM  PHQ 2/9 Scores  PHQ - 2 Score 0 0 0 0 0 1 0  PHQ- 9 Score  0 0        Fall Risk    07/20/2021    8:18 AM 03/01/2021    9:33 AM 01/26/2021    4:27 PM 07/19/2020   10:15 AM 07/12/2020    1:08 PM  Fall Risk   Falls in the past year? 0 0 0 0 1  Number falls in past yr: 0   0 0  Injury with Fall? 0   0 0  Risk for fall due to : Orthopedic patient   Impaired vision;Orthopedic patient History of fall(s)  Follow up Falls prevention discussed   Falls prevention discussed Falls evaluation completed    FALL RISK PREVENTION PERTAINING TO THE HOME:  Any stairs in or around the home? Yes  If so, are there any without handrails? No  Home free of loose throw rugs in walkways, pet beds, electrical cords, etc? Yes  Adequate lighting in your home to reduce risk of falls? Yes   ASSISTIVE DEVICES UTILIZED TO PREVENT FALLS:  Life alert? No  Use of a cane, walker or w/c? No  Grab bars in the bathroom? Yes  Shower chair or bench in shower? No  Elevated toilet seat or a handicapped toilet? Yes   TIMED UP AND GO:  Was the test performed? No . Telephonic visit  Cognitive Function:    10/16/2017    2:29 PM 11/10/2014    1:09 PM  MMSE - Mini Mental State Exam  Orientation to time 5 5  Orientation to Place 5 5  Registration 3 3   Attention/ Calculation 5 5  Recall 3 3  Language- name 2 objects 2 2  Language- repeat 1 1  Language- follow 3 step command 3 3  Language- read & follow direction 1 1  Write a sentence 1 1  Copy design 1 1  Total score 30 30        07/20/2021    8:26 AM 07/19/2020   10:07 AM  6CIT Screen  What Year? 0 points 0 points  What month? 0 points 0 points  What time? 0 points 0 points  Count back from 20 0 points 0 points  Months in reverse 0 points 0 points  Repeat phrase 0 points 0 points  Total Score 0 points 0 points    Immunizations Immunization History  Administered Date(s) Administered   Fluad Quad(high Dose 65+) 11/13/2020   Influenza, High Dose Seasonal PF 12/17/2016, 12/01/2017, 12/09/2018, 12/08/2019   Influenza,inj,Quad PF,6+ Mos 12/30/2013, 11/10/2014, 01/08/2016   Moderna Sars-Covid-2 Vaccination 03/10/2019, 04/07/2019, 10/28/2019   Pneumococcal Conjugate-13 01/08/2016   Pneumococcal Polysaccharide-23 07/16/2013   Tdap 12/21/2019    TDAP status: Up to date  Flu Vaccine status: Up to date  Pneumococcal vaccine status: Up to date  Covid-19 vaccine status: Completed vaccines  Qualifies for Shingles Vaccine? Yes   Zostavax completed No   Shingrix Completed?: No.    Education has been provided regarding the importance of this vaccine. Patient has been advised to call insurance company to determine out of pocket expense if they have not yet received this vaccine. Advised may also receive vaccine at local pharmacy or Health Dept. Verbalized acceptance and understanding.  Screening Tests Health Maintenance  Topic Date Due   Zoster Vaccines- Shingrix (1 of 2) Never done   COVID-19 Vaccine (4 - Booster for Moderna series) 12/23/2019   INFLUENZA VACCINE  09/11/2021   TETANUS/TDAP  12/20/2029   Pneumonia Vaccine 3765+ Years old  Completed   HPV VACCINES  Aged Out    Health Maintenance  Health Maintenance Due  Topic Date Due   Zoster Vaccines- Shingrix (1 of 2)  Never done   COVID-19 Vaccine (4 - Booster for Moderna series) 12/23/2019    Colorectal cancer screening: No longer required.   Lung Cancer Screening: (Low Dose CT Chest recommended if Age 98-80 years, 30 pack-year currently smoking OR have quit w/in 15years.) does not qualify.   Additional Screening:  Hepatitis C Screening: does not qualify  Vision Screening: Recommended annual ophthalmology exams for early detection of glaucoma and other disorders of the eye. Is the patient up to date with their annual eye exam?  Yes  Who is the provider or what is the name of the office in which the patient attends annual eye exams? [facility] If pt is not established with a provider, would they like to be referred to a provider to establish care? No .   Dental Screening: Recommended annual dental exams for proper oral hygiene  Community Resource Referral / Chronic Care Management: CRR required this visit?  No   CCM required this visit?  No      Plan:  I have personally reviewed and noted the following in the patient's chart:   Medical and social history Use of alcohol, tobacco or illicit drugs  Current medications and supplements including opioid prescriptions. Patient is not currently taking opioid prescriptions. Functional ability and status Nutritional status Physical activity Advanced directives List of other physicians Hospitalizations, surgeries, and ER visits in previous 12 months Vitals Screenings to include cognitive, depression, and falls Referrals and appointments  In addition, I have reviewed and discussed with patient certain preventive protocols, quality metrics, and best practice recommendations. A written personalized care plan for preventive services as well as general preventive health recommendations were provided to patient.     [place] Constable, LPN   2/0/2542   Nurse Notes: None

## 2021-07-20 NOTE — Patient Instructions (Signed)
Terry Winters , Thank you for taking time to come for your Medicare Wellness Visit. I appreciate your ongoing commitment to your health goals. Please review the following plan we discussed and let me know if I can assist you in the future.   Screening recommendations/referrals: Colonoscopy: No longer required Recommended yearly ophthalmology/optometry visit for glaucoma screening and checkup Recommended yearly dental visit for hygiene and checkup  Vaccinations: Influenza vaccine: Done 11/13/2020 - Repeat annually Pneumococcal vaccine: Done  07/16/2013 & 01/08/2016 Tdap vaccine: Done 12/21/2019 - Repeat in 10 years Shingles vaccine: Due - Shingrix is 2 doses 2-6 months apart and over 90% effective  *get at next visit   Covid-19: Done 03/10/2019, 04/07/2019, & 10/28/2019  Advanced directives: Advance directive discussed with you today. Even though you declined this today, please call our office should you change your mind, and we can give you the proper paperwork for you to fill out.   Conditions/risks identified: Aim for 30 minutes of exercise or brisk walking, 6-8 glasses of water, and 5 servings of fruits and vegetables each day.   Next appointment: Follow up in one year for your annual wellness visit.   Preventive Care 18 Years and Older, Male  Preventive care refers to lifestyle choices and visits with your health care provider that can promote health and wellness. What does preventive care include? A yearly physical exam. This is also called an annual well check. Dental exams once or twice a year. Routine eye exams. Ask your health care provider how often you should have your eyes checked. Personal lifestyle choices, including: Daily care of your teeth and gums. Regular physical activity. Eating a healthy diet. Avoiding tobacco and drug use. Limiting alcohol use. Practicing safe sex. Taking low doses of aspirin every day. Taking vitamin and mineral supplements as recommended by your  health care provider. What happens during an annual well check? The services and screenings done by your health care provider during your annual well check will depend on your age, overall health, lifestyle risk factors, and family history of disease. Counseling  Your health care provider may ask you questions about your: Alcohol use. Tobacco use. Drug use. Emotional well-being. Home and relationship well-being. Sexual activity. Eating habits. History of falls. Memory and ability to understand (cognition). Work and work Astronomer. Screening  You may have the following tests or measurements: Height, weight, and BMI. Blood pressure. Lipid and cholesterol levels. These may be checked every 5 years, or more frequently if you are over 49 years old. Skin check. Lung cancer screening. You may have this screening every year starting at age 81 if you have a 30-pack-year history of smoking and currently smoke or have quit within the past 15 years. Fecal occult blood test (FOBT) of the stool. You may have this test every year starting at age 13. Flexible sigmoidoscopy or colonoscopy. You may have a sigmoidoscopy every 5 years or a colonoscopy every 10 years starting at age 14. Prostate cancer screening. Recommendations will vary depending on your family history and other risks. Hepatitis C blood test. Hepatitis B blood test. Sexually transmitted disease (STD) testing. Diabetes screening. This is done by checking your blood sugar (glucose) after you have not eaten for a while (fasting). You may have this done every 1-3 years. Abdominal aortic aneurysm (AAA) screening. You may need this if you are a current or former smoker. Osteoporosis. You may be screened starting at age 60 if you are at high risk. Talk with your health care provider  about your test results, treatment options, and if necessary, the need for more tests. Vaccines  Your health care provider may recommend certain vaccines, such  as: Influenza vaccine. This is recommended every year. Tetanus, diphtheria, and acellular pertussis (Tdap, Td) vaccine. You may need a Td booster every 10 years. Zoster vaccine. You may need this after age 3. Pneumococcal 13-valent conjugate (PCV13) vaccine. One dose is recommended after age 13. Pneumococcal polysaccharide (PPSV23) vaccine. One dose is recommended after age 82. Talk to your health care provider about which screenings and vaccines you need and how often you need them. This information is not intended to replace advice given to you by your health care provider. Make sure you discuss any questions you have with your health care provider. Document Released: 02/24/2015 Document Revised: 10/18/2015 Document Reviewed: 11/29/2014 Elsevier Interactive Patient Education  2017 [place] Prevention in the Home Falls can cause injuries. They can happen to people of all ages. There are many things you can do to make your home safe and to help prevent falls. What can I do on the outside of my home? Regularly fix the edges of walkways and driveways and fix any cracks. Remove anything that might make you trip as you walk through a door, such as a raised step or threshold. Trim any bushes or trees on the path to your home. Use bright outdoor lighting. Clear any walking paths of anything that might make someone trip, such as rocks or tools. Regularly check to see if handrails are loose or broken. Make sure that both sides of any steps have handrails. Any raised decks and porches should have guardrails on the edges. Have any leaves, snow, or ice cleared regularly. Use sand or salt on walking paths during winter. Clean up any spills in your garage right away. This includes oil or grease spills. What can I do in the bathroom? Use night lights. Install grab bars by the toilet and in the tub and shower. Do not use towel bars as grab bars. Use non-skid mats or decals in the tub or  shower. If you need to sit down in the shower, use a plastic, non-slip stool. Keep the floor dry. Clean up any water that spills on the floor as soon as it happens. Remove soap buildup in the tub or shower regularly. Attach bath mats securely with double-sided non-slip rug tape. Do not have throw rugs and other things on the floor that can make you trip. What can I do in the bedroom? Use night lights. Make sure that you have a light by your bed that is easy to reach. Do not use any sheets or blankets that are too big for your bed. They should not hang down onto the floor. Have a firm chair that has side arms. You can use this for support while you get dressed. Do not have throw rugs and other things on the floor that can make you trip. What can I do in the kitchen? Clean up any spills right away. Avoid walking on wet floors. Keep items that you use a lot in easy-to-reach places. If you need to reach something above you, use a strong step stool that has a grab bar. Keep electrical cords out of the way. Do not use floor polish or wax that makes floors slippery. If you must use wax, use non-skid floor wax. Do not have throw rugs and other things on the floor that can make you trip. What can I do  with my stairs? Do not leave any items on the stairs. Make sure that there are handrails on both sides of the stairs and use them. Fix handrails that are broken or loose. Make sure that handrails are as long as the stairways. Check any carpeting to make sure that it is firmly attached to the stairs. Fix any carpet that is loose or worn. Avoid having throw rugs at the top or bottom of the stairs. If you do have throw rugs, attach them to the floor with carpet tape. Make sure that you have a light switch at the top of the stairs and the bottom of the stairs. If you do not have them, ask someone to add them for you. What else can I do to help prevent falls? Wear shoes that: Do not have high heels. Have  rubber bottoms. Are comfortable and fit you well. Are closed at the toe. Do not wear sandals. If you use a stepladder: Make sure that it is fully opened. Do not climb a closed stepladder. Make sure that both sides of the stepladder are locked into place. Ask someone to hold it for you, if possible. Clearly mark and make sure that you can see: Any grab bars or handrails. First and last steps. Where the edge of each step is. Use tools that help you move around (mobility aids) if they are needed. These include: Canes. Walkers. Scooters. Crutches. Turn on the lights when you go into a dark area. Replace any light bulbs as soon as they burn out. Set up your furniture so you have a clear path. Avoid moving your furniture around. If any of your floors are uneven, fix them. If there are any pets around you, be aware of where they are. Review your medicines with your doctor. Some medicines can make you feel dizzy. This can increase your chance of falling. Ask your doctor what other things that you can do to help prevent falls. This information is not intended to replace advice given to you by your health care provider. Make sure you discuss any questions you have with your health care provider. Document Released: 11/24/2008 Document Revised: 07/06/2015 Document Reviewed: 03/04/2014 Elsevier Interactive Patient Education  2017 [employer].

## 2021-08-07 ENCOUNTER — Other Ambulatory Visit: Payer: Self-pay | Admitting: Family Medicine

## 2021-08-27 ENCOUNTER — Other Ambulatory Visit: Payer: Self-pay | Admitting: Family Medicine

## 2021-08-27 DIAGNOSIS — E782 Mixed hyperlipidemia: Secondary | ICD-10-CM

## 2021-08-29 ENCOUNTER — Encounter: Payer: Self-pay | Admitting: Family Medicine

## 2021-08-29 ENCOUNTER — Ambulatory Visit (INDEPENDENT_AMBULATORY_CARE_PROVIDER_SITE_OTHER): Payer: Medicare PPO | Admitting: Family Medicine

## 2021-08-29 VITALS — BP 136/79 | HR 77 | Temp 98.0°F | Ht 68.0 in | Wt 192.0 lb

## 2021-08-29 DIAGNOSIS — I10 Essential (primary) hypertension: Secondary | ICD-10-CM | POA: Diagnosis not present

## 2021-08-29 DIAGNOSIS — R7303 Prediabetes: Secondary | ICD-10-CM | POA: Diagnosis not present

## 2021-08-29 DIAGNOSIS — Z79899 Other long term (current) drug therapy: Secondary | ICD-10-CM | POA: Diagnosis not present

## 2021-08-29 DIAGNOSIS — G4700 Insomnia, unspecified: Secondary | ICD-10-CM | POA: Diagnosis not present

## 2021-08-29 DIAGNOSIS — E782 Mixed hyperlipidemia: Secondary | ICD-10-CM

## 2021-08-29 DIAGNOSIS — R3912 Poor urinary stream: Secondary | ICD-10-CM | POA: Diagnosis not present

## 2021-08-29 DIAGNOSIS — N401 Enlarged prostate with lower urinary tract symptoms: Secondary | ICD-10-CM

## 2021-08-29 DIAGNOSIS — Z23 Encounter for immunization: Secondary | ICD-10-CM

## 2021-08-29 LAB — BAYER DCA HB A1C WAIVED: HB A1C (BAYER DCA - WAIVED): 5.9 % — ABNORMAL HIGH (ref 4.8–5.6)

## 2021-08-29 MED ORDER — ATORVASTATIN CALCIUM 20 MG PO TABS: ORAL_TABLET | ORAL | 3 refills | Status: DC

## 2021-08-29 MED ORDER — AMLODIPINE BESYLATE 2.5 MG PO TABS: 2.5000 mg | ORAL_TABLET | Freq: Every day | ORAL | 3 refills | Status: DC

## 2021-08-29 MED ORDER — ZOLPIDEM TARTRATE 5 MG PO TABS: 5.0000 mg | ORAL_TABLET | Freq: Every evening | ORAL | 5 refills | Status: DC | PRN

## 2021-08-29 MED ORDER — FINASTERIDE 5 MG PO TABS: ORAL_TABLET | ORAL | 3 refills | Status: DC

## 2021-08-29 NOTE — Progress Notes (Signed)
BP 136/79   Pulse 77   Temp 98 F (36.7 C)   Ht '5\' 8"'  (1.727 m)   Wt 192 lb (87.1 kg)   SpO2 97%   BMI 29.19 kg/m    Subjective:   Patient ID: Terry Winters, male    DOB: 10/09/1934, 87 y.o.   MRN: [id number]  HPI: Terry Winters is a 87 y.o. male presenting on 08/29/2021 for Medical Management of Chronic Issues and Hypertension   HPI Hypertension Patient is currently on amlodipine, and their blood pressure today is 136/79. Patient denies any lightheadedness or dizziness. Patient denies headaches, blurred vision, chest pains, shortness of breath, or weakness. Denies any side effects from medication and is content with current medication.   Hyperlipidemia Patient is coming in for recheck of his hyperlipidemia. The patient is currently taking atorvastatin. They deny any issues with myalgias or history of liver damage from it. They deny any focal numbness or weakness or chest pain.   Prediabetes Patient comes in today for recheck of his diabetes. Patient has been currently taking no medicine currently. Patient is not currently on an ACE inhibitor/ARB. Patient has not seen an ophthalmologist this year. Patient denies any issues with their feet. The symptom started onset as an adult htn and hld ARE RELATED TO DM   Insomnia recheck Current rx- ambien # meds rx- 30 Effectiveness of current meds-works well Adverse reactions form meds-none  Pill count performed-No Last drug screen -1622 ( high risk q15m moderate risk q625mlow risk yearly ) Urine drug screen today- Yes [place]   If yes were their any concerning findings? -None Controlled substance contract signed on: Today  Relevant past medical, surgical, family and social history reviewed and updated as indicated. Interim medical history since our last visit reviewed. Allergies and medications reviewed and updated.  Review of Systems  Constitutional:  Negative for chills and fever.  Eyes:  Negative for visual  disturbance.  Respiratory:  Negative for shortness of breath and wheezing.   Cardiovascular:  Negative for chest pain and leg swelling.  Musculoskeletal:  Negative for back pain and gait problem.  Skin:  Negative for rash.  Neurological:  Negative for dizziness, weakness and light-headedness.  All other systems reviewed and are negative.   Per HPI unless specifically indicated above   Allergies as of 08/29/2021       Reactions   Amoxicillin Rash   Prednisone Rash   Sulfa Antibiotics Other (See Comments)   "upset stomach"   Triamcinolone Rash        Medication List        Accurate as of August 29, 2021 11:02 AM. If you have any questions, ask your nurse or doctor.          amLODipine 2.5 MG tablet Commonly known as: NORVASC Take 1 tablet (2.5 mg total) by mouth daily.   aspirin 81 MG tablet Take 81 mg by mouth every morning.   atorvastatin 20 MG tablet Commonly known as: LIPITOR TAKE 1 TABLET EVERY DAY AT 6 PM   finasteride 5 MG tablet Commonly known as: PROSCAR TAKE 1 TABLET BY MOUTH EVERY DAY IN THE MORNING   fish oil-omega-3 fatty acids 1000 MG capsule Take 1 g by mouth daily. 5 days per week   fluticasone 50 MCG/ACT nasal spray Commonly known as: FLONASE PLACE 2 SPRAYS INTO BOTH NOSTRILS AS NEEDED FOR ALLERGIES OR RHINITIS.   naproxen 375 MG tablet Commonly known as: NAPROSYN TAKE 1 TABLET (375  MG TOTAL) 2 (TWO) TIMES DAILY WITH A MEAL.   ZINC PO Take 1 tablet by mouth once a week.   zolpidem 5 MG tablet Commonly known as: AMBIEN Take 1 tablet (5 mg total) by mouth at bedtime as needed for sleep.         Objective:   BP 136/79   Pulse 77   Temp 98 F (36.7 C)   Ht '5\' 8"'  (1.727 m)   Wt 192 lb (87.1 kg)   SpO2 97%   BMI 29.19 kg/m   Wt Readings from Last 3 Encounters:  08/29/21 192 lb (87.1 kg)  07/20/21 190 lb (86.2 kg)  03/01/21 190 lb (86.2 kg)    Physical Exam Vitals and nursing note reviewed.  Constitutional:      General:  He is not in acute distress.    Appearance: He is well-developed. He is not diaphoretic.  Eyes:     General: No scleral icterus.    Conjunctiva/sclera: Conjunctivae normal.  Neck:     Thyroid: No thyromegaly.  Cardiovascular:     Rate and Rhythm: Normal rate and regular rhythm.     Heart sounds: Normal heart sounds. No murmur heard. Pulmonary:     Effort: Pulmonary effort is normal. No respiratory distress.     Breath sounds: Normal breath sounds. No wheezing.  Musculoskeletal:        General: No swelling. Normal range of motion.     Cervical back: Neck supple.  Lymphadenopathy:     Cervical: No cervical adenopathy.  Skin:    General: Skin is warm and dry.     Findings: No rash.  Neurological:     Mental Status: He is alert and oriented to person, place, and time.     Coordination: Coordination normal.  Psychiatric:        Behavior: Behavior normal.       Assessment & Plan:   Problem List Items Addressed This Visit       Cardiovascular and Mediastinum   HTN (hypertension)   Relevant Medications   amLODipine (NORVASC) 2.5 MG tablet   atorvastatin (LIPITOR) 20 MG tablet   Other Relevant Orders   CBC with Differential/Platelet   CMP14+EGFR   Lipid panel     Genitourinary   BPH (benign prostatic hyperplasia)   Relevant Medications   finasteride (PROSCAR) 5 MG tablet   Other Relevant Orders   PSA, total and free     Other   HLD (hyperlipidemia)   Relevant Medications   amLODipine (NORVASC) 2.5 MG tablet   atorvastatin (LIPITOR) 20 MG tablet   Other Relevant Orders   CBC with Differential/Platelet   CMP14+EGFR   Lipid panel   Prediabetes   Relevant Orders   PSA, total and free   Bayer DCA Hb A1c Waived   Insomnia   Relevant Medications   zolpidem (AMBIEN) 5 MG tablet   Other Visit Diagnoses     Controlled substance agreement signed    -  Primary   Relevant Orders   ToxASSURE Select 13 (MW), Urine   Need for shingles vaccine       Relevant Orders    Varicella-zoster vaccine IM (Shingrix)     Continue current medicine, will do blood work today.  Follow up plan: Return in about 6 months (around 03/01/2022), or if symptoms worsen or fail to improve, for Prediabetes hypertension and insomnia.  Counseling provided for all of the vaccine components Orders Placed This Encounter  Procedures   Varicella-zoster vaccine  IM (Shingrix)   CBC with Differential/Platelet   CMP14+EGFR   Lipid panel   PSA, total and free   ToxASSURE Select 13 (MW), Urine   Bayer DCA Hb A1c Waived    Caryl Pina, MD [facility] 08/29/2021, 11:02 AM

## 2021-08-30 LAB — CBC WITH DIFFERENTIAL/PLATELET
Basophils Absolute: 0.1 10*3/uL (ref 0.0–0.2)
Basos: 1 %
EOS (ABSOLUTE): 0.3 10*3/uL (ref 0.0–0.4)
Eos: 3 %
Hematocrit: 48.3 % (ref 37.5–51.0)
Hemoglobin: 16 g/dL (ref 13.0–17.7)
Immature Grans (Abs): 0 10*3/uL (ref 0.0–0.1)
Immature Granulocytes: 0 %
Lymphocytes Absolute: 2.6 10*3/uL (ref 0.7–3.1)
Lymphs: 27 %
MCH: 29 pg (ref 26.6–33.0)
MCHC: 33.1 g/dL (ref 31.5–35.7)
MCV: 88 fL (ref 79–97)
Monocytes Absolute: 0.9 10*3/uL (ref 0.1–0.9)
Monocytes: 9 %
Neutrophils Absolute: 5.7 10*3/uL (ref 1.4–7.0)
Neutrophils: 60 %
Platelets: 237 10*3/uL (ref 150–450)
RBC: 5.52 x10E6/uL (ref 4.14–5.80)
RDW: 13.1 % (ref 11.6–15.4)
WBC: 9.6 10*3/uL (ref 3.4–10.8)

## 2021-08-30 LAB — CMP14+EGFR
ALT: 19 IU/L (ref 0–44)
AST: 23 IU/L (ref 0–40)
Albumin/Globulin Ratio: 1.6 (ref 1.2–2.2)
Albumin: 4.2 g/dL (ref 3.7–4.7)
Alkaline Phosphatase: 99 IU/L (ref 44–121)
BUN/Creatinine Ratio: 13 (ref 10–24)
BUN: 13 mg/dL (ref 8–27)
Bilirubin Total: 0.6 mg/dL (ref 0.0–1.2)
CO2: 24 mmol/L (ref 20–29)
Calcium: 9.4 mg/dL (ref 8.6–10.2)
Chloride: 100 mmol/L (ref 96–106)
Creatinine, Ser: 1.03 mg/dL (ref 0.76–1.27)
Globulin, Total: 2.6 g/dL (ref 1.5–4.5)
Glucose: 113 mg/dL — ABNORMAL HIGH (ref 70–99)
Potassium: 5 mmol/L (ref 3.5–5.2)
Sodium: 138 mmol/L (ref 134–144)
Total Protein: 6.8 g/dL (ref 6.0–8.5)
eGFR: 72 mL/min/{1.73_m2} (ref >59)

## 2021-08-30 LAB — LIPID PANEL
Chol/HDL Ratio: 3 ratio (ref 0.0–5.0)
Cholesterol, Total: 137 mg/dL (ref 100–199)
HDL: 45 mg/dL (ref >39)
LDL Chol Calc (NIH): 72 mg/dL (ref 0–99)
Triglycerides: 110 mg/dL (ref 0–149)
VLDL Cholesterol Cal: 20 mg/dL (ref 5–40)

## 2021-08-30 LAB — PSA, TOTAL AND FREE
PSA, Free Pct: 32.5 %
PSA, Free: 0.13 ng/mL
Prostate Specific Ag, Serum: 0.4 ng/mL (ref 0.0–4.0)

## 2021-09-02 LAB — TOXASSURE SELECT 13 (MW), URINE

## 2021-11-28 ENCOUNTER — Ambulatory Visit (INDEPENDENT_AMBULATORY_CARE_PROVIDER_SITE_OTHER): Payer: Medicare PPO

## 2021-11-28 DIAGNOSIS — Z23 Encounter for immunization: Secondary | ICD-10-CM

## 2022-03-01 ENCOUNTER — Ambulatory Visit: Payer: Medicare PPO | Admitting: Family Medicine

## 2022-03-01 DIAGNOSIS — R7303 Prediabetes: Secondary | ICD-10-CM

## 2022-03-01 DIAGNOSIS — I10 Essential (primary) hypertension: Secondary | ICD-10-CM

## 2022-03-01 DIAGNOSIS — G4700 Insomnia, unspecified: Secondary | ICD-10-CM

## 2022-03-01 DIAGNOSIS — E782 Mixed hyperlipidemia: Secondary | ICD-10-CM

## 2022-03-06 ENCOUNTER — Encounter: Payer: Self-pay | Admitting: Family Medicine

## 2022-03-06 ENCOUNTER — Ambulatory Visit (INDEPENDENT_AMBULATORY_CARE_PROVIDER_SITE_OTHER): Payer: Medicare PPO | Admitting: Family Medicine

## 2022-03-06 VITALS — BP 152/82 | HR 79 | Ht 68.0 in | Wt 184.0 lb

## 2022-03-06 DIAGNOSIS — E782 Mixed hyperlipidemia: Secondary | ICD-10-CM

## 2022-03-06 DIAGNOSIS — Z23 Encounter for immunization: Secondary | ICD-10-CM

## 2022-03-06 DIAGNOSIS — G8929 Other chronic pain: Secondary | ICD-10-CM | POA: Diagnosis not present

## 2022-03-06 DIAGNOSIS — M25512 Pain in left shoulder: Secondary | ICD-10-CM

## 2022-03-06 DIAGNOSIS — G4700 Insomnia, unspecified: Secondary | ICD-10-CM | POA: Diagnosis not present

## 2022-03-06 DIAGNOSIS — I10 Essential (primary) hypertension: Secondary | ICD-10-CM | POA: Diagnosis not present

## 2022-03-06 DIAGNOSIS — R7303 Prediabetes: Secondary | ICD-10-CM

## 2022-03-06 LAB — BAYER DCA HB A1C WAIVED: HB A1C (BAYER DCA - WAIVED): 6.3 % — ABNORMAL HIGH (ref 4.8–5.6)

## 2022-03-06 MED ORDER — ZOLPIDEM TARTRATE 5 MG PO TABS: 5.0000 mg | ORAL_TABLET | Freq: Every evening | ORAL | 5 refills | Status: DC | PRN

## 2022-03-06 MED ORDER — NAPROXEN 375 MG PO TABS: ORAL_TABLET | ORAL | 3 refills | Status: DC

## 2022-03-06 MED ORDER — ATORVASTATIN CALCIUM 20 MG PO TABS: ORAL_TABLET | ORAL | 3 refills | Status: DC

## 2022-03-06 NOTE — Progress Notes (Signed)
BP (!) 152/82   Pulse 79   Ht 5\' 8"  (1.727 m)   Wt 184 lb (83.5 kg)   SpO2 98%   BMI 27.98 kg/m    Subjective:   Patient ID: Terry Winters, male    DOB: 11-24-1934, 88 y.o.   MRN: [id number]  HPI: Terry Winters is a 88 y.o. male presenting on 03/06/2022 for Medical Management of Chronic Issues, Hyperlipidemia, Hypertension, and Insomnia   HPI Hypertension Patient is currently on amlodipine, and their blood pressure today is 152/82. Patient denies any lightheadedness or dizziness. Patient denies headaches, blurred vision, chest pains, shortness of breath, or weakness. Denies any side effects from medication and is content with current medication.   Hyperlipidemia Patient is coming in for recheck of his hyperlipidemia. The patient is currently taking atorvastatin and fish oil. They deny any issues with myalgias or history of liver damage from it. They deny any focal numbness or weakness or chest pain.   Prediabetes Patient comes in today for recheck of his diabetes. Patient has been currently taking no medicine, diet control. Patient is not currently on an ACE inhibitor/ARB. Patient has not seen an ophthalmologist this year. Patient denies any issues with their feet. The symptom started onset as an adult hypertension and hyperlipidemia ARE RELATED TO DM   Insomnia recheck insomnia recheck insomnia recheck Ambien 5 mg nightly as needed Ambien 5 mg nightly.Insomnia recheck Current rx- Ambien 5 mg qhs prn # meds rx- 30/month Effectiveness of current meds-works well Adverse reactions form meds-none  Pill count performed-No Last drug screen - 09/06/21 ( high risk q55m, moderate risk q33m, low risk yearly ) Urine drug screen today- No Was the [place] reviewed- yes  If yes were their any concerning findings? - none  No flowsheet data found.   Controlled substance contract signed on: 09/06/21   Relevant past medical, surgical, family and social history reviewed and updated as indicated.  Interim medical history since our last visit reviewed. Allergies and medications reviewed and updated.  Review of Systems  Constitutional:  Negative for chills and fever.  Respiratory:  Negative for shortness of breath and wheezing.   Cardiovascular:  Negative for chest pain and leg swelling.  Musculoskeletal:  Negative for back pain and gait problem.  Skin:  Negative for rash.  All other systems reviewed and are negative.   Per HPI unless specifically indicated above   Allergies as of 03/06/2022       Reactions   Amoxicillin Rash   Prednisone Rash   Sulfa Antibiotics Other (See Comments)   "upset stomach"   Triamcinolone Rash        Medication List        Accurate as of March 06, 2022 10:51 AM. If you have any questions, ask your nurse or doctor.          amLODipine 2.5 MG tablet Commonly known as: NORVASC Take 1 tablet (2.5 mg total) by mouth daily.   aspirin 81 MG tablet Take 81 mg by mouth every morning.   atorvastatin 20 MG tablet Commonly known as: LIPITOR TAKE 1 TABLET EVERY DAY AT 6 PM   finasteride 5 MG tablet Commonly known as: PROSCAR TAKE 1 TABLET BY MOUTH EVERY DAY IN THE MORNING   fish oil-omega-3 fatty acids 1000 MG capsule Take 1 g by mouth daily. 5 days per week   fluticasone 50 MCG/ACT nasal spray Commonly known as: FLONASE PLACE 2 SPRAYS INTO BOTH NOSTRILS AS NEEDED FOR ALLERGIES OR RHINITIS.  naproxen 375 MG tablet Commonly known as: NAPROSYN TAKE 1 TABLET (375 MG TOTAL) 2 (TWO) TIMES DAILY WITH A MEAL.   ZINC PO Take 1 tablet by mouth once a week.   zolpidem 5 MG tablet Commonly known as: AMBIEN Take 1 tablet (5 mg total) by mouth at bedtime as needed for sleep.         Objective:   BP (!) 152/82   Pulse 79   Ht 5\' 8"  (1.727 m)   Wt 184 lb (83.5 kg)   SpO2 98%   BMI 27.98 kg/m   Wt Readings from Last 3 Encounters:  03/06/22 184 lb (83.5 kg)  08/29/21 192 lb (87.1 kg)  07/20/21 190 lb (86.2 kg)    Physical  Exam Vitals and nursing note reviewed.  Constitutional:      General: He is not in acute distress.    Appearance: He is well-developed. He is not diaphoretic.  Eyes:     General: No scleral icterus.    Conjunctiva/sclera: Conjunctivae normal.  Neck:     Thyroid: No thyromegaly.  Cardiovascular:     Rate and Rhythm: Normal rate and regular rhythm.     Heart sounds: Normal heart sounds. No murmur heard. Pulmonary:     Effort: Pulmonary effort is normal. No respiratory distress.     Breath sounds: Normal breath sounds. No wheezing.  Musculoskeletal:        General: Normal range of motion.     Cervical back: Neck supple.  Lymphadenopathy:     Cervical: No cervical adenopathy.  Skin:    General: Skin is warm and dry.     Findings: No rash.  Neurological:     Mental Status: He is alert and oriented to person, place, and time.     Coordination: Coordination normal.  Psychiatric:        Behavior: Behavior normal.       Assessment & Plan:   Problem List Items Addressed This Visit       Cardiovascular and Mediastinum   HTN (hypertension)   Relevant Medications   atorvastatin (LIPITOR) 20 MG tablet     Other   HLD (hyperlipidemia)   Relevant Medications   atorvastatin (LIPITOR) 20 MG tablet   Prediabetes   Insomnia - Primary   Relevant Medications   zolpidem (AMBIEN) 5 MG tablet   Chronic left shoulder pain   Relevant Medications   naproxen (NAPROSYN) 375 MG tablet   Other Visit Diagnoses     Need for shingles vaccine       Relevant Orders   Zoster Recombinant (Shingrix ) (Completed)       He says he gets occasional indigestion and acid, he says it has been diagnosed by him already in the past, it is recommended to try some over-the-counter Pepcid to help whenever he has the symptoms.  He says it is before he eats that he gets the symptoms and when he eats and it calms it down.  Blood pressure elevated today, he is going to check his blood pressure at home over  the next 2 weeks and let me know if it is. Follow up plan: Return in about 6 months (around 09/04/2022), or if symptoms worsen or fail to improve, for Prediabetes and insomnia.  Counseling provided for all of the vaccine components Orders Placed This Encounter  Procedures   Zoster Recombinant (Shingrix )    Caryl Pina, MD [facility] 03/06/2022, 10:51 AM

## 2022-03-06 NOTE — Addendum Note (Signed)
Addended by: Caryl Pina on: 03/06/2022 10:53 AM   Modules accepted: Orders

## 2022-03-07 LAB — CBC WITH DIFFERENTIAL/PLATELET
Basophils Absolute: 0 10*3/uL (ref 0.0–0.2)
Basos: 0 %
EOS (ABSOLUTE): 0.3 10*3/uL (ref 0.0–0.4)
Eos: 2 %
Hematocrit: 48.9 % (ref 37.5–51.0)
Hemoglobin: 15.9 g/dL (ref 13.0–17.7)
Immature Grans (Abs): 0.1 10*3/uL (ref 0.0–0.1)
Immature Granulocytes: 1 %
Lymphocytes Absolute: 2.4 10*3/uL (ref 0.7–3.1)
Lymphs: 20 %
MCH: 28.3 pg (ref 26.6–33.0)
MCHC: 32.5 g/dL (ref 31.5–35.7)
MCV: 87 fL (ref 79–97)
Monocytes Absolute: 1 10*3/uL — ABNORMAL HIGH (ref 0.1–0.9)
Monocytes: 8 %
Neutrophils Absolute: 8.3 10*3/uL — ABNORMAL HIGH (ref 1.4–7.0)
Neutrophils: 69 %
Platelets: 264 10*3/uL (ref 150–450)
RBC: 5.61 x10E6/uL (ref 4.14–5.80)
RDW: 11.9 % (ref 11.6–15.4)
WBC: 12.1 10*3/uL — ABNORMAL HIGH (ref 3.4–10.8)

## 2022-03-07 LAB — CMP14+EGFR
ALT: 29 IU/L (ref 0–44)
AST: 26 IU/L (ref 0–40)
Albumin/Globulin Ratio: 1.7 (ref 1.2–2.2)
Albumin: 4.1 g/dL (ref 3.7–4.7)
Alkaline Phosphatase: 111 IU/L (ref 44–121)
BUN/Creatinine Ratio: 11 (ref 10–24)
BUN: 11 mg/dL (ref 8–27)
Bilirubin Total: 0.4 mg/dL (ref 0.0–1.2)
CO2: 23 mmol/L (ref 20–29)
Calcium: 9.5 mg/dL (ref 8.6–10.2)
Chloride: 100 mmol/L (ref 96–106)
Creatinine, Ser: 1 mg/dL (ref 0.76–1.27)
Globulin, Total: 2.4 g/dL (ref 1.5–4.5)
Glucose: 110 mg/dL — ABNORMAL HIGH (ref 70–99)
Potassium: 4.6 mmol/L (ref 3.5–5.2)
Sodium: 137 mmol/L (ref 134–144)
Total Protein: 6.5 g/dL (ref 6.0–8.5)
eGFR: 74 mL/min/{1.73_m2} (ref >59)

## 2022-03-07 LAB — LIPID PANEL
Chol/HDL Ratio: 2.8 ratio (ref 0.0–5.0)
Cholesterol, Total: 135 mg/dL (ref 100–199)
HDL: 49 mg/dL (ref >39)
LDL Chol Calc (NIH): 63 mg/dL (ref 0–99)
Triglycerides: 128 mg/dL (ref 0–149)
VLDL Cholesterol Cal: 23 mg/dL (ref 5–40)

## 2022-03-18 ENCOUNTER — Encounter: Payer: Self-pay | Admitting: *Deleted

## 2022-04-03 ENCOUNTER — Other Ambulatory Visit: Payer: Self-pay | Admitting: Family Medicine

## 2022-04-03 DIAGNOSIS — G8929 Other chronic pain: Secondary | ICD-10-CM

## 2022-07-23 ENCOUNTER — Ambulatory Visit (INDEPENDENT_AMBULATORY_CARE_PROVIDER_SITE_OTHER): Payer: Medicare PPO

## 2022-07-23 VITALS — Ht 67.0 in | Wt 184.0 lb

## 2022-07-23 DIAGNOSIS — Z0000 Encounter for general adult medical examination without abnormal findings: Secondary | ICD-10-CM

## 2022-07-23 NOTE — Patient Instructions (Signed)
Terry Winters , Thank you for taking time to come for your Medicare Wellness Visit. I appreciate your ongoing commitment to your health goals. Please review the following plan we discussed and let me know if I can assist you in the future.   These are the goals we discussed:  Goals      DIET - EAT MORE FRUITS AND VEGETABLES     DIET - INCREASE WATER INTAKE        This is a list of the screening recommended for you and due dates:  Health Maintenance  Topic Date Due   COVID-19 Vaccine (4 - 2023-24 season) 10/12/2021   Flu Shot  09/12/2022   Medicare Annual Wellness Visit  07/23/2023   DTaP/Tdap/Td vaccine (2 - Td or Tdap) 12/20/2029   Pneumonia Vaccine  Completed   Zoster (Shingles) Vaccine  Completed   HPV Vaccine  Aged Out    Advanced directives: Advance directive discussed with you today. I have provided a copy for you to complete at home and have notarized. Once this is complete please bring a copy in to our office so we can scan it into your chart.   Conditions/risks identified: Aim for 30 minutes of exercise or brisk walking, 6-8 glasses of water, and 5 servings of fruits and vegetables each day.   Next appointment: Follow up in one year for your annual wellness visit.   Preventive Care 86 Years and Older, Male  Preventive care refers to lifestyle choices and visits with your health care provider that can promote health and wellness. What does preventive care include? A yearly physical exam. This is also called an annual well check. Dental exams once or twice a year. Routine eye exams. Ask your health care provider how often you should have your eyes checked. Personal lifestyle choices, including: Daily care of your teeth and gums. Regular physical activity. Eating a healthy diet. Avoiding tobacco and drug use. Limiting alcohol use. Practicing safe sex. Taking low doses of aspirin every day. Taking vitamin and mineral supplements as recommended by your health care  provider. What happens during an annual well check? The services and screenings done by your health care provider during your annual well check will depend on your age, overall health, lifestyle risk factors, and family history of disease. Counseling  Your health care provider may ask you questions about your: Alcohol use. Tobacco use. Drug use. Emotional well-being. Home and relationship well-being. Sexual activity. Eating habits. History of falls. Memory and ability to understand (cognition). Work and work Astronomer. Screening  You may have the following tests or measurements: Height, weight, and BMI. Blood pressure. Lipid and cholesterol levels. These may be checked every 5 years, or more frequently if you are over 67 years old. Skin check. Lung cancer screening. You may have this screening every year starting at age 46 if you have a 30-pack-year history of smoking and currently smoke or have quit within the past 15 years. Fecal occult blood test (FOBT) of the stool. You may have this test every year starting at age 32. Flexible sigmoidoscopy or colonoscopy. You may have a sigmoidoscopy every 5 years or a colonoscopy every 10 years starting at age 61. Prostate cancer screening. Recommendations will vary depending on your family history and other risks. Hepatitis C blood test. Hepatitis B blood test. Sexually transmitted disease (STD) testing. Diabetes screening. This is done by checking your blood sugar (glucose) after you have not eaten for a while (fasting). You may have this done  every 1-3 years. Abdominal aortic aneurysm (AAA) screening. You may need this if you are a current or former smoker. Osteoporosis. You may be screened starting at age 26 if you are at high risk. Talk with your health care provider about your test results, treatment options, and if necessary, the need for more tests. Vaccines  Your health care provider may recommend certain vaccines, such  as: Influenza vaccine. This is recommended every year. Tetanus, diphtheria, and acellular pertussis (Tdap, Td) vaccine. You may need a Td booster every 10 years. Zoster vaccine. You may need this after age 79. Pneumococcal 13-valent conjugate (PCV13) vaccine. One dose is recommended after age 76. Pneumococcal polysaccharide (PPSV23) vaccine. One dose is recommended after age 69. Talk to your health care provider about which screenings and vaccines you need and how often you need them. This information is not intended to replace advice given to you by your health care provider. Make sure you discuss any questions you have with your health care provider. Document Released: 02/24/2015 Document Revised: 10/18/2015 Document Reviewed: 11/29/2014 Elsevier Interactive Patient Education  2017 [employer].  Fall Prevention in the Home Falls can cause injuries. They can happen to people of all ages. There are many things you can do to make your home safe and to help prevent falls. What can I do on the outside of my home? Regularly fix the edges of walkways and driveways and fix any cracks. Remove anything that might make you trip as you walk through a door, such as a raised step or threshold. Trim any bushes or trees on the path to your home. Use bright outdoor lighting. Clear any walking paths of anything that might make someone trip, such as rocks or tools. Regularly check to see if handrails are loose or broken. Make sure that both sides of any steps have handrails. Any raised decks and porches should have guardrails on the edges. Have any leaves, snow, or ice cleared regularly. Use sand or salt on walking paths during winter. Clean up any spills in your garage right away. This includes oil or grease spills. What can I do in the bathroom? Use night lights. Install grab bars by the toilet and in the tub and shower. Do not use towel bars as grab bars. Use non-skid mats or decals in the tub or  shower. If you need to sit down in the shower, use a plastic, non-slip stool. Keep the floor dry. Clean up any water that spills on the floor as soon as it happens. Remove soap buildup in the tub or shower regularly. Attach bath mats securely with double-sided non-slip rug tape. Do not have throw rugs and other things on the floor that can make you trip. What can I do in the bedroom? Use night lights. Make sure that you have a light by your bed that is easy to reach. Do not use any sheets or blankets that are too big for your bed. They should not hang down onto the floor. Have a firm chair that has side arms. You can use this for support while you get dressed. Do not have throw rugs and other things on the floor that can make you trip. What can I do in the kitchen? Clean up any spills right away. Avoid walking on wet floors. Keep items that you use a lot in easy-to-reach places. If you need to reach something above you, use a strong step stool that has a grab bar. Keep electrical cords out of the  way. Do not use floor polish or wax that makes floors slippery. If you must use wax, use non-skid floor wax. Do not have throw rugs and other things on the floor that can make you trip. What can I do with my stairs? Do not leave any items on the stairs. Make sure that there are handrails on both sides of the stairs and use them. Fix handrails that are broken or loose. Make sure that handrails are as long as the stairways. Check any carpeting to make sure that it is firmly attached to the stairs. Fix any carpet that is loose or worn. Avoid having throw rugs at the top or bottom of the stairs. If you do have throw rugs, attach them to the floor with carpet tape. Make sure that you have a light switch at the top of the stairs and the bottom of the stairs. If you do not have them, ask someone to add them for you. What else can I do to help prevent falls? Wear shoes that: Do not have high heels. Have  rubber bottoms. Are comfortable and fit you well. Are closed at the toe. Do not wear sandals. If you use a stepladder: Make sure that it is fully opened. Do not climb a closed stepladder. Make sure that both sides of the stepladder are locked into place. Ask someone to hold it for you, if possible. Clearly mark and make sure that you can see: Any grab bars or handrails. First and last steps. Where the edge of each step is. Use tools that help you move around (mobility aids) if they are needed. These include: Canes. Walkers. Scooters. Crutches. Turn on the lights when you go into a dark area. Replace any light bulbs as soon as they burn out. Set up your furniture so you have a clear path. Avoid moving your furniture around. If any of your floors are uneven, fix them. If there are any pets around you, be aware of where they are. Review your medicines with your doctor. Some medicines can make you feel dizzy. This can increase your chance of falling. Ask your doctor what other things that you can do to help prevent falls. This information is not intended to replace advice given to you by your health care provider. Make sure you discuss any questions you have with your health care provider. Document Released: 11/24/2008 Document Revised: 07/06/2015 Document Reviewed: 03/04/2014 Elsevier Interactive Patient Education  2017 [employer].

## 2022-07-23 NOTE — Progress Notes (Signed)
Subjective:   Terry Winters is a 88 y.o. male who presents for Medicare Annual/Subsequent preventive examination. I connected with  Terry Winters on 07/23/22 by a audio enabled telemedicine application and verified that I am speaking with the correct person using two identifiers.  Patient Location: Home  Provider Location: Home Office  I discussed the limitations of evaluation and management by telemedicine. The patient expressed understanding and agreed to proceed.  Review of Systems     Cardiac Risk Factors include: advanced age (>41men, >72 women);hypertension;male gender;dyslipidemia     Objective:    Today's Vitals   07/23/22 0820  Weight: 184 lb (83.5 kg)  Height: 5\' 7"  (1.702 m)   Body mass index is 28.82 kg/m.     07/23/2022    8:26 AM 07/20/2021    8:23 AM 07/19/2020   10:15 AM 10/16/2017    2:26 PM 11/14/2016   11:15 AM 11/10/2014    1:05 PM  Advanced Directives  Does Patient Have a Medical Advance Directive? No No No No No No  Would patient like information on creating a medical advance directive? No - Patient declined No - Patient declined No - Patient declined No - Patient declined No - Patient declined No - patient declined information    Current Medications (verified) Outpatient Encounter Medications as of 07/23/2022  Medication Sig   amLODipine (NORVASC) 2.5 MG tablet Take 1 tablet (2.5 mg total) by mouth daily.   aspirin 81 MG tablet Take 81 mg by mouth every morning.   atorvastatin (LIPITOR) 20 MG tablet TAKE 1 TABLET EVERY DAY AT 6 PM   finasteride (PROSCAR) 5 MG tablet TAKE 1 TABLET BY MOUTH EVERY DAY IN THE MORNING   fish oil-omega-3 fatty acids 1000 MG capsule Take 1 g by mouth daily. 5 days per week   fluticasone (FLONASE) 50 MCG/ACT nasal spray PLACE 2 SPRAYS INTO BOTH NOSTRILS AS NEEDED FOR ALLERGIES OR RHINITIS.   Multiple Vitamins-Minerals (ZINC PO) Take 1 tablet by mouth once a week.    naproxen (NAPROSYN) 375 MG tablet TAKE 1 TABLET (375 MG TOTAL) 2  (TWO) TIMES DAILY WITH A MEAL.   zolpidem (AMBIEN) 5 MG tablet Take 1 tablet (5 mg total) by mouth at bedtime as needed for sleep.   No facility-administered encounter medications on file as of 07/23/2022.    Allergies (verified) Amoxicillin, Prednisone, Sulfa antibiotics, and Triamcinolone   History: Past Medical History:  Diagnosis Date   BPH (benign prostatic hypertrophy)    Foley catheter in place    GERD (gastroesophageal reflux disease)    watches diet   History of gout    as of 11-12-2016  per pt stable   Hyperlipidemia    Hypertension    per pt stopped his medication losartan on his own--- last taken 2nd week sept. 2018--- per pt checks bp and has been ok and watches diet   Seasonal allergies    Type 2 diabetes, diet controlled (HCC)    Urinary retention    Vitamin D deficiency    Wears glasses    Wears partial dentures    upper   Past Surgical History:  Procedure Laterality Date   ANTERIOR CERVICAL DECOMP/DISCECTOMY FUSION  1993   TONSILLECTOMY  child   TRANSURETHRAL RESECTION OF PROSTATE N/A 11/14/2016   Procedure: TRANSURETHRAL RESECTION OF THE PROSTATE (TURP);  Surgeon: Terry Pippin, MD;  Location: [facility];  Service: Urology;  Laterality: N/A;   Family History  Problem Relation Age of Onset  ALS Father    Social History   Socioeconomic History   Marital status: Married    Spouse name: Terry Winters   Number of children: 3   Years of education: Not on file   Highest education level: Not on file  Occupational History   Occupation: retired  Tobacco Use   Smoking status: Former    Packs/day: 0.50    Years: 15.00    Additional pack years: 0.00    Total pack years: 7.50    Types: Cigars, Cigarettes    Quit date: 06/16/1966    Years since quitting: 56.1   Smokeless tobacco: Never  Vaping Use   Vaping Use: Never used  Substance and Sexual Activity   Alcohol use: Yes    Comment: occasionally   Drug use: No   Sexual activity: Not on file   Other Topics Concern   Not on file  Social History Narrative   07/20/2021 - Lives home with wife. Enjoys playing golf. Cuts hay, mows, lots of yard work, very active   Teenage grandsons stay with them a lot of weekends   Social Determinants of Health   Financial Resource Strain: Low Risk  (07/23/2022)   Overall Financial Resource Strain (CARDIA)    Difficulty of Paying Living Expenses: Not hard at all  Food Insecurity: No Food Insecurity (07/23/2022)   Hunger Vital Sign    Worried About Running Out of Food in the Last Year: Never true    Ran Out of Food in the Last Year: Never true  Transportation Needs: No Transportation Needs (07/23/2022)   PRAPARE - Administrator, Civil Service (Medical): No    Lack of Transportation (Non-Medical): No  Physical Activity: Insufficiently Active (07/23/2022)   Exercise Vital Sign    Days of Exercise per Week: 3 days    Minutes of Exercise per Session: 30 min  Stress: No Stress Concern Present (07/23/2022)   [employer] of Occupational Health - Occupational Stress Questionnaire    Feeling of Stress : Not at all  Social Connections: Socially Integrated (07/23/2022)   Social Connection and Isolation Panel [NHANES]    Frequency of Communication with Friends and Family: More than three times a week    Frequency of Social Gatherings with Friends and Family: More than three times a week    Attends Religious Services: More than 4 times per year    Active Member of [employer] or Organizations: Yes    Attends Engineer, structural: More than 4 times per year    Marital Status: Married    Tobacco Counseling Counseling given: Not Answered   Clinical Intake:  Pre-visit preparation completed: Yes  Pain : No/denies pain     Nutritional Risks: None Diabetes: No  How often do you need to have someone help you when you read instructions, pamphlets, or other written materials from your doctor or pharmacy?: 1 - Never  Diabetic?no    Interpreter Needed?: No  Information entered by :: Renie Ora, LPN   Activities of Daily Living    07/23/2022    8:26 AM  In your present state of health, do you have any difficulty performing the following activities:  Hearing? 0  Vision? 0  Difficulty concentrating or making decisions? 0  Walking or climbing stairs? 0  Dressing or bathing? 0  Doing errands, shopping? 0  Preparing Food and eating ? N  Using the Toilet? N  In the past six months, have you accidently leaked urine? N  Do  you have problems with loss of bowel control? N  Managing your Medications? N  Managing your Finances? N  Housekeeping or managing your Housekeeping? N    Patient Care Team: Dettinger, Elige Radon, MD as PCP - General (Family Medicine) Terry Pippin, MD as Attending Physician (Urology) Dione Booze, Bertram Millard, MD as Consulting Physician (Ophthalmology)  Indicate any recent Medical Services you may have received from other than [facility] providers in the past year (date may be approximate).     Assessment:   This is a routine wellness examination for Sakariye.  Hearing/Vision screen Vision Screening - Comments:: Wears rx glasses - up to date with routine eye exams with  Dr.Johnson   Dietary issues and exercise activities discussed: Current Exercise Habits: Home exercise routine, Type of exercise: walking, Time (Minutes): 30, Frequency (Times/Week): 3, Weekly Exercise (Minutes/Week): 90, Intensity: Mild, Exercise limited by: None identified   Goals Addressed             This Visit's Progress    DIET - EAT MORE FRUITS AND VEGETABLES   On track      Depression Screen    07/23/2022    8:24 AM 03/06/2022   10:31 AM 08/29/2021   11:00 AM 07/20/2021    8:24 AM 03/01/2021    9:33 AM 01/26/2021    4:27 PM 07/19/2020   10:03 AM  PHQ 2/9 Scores  PHQ - 2 Score 0 0 0 0 0 0 0  PHQ- 9 Score     0 0     Fall Risk    07/23/2022    8:22 AM 03/06/2022   10:31 AM 08/29/2021   11:00 AM 07/20/2021    8:18  AM 03/01/2021    9:33 AM  Fall Risk   Falls in the past year? 0 0 0 0 0  Number falls in past yr: 0   0   Injury with Fall? 0   0   Risk for fall due to : No Fall Risks   Orthopedic patient   Follow up Falls prevention discussed   Falls prevention discussed     FALL RISK PREVENTION PERTAINING TO THE HOME:  Any stairs in or around the home? Yes  If so, are there any without handrails? No  Home free of loose throw rugs in walkways, pet beds, electrical cords, etc? Yes  Adequate lighting in your home to reduce risk of falls? Yes   ASSISTIVE DEVICES UTILIZED TO PREVENT FALLS:  Life alert? No  Use of a cane, walker or w/c? No  Grab bars in the bathroom? Yes  Shower chair or bench in shower? Yes  Elevated toilet seat or a handicapped toilet? Yes       10/16/2017    2:29 PM 11/10/2014    1:09 PM  MMSE - Mini Mental State Exam  Orientation to time 5 5  Orientation to Place 5 5  Registration 3 3  Attention/ Calculation 5 5  Recall 3 3  Language- name 2 objects 2 2  Language- repeat 1 1  Language- follow 3 step command 3 3  Language- read & follow direction 1 1  Write a sentence 1 1  Copy design 1 1  Total score 30 30        07/23/2022    8:26 AM 07/20/2021    8:26 AM 07/19/2020   10:07 AM  6CIT Screen  What Year? 0 points 0 points 0 points  What month? 0 points 0 points 0 points  What  time? 0 points 0 points 0 points  Count back from 20 0 points 0 points 0 points  Months in reverse 0 points 0 points 0 points  Repeat phrase 0 points 0 points 0 points  Total Score 0 points 0 points 0 points    Immunizations Immunization History  Administered Date(s) Administered   Fluad Quad(high Dose 65+) 11/13/2020, 11/28/2021   Influenza, High Dose Seasonal PF 12/17/2016, 12/01/2017, 12/09/2018, 12/08/2019   Influenza,inj,Quad PF,6+ Mos 12/30/2013, 11/10/2014, 01/08/2016   Moderna Sars-Covid-2 Vaccination 03/10/2019, 04/07/2019, 10/28/2019   Pneumococcal Conjugate-13 01/08/2016    Pneumococcal Polysaccharide-23 07/16/2013   Tdap 12/21/2019   Zoster Recombinat (Shingrix) 08/29/2021, 03/06/2022    TDAP status: Up to date  Flu Vaccine status: Up to date  Pneumococcal vaccine status: Up to date  Covid-19 vaccine status: Completed vaccines  Qualifies for Shingles Vaccine? Yes   Zostavax completed Yes   Shingrix Completed?: Yes  Screening Tests Health Maintenance  Topic Date Due   COVID-19 Vaccine (4 - 2023-24 season) 10/12/2021   INFLUENZA VACCINE  09/12/2022   Medicare Annual Wellness (AWV)  07/23/2023   DTaP/Tdap/Td (2 - Td or Tdap) 12/20/2029   Pneumonia Vaccine 49+ Years old  Completed   Zoster Vaccines- Shingrix  Completed   HPV VACCINES  Aged Out    Health Maintenance  Health Maintenance Due  Topic Date Due   COVID-19 Vaccine (4 - 2023-24 season) 10/12/2021    Colorectal cancer screening: No longer required.   Lung Cancer Screening: (Low Dose CT Chest recommended if Age 37-80 years, 30 pack-year currently smoking OR have quit w/in 15years.) does not qualify.   Lung Cancer Screening Referral: n/a  Additional Screening:  Hepatitis C Screening: does not qualify;   Vision Screening: Recommended annual ophthalmology exams for early detection of glaucoma and other disorders of the eye. Is the patient up to date with their annual eye exam?  Yes  Who is the provider or what is the name of the office in which the patient attends annual eye exams? Dr.johnson  If pt is not established with a provider, would they like to be referred to a provider to establish care? No .   Dental Screening: Recommended annual dental exams for proper oral hygiene  Community Resource Referral / Chronic Care Management: CRR required this visit?  No   CCM required this visit?  No      Plan:     I have personally reviewed and noted the following in the patient's chart:   Medical and social history Use of alcohol, tobacco or illicit drugs  Current medications  and supplements including opioid prescriptions. Patient is not currently taking opioid prescriptions. Functional ability and status Nutritional status Physical activity Advanced directives List of other physicians Hospitalizations, surgeries, and ER visits in previous 12 months Vitals Screenings to include cognitive, depression, and falls Referrals and appointments  In addition, I have reviewed and discussed with patient certain preventive protocols, quality metrics, and best practice recommendations. A written personalized care plan for preventive services as well as general preventive health recommendations were provided to patient.     Lorrene Reid, LPN   05/20/8117   Nurse Notes: none

## 2022-08-30 ENCOUNTER — Ambulatory Visit: Payer: Medicare PPO | Admitting: Family Medicine

## 2022-09-02 ENCOUNTER — Ambulatory Visit: Payer: Medicare PPO | Admitting: Family Medicine

## 2022-09-22 ENCOUNTER — Other Ambulatory Visit: Payer: Self-pay | Admitting: Family Medicine

## 2022-10-03 ENCOUNTER — Encounter: Payer: Self-pay | Admitting: Family Medicine

## 2022-10-03 ENCOUNTER — Ambulatory Visit (INDEPENDENT_AMBULATORY_CARE_PROVIDER_SITE_OTHER): Payer: Medicare PPO | Admitting: Family Medicine

## 2022-10-03 VITALS — BP 151/74 | HR 83 | Ht 67.0 in | Wt 188.0 lb

## 2022-10-03 DIAGNOSIS — Z79899 Other long term (current) drug therapy: Secondary | ICD-10-CM

## 2022-10-03 DIAGNOSIS — G4700 Insomnia, unspecified: Secondary | ICD-10-CM | POA: Diagnosis not present

## 2022-10-03 DIAGNOSIS — E782 Mixed hyperlipidemia: Secondary | ICD-10-CM | POA: Diagnosis not present

## 2022-10-03 DIAGNOSIS — I10 Essential (primary) hypertension: Secondary | ICD-10-CM

## 2022-10-03 DIAGNOSIS — R7303 Prediabetes: Secondary | ICD-10-CM

## 2022-10-03 LAB — BAYER DCA HB A1C WAIVED: HB A1C (BAYER DCA - WAIVED): 5.8 % — ABNORMAL HIGH (ref 4.8–5.6)

## 2022-10-03 NOTE — Progress Notes (Signed)
BP (!) 151/74   Pulse 83   Ht 5\' 7"  (1.702 m)   Wt 188 lb (85.3 kg)   SpO2 96%   BMI 29.44 kg/m    Subjective:   Patient ID: Terry Winters, male    DOB: [birth year], 88 y.o.   MRN: [id number]  HPI: Terry Winters is a 88 y.o. male presenting on 10/03/2022 for Medical Management of Chronic Issues, Hyperlipidemia, Hypertension, and Prediabetes   HPI Prediabetes Patient comes in today for recheck of his diabetes. Patient has been currently taking no medicine. Patient is not currently on an ACE inhibitor/ARB. Patient has not seen an ophthalmologist this year. Patient denies any new issues with their feet. The symptom started onset as an adult hypertension and hyperlipidemia ARE RELATED TO DM   Hypertension Patient is currently on amlodipine, and their blood pressure today is 133/64. Patient denies any lightheadedness or dizziness. Patient denies headaches, blurred vision, chest pains, shortness of breath, or weakness. Denies any side effects from medication and is content with current medication.   Hyperlipidemia Patient is coming in for recheck of his hyperlipidemia. The patient is currently taking atorvastatin. They deny any issues with myalgias or history of liver damage from it. They deny any focal numbness or weakness or chest pain.   Insomnia recheck Current rx-Ambien 5 mg nightly as needed # meds rx-30/month Effectiveness of current meds-works well Adverse reactions form meds-none Pill count performed-No Last drug screen -09/06/2021 ( high risk q5m, moderate risk q58m, low risk yearly ) Urine drug screen today- Yes Was the NCCSR reviewed-yes  If yes were their any concerning findings? -None Controlled substance contract signed on: 09/06/2021  Relevant past medical, surgical, family and social history reviewed and updated as indicated. Interim medical history since our last visit reviewed. Allergies and medications reviewed and updated.  Review of Systems  Constitutional:   Negative for chills and fever.  Eyes:  Negative for visual disturbance.  Respiratory:  Negative for shortness of breath and wheezing.   Cardiovascular:  Negative for chest pain and leg swelling.  Musculoskeletal:  Negative for back pain and gait problem.  Skin:  Negative for rash.  Neurological:  Negative for dizziness, weakness and light-headedness.  All other systems reviewed and are negative.   Per HPI unless specifically indicated above   Allergies as of 10/03/2022       Reactions   Amoxicillin Rash   Prednisone Rash   Sulfa Antibiotics Other (See Comments)   "upset stomach"   Triamcinolone Rash        Medication List        Accurate as of October 03, 2022  4:46 PM. If you have any questions, ask your nurse or doctor.          amLODipine 2.5 MG tablet Commonly known as: NORVASC TAKE 1 TABLET BY MOUTH EVERY DAY   aspirin 81 MG tablet Take 81 mg by mouth every morning.   atorvastatin 20 MG tablet Commonly known as: LIPITOR TAKE 1 TABLET EVERY DAY AT 6 PM   finasteride 5 MG tablet Commonly known as: PROSCAR TAKE 1 TABLET BY MOUTH EVERY DAY IN THE MORNING   fish oil-omega-3 fatty acids 1000 MG capsule Take 1 g by mouth daily. 5 days per week   fluticasone 50 MCG/ACT nasal spray Commonly known as: FLONASE PLACE 2 SPRAYS INTO BOTH NOSTRILS AS NEEDED FOR ALLERGIES OR RHINITIS.   naproxen 375 MG tablet Commonly known as: NAPROSYN TAKE 1 TABLET (375 MG TOTAL) 2 (TWO)  TIMES DAILY WITH A MEAL.   ZINC PO Take 1 tablet by mouth once a week.   zolpidem 5 MG tablet Commonly known as: AMBIEN Take 1 tablet (5 mg total) by mouth at bedtime as needed for sleep.         Objective:   BP (!) 151/74   Pulse 83   Ht 5\' 7"  (1.702 m)   Wt 188 lb (85.3 kg)   SpO2 96%   BMI 29.44 kg/m   Wt Readings from Last 3 Encounters:  10/03/22 188 lb (85.3 kg)  07/23/22 184 lb (83.5 kg)  03/06/22 184 lb (83.5 kg)    Physical Exam Vitals and nursing note reviewed.   Constitutional:      General: He is not in acute distress.    Appearance: He is well-developed. He is not diaphoretic.  Eyes:     General: No scleral icterus.    Conjunctiva/sclera: Conjunctivae normal.  Neck:     Thyroid: No thyromegaly.  Cardiovascular:     Rate and Rhythm: Normal rate and regular rhythm.     Heart sounds: Normal heart sounds. No murmur heard. Pulmonary:     Effort: Pulmonary effort is normal. No respiratory distress.     Breath sounds: Normal breath sounds. No wheezing.  Musculoskeletal:        General: No swelling. Normal range of motion.     Cervical back: Neck supple.  Lymphadenopathy:     Cervical: No cervical adenopathy.  Skin:    General: Skin is warm and dry.     Findings: No rash.  Neurological:     Mental Status: He is alert and oriented to person, place, and time.     Coordination: Coordination normal.  Psychiatric:        Behavior: Behavior normal.       Assessment & Plan:   Problem List Items Addressed This Visit       Cardiovascular and Mediastinum   HTN (hypertension) - Primary   Relevant Orders   CBC with Differential/Platelet   CMP14+EGFR   Lipid panel   Bayer DCA Hb A1c Waived (Completed)     Other   HLD (hyperlipidemia)   Relevant Orders   CBC with Differential/Platelet   CMP14+EGFR   Lipid panel   Bayer DCA Hb A1c Waived (Completed)   Prediabetes   Relevant Orders   CBC with Differential/Platelet   CMP14+EGFR   Lipid panel   Bayer DCA Hb A1c Waived (Completed)   Insomnia   Other Visit Diagnoses     Controlled substance agreement signed       Relevant Orders   ToxASSURE Select 13 (MW), Urine     A1c looks good at 5.9.  Blood pressure looks good, will continue to monitor.  No changes.  Follow up plan: Return in about 6 months (around 04/05/2023), or if symptoms worsen or fail to improve, for Prediabetes and hypertension and cholesterol recheck.  Counseling provided for all of the vaccine components Orders  Placed This Encounter  Procedures   CBC with Differential/Platelet   CMP14+EGFR   Lipid panel   Bayer DCA Hb A1c Waived   ToxASSURE Select 13 (MW), Urine    Arville Care, MD [facility] 10/03/2022, 4:46 PM

## 2022-10-04 DIAGNOSIS — Z79899 Other long term (current) drug therapy: Secondary | ICD-10-CM | POA: Diagnosis not present

## 2022-10-04 LAB — LIPID PANEL
Chol/HDL Ratio: 3 ratio (ref 0.0–5.0)
Cholesterol, Total: 133 mg/dL (ref 100–199)
HDL: 44 mg/dL (ref >39)
LDL Chol Calc (NIH): 58 mg/dL (ref 0–99)
Triglycerides: 185 mg/dL — ABNORMAL HIGH (ref 0–149)
VLDL Cholesterol Cal: 31 mg/dL (ref 5–40)

## 2022-10-04 LAB — CBC WITH DIFFERENTIAL/PLATELET
Basophils Absolute: 0 10*3/uL (ref 0.0–0.2)
Basos: 0 %
EOS (ABSOLUTE): 0.2 10*3/uL (ref 0.0–0.4)
Eos: 2 %
Hematocrit: 47.4 % (ref 37.5–51.0)
Hemoglobin: 15.5 g/dL (ref 13.0–17.7)
Immature Grans (Abs): 0.1 10*3/uL (ref 0.0–0.1)
Immature Granulocytes: 1 %
Lymphocytes Absolute: 2.7 10*3/uL (ref 0.7–3.1)
Lymphs: 25 %
MCH: 28.5 pg (ref 26.6–33.0)
MCHC: 32.7 g/dL (ref 31.5–35.7)
MCV: 87 fL (ref 79–97)
Monocytes Absolute: 1 10*3/uL — ABNORMAL HIGH (ref 0.1–0.9)
Monocytes: 9 %
Neutrophils Absolute: 6.9 10*3/uL (ref 1.4–7.0)
Neutrophils: 63 %
Platelets: 240 10*3/uL (ref 150–450)
RBC: 5.44 x10E6/uL (ref 4.14–5.80)
RDW: 13 % (ref 11.6–15.4)
WBC: 10.9 10*3/uL — ABNORMAL HIGH (ref 3.4–10.8)

## 2022-10-04 LAB — CMP14+EGFR
ALT: 24 IU/L (ref 0–44)
AST: 23 IU/L (ref 0–40)
Albumin: 4.2 g/dL (ref 3.7–4.7)
Alkaline Phosphatase: 102 IU/L (ref 44–121)
BUN/Creatinine Ratio: 19 (ref 10–24)
BUN: 20 mg/dL (ref 8–27)
Bilirubin Total: 0.4 mg/dL (ref 0.0–1.2)
CO2: 25 mmol/L (ref 20–29)
Calcium: 9.4 mg/dL (ref 8.6–10.2)
Chloride: 100 mmol/L (ref 96–106)
Creatinine, Ser: 1.03 mg/dL (ref 0.76–1.27)
Globulin, Total: 2.1 g/dL (ref 1.5–4.5)
Glucose: 124 mg/dL — ABNORMAL HIGH (ref 70–99)
Potassium: 4.8 mmol/L (ref 3.5–5.2)
Sodium: 139 mmol/L (ref 134–144)
Total Protein: 6.3 g/dL (ref 6.0–8.5)
eGFR: 71 mL/min/{1.73_m2} (ref >59)

## 2022-10-09 LAB — TOXASSURE SELECT 13 (MW), URINE

## 2022-10-16 ENCOUNTER — Other Ambulatory Visit: Payer: Self-pay | Admitting: Family Medicine

## 2022-10-16 DIAGNOSIS — G4700 Insomnia, unspecified: Secondary | ICD-10-CM

## 2022-10-16 NOTE — Telephone Encounter (Signed)
Sent refill

## 2022-11-22 ENCOUNTER — Other Ambulatory Visit: Payer: Self-pay | Admitting: Family Medicine

## 2022-12-15 ENCOUNTER — Other Ambulatory Visit: Payer: Self-pay | Admitting: Family Medicine

## 2022-12-16 MED ORDER — FLUTICASONE PROPIONATE 50 MCG/ACT NA SUSP: 2.0000 | NASAL | 1 refills | Status: AC | PRN

## 2022-12-16 NOTE — Telephone Encounter (Signed)
Refill failed. resent °

## 2022-12-16 NOTE — Addendum Note (Signed)
Addended by: Julious Payer D on: 12/16/2022 09:20 AM   Modules accepted: Orders

## 2022-12-24 ENCOUNTER — Other Ambulatory Visit: Payer: Self-pay | Admitting: Family Medicine

## 2023-01-07 DIAGNOSIS — H2513 Age-related nuclear cataract, bilateral: Secondary | ICD-10-CM | POA: Diagnosis not present

## 2023-01-17 ENCOUNTER — Other Ambulatory Visit: Payer: Self-pay

## 2023-01-17 DIAGNOSIS — G8929 Other chronic pain: Secondary | ICD-10-CM

## 2023-01-17 MED ORDER — NAPROXEN 375 MG PO TABS: ORAL_TABLET | ORAL | 0 refills | Status: DC

## 2023-02-21 ENCOUNTER — Ambulatory Visit: Payer: Self-pay | Admitting: Family Medicine

## 2023-02-21 NOTE — Telephone Encounter (Addendum)
 Copied from CRM [id number]. Topic: Clinical - Red Word Triage >> Feb 21, 2023  1:10 PM Delon DASEN wrote: [facility] that prompted transfer to Nurse Triage: Wife thinks patient had a stroke three days ago, feeling confused, slurred speech, can't straighten fingers on right.   Chief Complaint: Disoriented Symptoms: confusion,slurred speech/mumbling, right hand hurts and hard to straighten fingers) Frequency: Ongoing for past 3 days Pertinent Negatives: Denies weakness in arms or legs Disposition: [x] ED /[] Urgent Care (no appt availability in office) / [] Appointment(In office/virtual)/ []  [facility]/ [] Home Care/ [x] Refused Recommended Disposition /[] [facility]/ []  Follow-up with PCP  Additional Notes: Patient's wife called. This RN briefly spoke to patient directly. Patient is oriented to self, time, place. However, he does not answer assessment questions about his symptoms appropriately.  His speech is slurred and he is mumbling. He does not stay on one topic while talking. Per wife, patient is not himself and these symptoms started 3 days ago. Advised the wife that patient needs to be seen in ED now. This RN offered multiple times to call EMS. Patient and wife declining. Patient stated he will drive himself. RN advised this is unsafe due to his hand and his confusion. This RN advised wife or another adult to bring patient to the ED right away. Wife stated she can bring him, but the patient does not want to go right at this moment and will go at a later time. This RN reinforced again that patient should be seen now. Instructed patient's wife to call EMS if anything worsens before they get to ED.  Reason for Disposition  Difficult to awaken or acting confused (e.g., disoriented, slurred speech)  Answer Assessment - Initial Assessment Questions 1. SYMPTOM: What is the main symptom you are concerned about? (e.g., weakness, numbness)   Per wife, patient is Disoriented, not  himself, speech a little slurred and mumbling  2. ONSET: When did this start? (minutes, hours, days; while sleeping)     3 days ago  3. NEUROLOGIC SYMPTOMS: Have you had any of the following symptoms: headache, dizziness, vision loss, double vision, changes in speech, unsteady on your feet?      R hand hurts/ can't straighten it out. Speech is slurred, mumbling. Patient is still able to walk normally.  4. OTHER SYMPTOMS: Do you have any other symptoms?     Patient's wife denied other symptoms, denies weakness in extremities.  Protocols used: Neurologic Deficit-A-AH

## 2023-02-24 DIAGNOSIS — R296 Repeated falls: Secondary | ICD-10-CM | POA: Diagnosis not present

## 2023-02-24 DIAGNOSIS — G459 Transient cerebral ischemic attack, unspecified: Secondary | ICD-10-CM | POA: Diagnosis not present

## 2023-02-24 DIAGNOSIS — R9089 Other abnormal findings on diagnostic imaging of central nervous system: Secondary | ICD-10-CM | POA: Diagnosis not present

## 2023-02-24 DIAGNOSIS — I6782 Cerebral ischemia: Secondary | ICD-10-CM | POA: Diagnosis not present

## 2023-02-24 DIAGNOSIS — R410 Disorientation, unspecified: Secondary | ICD-10-CM | POA: Insufficient documentation

## 2023-02-24 DIAGNOSIS — J9811 Atelectasis: Secondary | ICD-10-CM | POA: Diagnosis not present

## 2023-02-24 DIAGNOSIS — M6282 Rhabdomyolysis: Secondary | ICD-10-CM | POA: Diagnosis not present

## 2023-02-24 DIAGNOSIS — R4182 Altered mental status, unspecified: Secondary | ICD-10-CM | POA: Diagnosis not present

## 2023-02-24 DIAGNOSIS — R531 Weakness: Secondary | ICD-10-CM | POA: Diagnosis not present

## 2023-02-24 DIAGNOSIS — T796XXA Traumatic ischemia of muscle, initial encounter: Secondary | ICD-10-CM | POA: Diagnosis not present

## 2023-02-24 DIAGNOSIS — Z043 Encounter for examination and observation following other accident: Secondary | ICD-10-CM | POA: Diagnosis not present

## 2023-02-24 DIAGNOSIS — T148XXA Other injury of unspecified body region, initial encounter: Secondary | ICD-10-CM | POA: Insufficient documentation

## 2023-02-24 DIAGNOSIS — R0689 Other abnormalities of breathing: Secondary | ICD-10-CM | POA: Diagnosis not present

## 2023-02-24 DIAGNOSIS — I10 Essential (primary) hypertension: Secondary | ICD-10-CM | POA: Diagnosis not present

## 2023-02-24 DIAGNOSIS — R29898 Other symptoms and signs involving the musculoskeletal system: Secondary | ICD-10-CM | POA: Diagnosis not present

## 2023-02-24 DIAGNOSIS — R748 Abnormal levels of other serum enzymes: Secondary | ICD-10-CM | POA: Insufficient documentation

## 2023-02-24 DIAGNOSIS — Z79899 Other long term (current) drug therapy: Secondary | ICD-10-CM | POA: Diagnosis not present

## 2023-02-24 DIAGNOSIS — W19XXXA Unspecified fall, initial encounter: Secondary | ICD-10-CM | POA: Diagnosis not present

## 2023-02-24 DIAGNOSIS — T1490XA Injury, unspecified, initial encounter: Secondary | ICD-10-CM | POA: Diagnosis not present

## 2023-02-24 NOTE — Telephone Encounter (Signed)
Tried calling pt. No answer, no machine °

## 2023-02-24 NOTE — Telephone Encounter (Signed)
 Sounds like he is possibly had a stroke, we can call him and encouraged him again to go to the emergency department, if he will not then we can at least see if he will see DOD who has openings today.  Maybe they could encourage him to go to the emergency department.

## 2023-02-25 DIAGNOSIS — S52611A Displaced fracture of right ulna styloid process, initial encounter for closed fracture: Secondary | ICD-10-CM | POA: Diagnosis not present

## 2023-02-25 DIAGNOSIS — R531 Weakness: Secondary | ICD-10-CM | POA: Diagnosis not present

## 2023-02-25 DIAGNOSIS — W19XXXA Unspecified fall, initial encounter: Secondary | ICD-10-CM | POA: Diagnosis not present

## 2023-02-25 DIAGNOSIS — M6282 Rhabdomyolysis: Secondary | ICD-10-CM | POA: Diagnosis not present

## 2023-02-25 DIAGNOSIS — S60511A Abrasion of right hand, initial encounter: Secondary | ICD-10-CM | POA: Diagnosis not present

## 2023-02-25 DIAGNOSIS — R410 Disorientation, unspecified: Secondary | ICD-10-CM | POA: Diagnosis not present

## 2023-02-25 DIAGNOSIS — Z0189 Encounter for other specified special examinations: Secondary | ICD-10-CM | POA: Diagnosis not present

## 2023-02-25 DIAGNOSIS — Z880 Allergy status to penicillin: Secondary | ICD-10-CM | POA: Diagnosis not present

## 2023-02-25 DIAGNOSIS — M79641 Pain in right hand: Secondary | ICD-10-CM | POA: Diagnosis not present

## 2023-02-25 DIAGNOSIS — S60519A Abrasion of unspecified hand, initial encounter: Secondary | ICD-10-CM | POA: Diagnosis not present

## 2023-02-25 DIAGNOSIS — M19041 Primary osteoarthritis, right hand: Secondary | ICD-10-CM | POA: Diagnosis not present

## 2023-02-25 NOTE — Telephone Encounter (Signed)
 Spoke with pts wife this am. Pt was admitted yesterday in hospital. She believes that nothing was was found but he is being monitored. Made aware that he will have a hospital follow up in the next 2w and someone would call to schedule.

## 2023-02-26 ENCOUNTER — Other Ambulatory Visit: Payer: Self-pay | Admitting: Family Medicine

## 2023-02-26 DIAGNOSIS — W19XXXA Unspecified fall, initial encounter: Secondary | ICD-10-CM | POA: Diagnosis not present

## 2023-02-26 DIAGNOSIS — M19041 Primary osteoarthritis, right hand: Secondary | ICD-10-CM | POA: Diagnosis not present

## 2023-02-26 DIAGNOSIS — M6282 Rhabdomyolysis: Secondary | ICD-10-CM | POA: Diagnosis not present

## 2023-02-26 DIAGNOSIS — R29898 Other symptoms and signs involving the musculoskeletal system: Secondary | ICD-10-CM | POA: Insufficient documentation

## 2023-02-26 DIAGNOSIS — Z880 Allergy status to penicillin: Secondary | ICD-10-CM | POA: Diagnosis not present

## 2023-02-26 DIAGNOSIS — Z79899 Other long term (current) drug therapy: Secondary | ICD-10-CM | POA: Diagnosis not present

## 2023-02-26 DIAGNOSIS — R531 Weakness: Secondary | ICD-10-CM | POA: Diagnosis not present

## 2023-02-26 DIAGNOSIS — S60511A Abrasion of right hand, initial encounter: Secondary | ICD-10-CM | POA: Diagnosis not present

## 2023-02-26 DIAGNOSIS — E782 Mixed hyperlipidemia: Secondary | ICD-10-CM

## 2023-02-26 DIAGNOSIS — R410 Disorientation, unspecified: Secondary | ICD-10-CM | POA: Diagnosis not present

## 2023-02-26 DIAGNOSIS — S60519A Abrasion of unspecified hand, initial encounter: Secondary | ICD-10-CM | POA: Diagnosis not present

## 2023-02-26 NOTE — Telephone Encounter (Signed)
 Copied from CRM [id number]. Topic: Clinical - Home Health Verbal Orders >> Feb 26, 2023 12:59 PM Blair Bumpers wrote: Caller/Agency: Darla- [facility] Callback Number: [phone], extension [phone] Service Requested: Outpatient Physical Therapy requested by patient Frequency: N/A Any new concerns about the patient? No

## 2023-02-27 ENCOUNTER — Telehealth: Payer: Self-pay | Admitting: *Deleted

## 2023-02-27 ENCOUNTER — Inpatient Hospital Stay: Payer: Medicare PPO | Admitting: Nurse Practitioner

## 2023-02-27 NOTE — Transitions of Care (Post Inpatient/ED Visit) (Signed)
   02/27/2023  Name: Terry Winters MRN: [id number] DOB: 1934/12/28  Today's TOC FU Call Status: Today's TOC FU Call Status:: Unsuccessful Call (1st Attempt) Unsuccessful Call (1st Attempt) Date: 02/27/23  Attempted to reach the patient regarding the most recent Inpatient/ED visit.  Follow Up Plan: Additional outreach attempts will be made to reach the patient to complete the Transitions of Care (Post Inpatient/ED visit) call.   Irving Shows [facility], BSN RN Care Manager/ Transition of Care [place]/ [facility] [phone]

## 2023-02-28 ENCOUNTER — Telehealth: Payer: Self-pay | Admitting: Family Medicine

## 2023-02-28 ENCOUNTER — Ambulatory Visit: Payer: Self-pay | Admitting: Family Medicine

## 2023-02-28 ENCOUNTER — Telehealth: Payer: Self-pay

## 2023-02-28 ENCOUNTER — Inpatient Hospital Stay: Payer: Medicare PPO | Admitting: Family Medicine

## 2023-02-28 ENCOUNTER — Encounter: Payer: Self-pay | Admitting: Family Medicine

## 2023-02-28 NOTE — Transitions of Care (Post Inpatient/ED Visit) (Signed)
   02/28/2023  Name: Terry Winters MRN: [id number] DOB: 10/08/1934  Today's TOC FU Call Status: Today's TOC FU Call Status:: Unsuccessful Call (2nd Attempt) Unsuccessful Call (2nd Attempt) Date: 02/28/23  Attempted to reach the patient regarding the most recent Inpatient/ED visit.  Follow Up Plan: Additional outreach attempts will be made to reach the patient to complete the Transitions of Care (Post Inpatient/ED visit) call.    Lonia Chimera, RN, BSN, CEN [employer]- Transition of Care Team.  [facility] [phone]

## 2023-02-28 NOTE — Telephone Encounter (Signed)
   Copied from CRM [id number]. Topic: Clinical - Medical Advice >> Feb 28, 2023  4:56 PM Clayton Bibles wrote: Reason for CRM: [place] and his wife wants a nurse to call them about the hospital discharge summary dated 02/24/23. They have questions about his medications and if her had a UTI Answer Assessment - Initial Assessment Questions 1. REASON FOR CALL or QUESTION: "What is your reason for calling today?" or "How can I best help you?" or "What question do you have that I can help answer?"     Patient's wife called and wanted to confirm which medications were listed on his recent hospitalization discharge paperwork. Reviewed medication with wife and confirmed follow up appointment was scheduled. No further needs at this time.  Protocols used: Information Only Call - No Triage-A-AH

## 2023-02-28 NOTE — Telephone Encounter (Addendum)
Tried calling home and cell. Home number rang multiple times without an answer and no voicemail.  The cell number went straight to vmail that said it was not set up.  Scheduled for Monday at 3:55pm. Will cancel if pt does not call before lunch on Monday.

## 2023-03-03 ENCOUNTER — Ambulatory Visit: Payer: Medicare PPO | Admitting: Family Medicine

## 2023-03-03 ENCOUNTER — Encounter: Payer: Self-pay | Admitting: Family Medicine

## 2023-03-03 ENCOUNTER — Telehealth: Payer: Self-pay | Admitting: *Deleted

## 2023-03-03 VITALS — BP 129/69 | HR 86 | Ht 67.0 in | Wt 173.0 lb

## 2023-03-03 DIAGNOSIS — R410 Disorientation, unspecified: Secondary | ICD-10-CM | POA: Diagnosis not present

## 2023-03-03 DIAGNOSIS — M62441 Contracture of muscle, right hand: Secondary | ICD-10-CM

## 2023-03-03 DIAGNOSIS — T796XXD Traumatic ischemia of muscle, subsequent encounter: Secondary | ICD-10-CM | POA: Diagnosis not present

## 2023-03-03 LAB — CBC WITH DIFFERENTIAL/PLATELET

## 2023-03-03 NOTE — Progress Notes (Signed)
BP 129/69   Pulse 86   Ht 5\' 7"  (1.702 m)   Wt 173 lb (78.5 kg)   SpO2 97%   BMI 27.10 kg/m    Subjective:   Patient ID: Terry Winters, male    DOB: [birth year], 89 y.o.   MRN: [id number]  HPI: Terry Winters is a 89 y.o. male presenting on 03/03/2023 for Hospitalization Follow-up   HPI Transition of care phone call was performed on 03/03/2023 by Irving Shows, [facility] follow-up Patient is coming in for hospital follow-up.  He was admitted on 02/24/2023 and discharged on 02/26/2023 from [facility].  Patient had fallen 3 or 4 days before and slipped on the snow fall and was out there for a little bit before he was found.  He was having confusion and disorientation and that is why his wife to come in.  He is still having some confusion and that he will just keep talking and talking and talking and his wife says he will wake up in the morning and talk and then just continue to talk talk talk talk talk.  It is coherent talking but she says she has to tell him to stop at 11:30 PM because he will wake up at 7 and keep doing it.  He did have a fall in the snow that ended up causing him to have rhabdomyolysis because he was down on the ground for so long and has ended up with some blisters on the end of his fingers and between his thumb and the webbing on his thumb.  He is also ended up with some abrasions on both of his knees.  He denies any joint issues specifically except for his right hand which he has difficulty getting full range of motion and strength in it but that has been going on from before.  During the hospitalization he had an MRI that showed no signs of stroke and also had blood work and urinalysis.  Besides the rhabdomyolysis they did not find any signs of infection.  He has also had increased saliva especially worse after eating but they have noticed is increased over the past week since he had this event.  Relevant past medical, surgical, family and social history reviewed and updated  as indicated. Interim medical history since our last visit reviewed. Allergies and medications reviewed and updated.  Review of Systems  Constitutional:  Negative for chills and fever.  Eyes:  Negative for visual disturbance.  Respiratory:  Negative for shortness of breath and wheezing.   Cardiovascular:  Negative for chest pain and leg swelling.  Musculoskeletal:  Negative for back pain, myalgias and neck pain.  Skin:  Negative for rash.  Neurological:  Negative for dizziness and light-headedness.  Psychiatric/Behavioral:  Positive for confusion. The patient is hyperactive.   All other systems reviewed and are negative.   Per HPI unless specifically indicated above   Allergies as of 03/03/2023       Reactions   Amoxicillin Rash   Prednisone Rash   Sulfa Antibiotics Other (See Comments)   "upset stomach"   Triamcinolone Rash        Medication List        Accurate as of March 03, 2023  4:51 PM. If you have any questions, ask your nurse or doctor.          STOP taking these medications    aspirin 81 MG tablet Stopped by: Elige Radon Jaelee Laughter   fish oil-omega-3 fatty acids 1000 MG capsule  Stopped by: Elige Radon Paizlie Klaus       TAKE these medications    amLODipine 2.5 MG tablet Commonly known as: NORVASC TAKE 1 TABLET BY MOUTH EVERY DAY   atorvastatin 20 MG tablet Commonly known as: LIPITOR TAKE 1 TABLET EVERY DAY AT 6 PM   finasteride 5 MG tablet Commonly known as: PROSCAR TAKE 1 TABLET BY MOUTH EVERY DAY IN THE MORNING   fluticasone 50 MCG/ACT nasal spray Commonly known as: FLONASE Place 2 sprays into both nostrils as needed for allergies or rhinitis.   naproxen 375 MG tablet Commonly known as: NAPROSYN TAKE 1 TABLET (375 MG TOTAL) 2 (TWO) TIMES DAILY WITH A MEAL.   ZINC PO Take 1 tablet by mouth once a week.   zolpidem 5 MG tablet Commonly known as: AMBIEN TAKE 1 TABLET BY MOUTH AT BEDTIME AS NEEDED FOR SLEEP.         Objective:   BP  129/69   Pulse 86   Ht 5\' 7"  (1.702 m)   Wt 173 lb (78.5 kg)   SpO2 97%   BMI 27.10 kg/m   Wt Readings from Last 3 Encounters:  03/03/23 173 lb (78.5 kg)  10/03/22 188 lb (85.3 kg)  07/23/22 184 lb (83.5 kg)    Physical Exam Vitals and nursing note reviewed.  Constitutional:      General: He is not in acute distress.    Appearance: He is well-developed. He is not diaphoretic.  Eyes:     General: No scleral icterus.    Conjunctiva/sclera: Conjunctivae normal.  Neck:     Thyroid: No thyromegaly.  Cardiovascular:     Rate and Rhythm: Normal rate and regular rhythm.     Heart sounds: Normal heart sounds. No murmur heard. Pulmonary:     Effort: Pulmonary effort is normal. No respiratory distress.     Breath sounds: Normal breath sounds. No wheezing.  Musculoskeletal:     Cervical back: Neck supple.  Lymphadenopathy:     Cervical: No cervical adenopathy.  Skin:    General: Skin is warm and dry.     Findings: Wound (Healing wounds on all the tips of his fingers on his left hand and then some open spots on the webbing of his thumb and) present. No rash.  Neurological:     Mental Status: He is alert and oriented to person, place, and time.     Coordination: Coordination normal.  Psychiatric:        Behavior: Behavior normal.       Assessment & Plan:   Problem List Items Addressed This Visit   None Visit Diagnoses       Confusion and disorientation    -  Primary   Relevant Orders   CBC with Differential/Platelet   CMP14+EGFR   CK   Ambulatory referral to Neurology     Traumatic rhabdomyolysis, subsequent encounter         Contracture of muscle of right hand       Relevant Orders   Ambulatory referral to Physical Therapy       Do blood work today and will also do referral to neurology because of his confusion.  Recheck renal function and CBC and CK due to rhabdo to make sure that he is still recovering.  Encouraged hydration Follow up plan: Return if symptoms  worsen or fail to improve.  Counseling provided for all of the vaccine components Orders Placed This Encounter  Procedures   CBC with Differential/Platelet  CMP14+EGFR   CK   Ambulatory referral to Neurology   Ambulatory referral to Physical Therapy    Arville Care, MD [facility] 03/03/2023, 4:51 PM

## 2023-03-03 NOTE — Transitions of Care (Post Inpatient/ED Visit) (Signed)
03/03/2023  Name: Terry Winters MRN: [id number] DOB: 27-Sep-1934  Today's TOC FU Call Status: Today's TOC FU Call Status:: Successful TOC FU Call Completed TOC FU Call Complete Date: 03/03/23 Patient's Name and Date of Birth confirmed.  Transition Care Management Follow-up Telephone Call Date of Discharge: 02/26/23 Discharge Facility: Other [facility]) Name of Other (Non[facility]) Discharge Facility: [facility] Type of Discharge: Inpatient Admission Primary Inpatient Discharge Diagnosis:: Altered Mental Status How have you been since you were released from the hospital?:  (pt eating well, no issues w/ bowel/ bladder,  has meds taking as prescribed, mental status varies day to day, per spouse " he can remember things, he talks alot and rambles" will discuss w/ PCP today, being careful "not to fall") Any questions or concerns?: No  Items Reviewed: Did you receive and understand the discharge instructions provided?: Yes Medications obtained,verified, and reconciled?: Yes (Medications Reviewed) Any new allergies since your discharge?: No Dietary orders reviewed?: Yes Type of Diet Ordered:: heart healthy Do you have support at home?: Yes People in Home: spouse Name of Support/Comfort Primary Source: spouse Reviewed safety/ fall precautions Spouse declines any further follow up, declines 30 day program RN Care Manager provided contact name and number  Medications Reviewed Today: Medications Reviewed Today     Reviewed by Audrie Gallus, RN (Registered Nurse) on 03/03/23 at 1333  Med List Status: <None>   Medication Order Taking? Sig Documenting Provider Last Dose Status Informant  amLODipine (NORVASC) 2.5 MG tablet [id number] Yes TAKE 1 TABLET BY MOUTH EVERY DAY Dettinger, Elige Radon, MD Taking Active   aspirin 81 MG tablet [id number] No Take 81 mg by mouth every morning.  Patient not taking: Reported on 03/03/2023   [provider] Not Taking Active Self  atorvastatin  (LIPITOR) 20 MG tablet [id number] Yes TAKE 1 TABLET EVERY DAY AT 6 PM Dettinger, Elige Radon, MD Taking Active   finasteride (PROSCAR) 5 MG tablet [id number] Yes TAKE 1 TABLET BY MOUTH EVERY DAY IN THE MORNING Dettinger, Elige Radon, MD Taking Active   fish oil-omega-3 fatty acids 1000 MG capsule [id number] No Take 1 g by mouth daily. 5 days per week  Patient not taking: Reported on 03/03/2023   [provider] Not Taking Active Self  fluticasone (FLONASE) 50 MCG/ACT nasal spray [id number] Yes Place 2 sprays into both nostrils as needed for allergies or rhinitis. Dettinger, Elige Radon, MD Taking Active   Multiple Vitamins-Minerals (ZINC PO) [id number] Yes Take 1 tablet by mouth once a week.  [provider] Taking Active   naproxen (NAPROSYN) 375 MG tablet [id number] Yes TAKE 1 TABLET (375 MG TOTAL) 2 (TWO) TIMES DAILY WITH A MEAL. Dettinger, Elige Radon, MD Taking Active   zolpidem (AMBIEN) 5 MG tablet [id number] No TAKE 1 TABLET BY MOUTH AT BEDTIME AS NEEDED FOR SLEEP.  Patient not taking: Reported on 03/03/2023   Dettinger, Elige Radon, MD Not Taking Active             Home Care and Equipment/Supplies: Were Home Health Services Ordered?: No Any new equipment or medical supplies ordered?: No  Functional Questionnaire: Do you need assistance with bathing/showering or dressing?: Yes (shower seat) Do you need assistance with meal preparation?: Yes (spouse provides oversight) Do you need assistance with eating?: No Do you have difficulty maintaining continence: No Do you need assistance with getting out of bed/getting out of a chair/moving?: No Do you have difficulty managing or taking your medications?: Yes (spouse provides oversight)  Follow up appointments  reviewed: PCP Follow-up appointment confirmed?: Yes Date of PCP follow-up appointment?: 03/03/23 Follow-up Provider: Dr. Louanne Skye  @ 355 pm (spouse missed appts on 1/16 & 1/17 but will see PCP today 1/20) Specialist Hospital Follow-up  appointment confirmed?: No Reason Specialist Follow-Up Not Confirmed: Patient has Specialist Provider Number and will Call for Appointment Do you need transportation to your follow-up appointment?: No Do you understand care options if your condition(s) worsen?: Yes-patient verbalized understanding  SDOH Interventions Today    Flowsheet Row Most Recent Value  SDOH Interventions   Food Insecurity Interventions Intervention Not Indicated  Housing Interventions Intervention Not Indicated  Transportation Interventions Intervention Not Indicated  Utilities Interventions Intervention Not Indicated       Irving Shows [facility], BSN RN Care Manager/ Transition of Care / [facility] [phone]

## 2023-03-03 NOTE — Telephone Encounter (Signed)
Pt seen in office today for HFU

## 2023-03-04 LAB — CBC WITH DIFFERENTIAL/PLATELET
Basos: 1 %
EOS (ABSOLUTE): 0.1 10*3/uL (ref 0.0–0.2)
Eos: 2 %
Hematocrit: 46.6 % (ref 37.5–51.0)
Hemoglobin: 15.7 g/dL (ref 13.0–17.7)
Immature Granulocytes: 0 %
Immature Granulocytes: 0 10*3/uL (ref 0.0–0.1)
Lymphs: 20 %
MCH: 29 pg (ref 26.6–33.0)
MCHC: 33.7 g/dL (ref 31.5–35.7)
MCV: 86 fL (ref 79–97)
Monocytes Absolute: 0.2 10*3/uL (ref 0.0–0.4)
Monocytes Absolute: 1.2 10*3/uL — ABNORMAL HIGH (ref 0.1–0.9)
Monocytes: 11 %
Neutrophils Absolute: 2.3 10*3/uL (ref 0.7–3.1)
Neutrophils Absolute: 7.8 10*3/uL — ABNORMAL HIGH (ref 1.4–7.0)
Neutrophils: 66 %
Platelets: 376 10*3/uL (ref 150–450)
RBC: 5.42 x10E6/uL (ref 4.14–5.80)
RDW: 12.3 % (ref 11.6–15.4)
WBC: 11.6 10*3/uL — ABNORMAL HIGH (ref 3.4–10.8)

## 2023-03-04 LAB — CMP14+EGFR
ALT: 48 IU/L — ABNORMAL HIGH (ref 0–44)
AST: 35 IU/L (ref 0–40)
Albumin: 4.2 g/dL (ref 3.7–4.7)
Alkaline Phosphatase: 111 IU/L (ref 44–121)
BUN/Creatinine Ratio: 16 (ref 10–24)
BUN: 15 mg/dL (ref 8–27)
Bilirubin Total: 0.5 mg/dL (ref 0.0–1.2)
CO2: 24 mmol/L (ref 20–29)
Calcium: 9.2 mg/dL (ref 8.6–10.2)
Chloride: 98 mmol/L (ref 96–106)
Creatinine, Ser: 0.94 mg/dL (ref 0.76–1.27)
Globulin, Total: 2.4 g/dL (ref 1.5–4.5)
Glucose: 115 mg/dL — ABNORMAL HIGH (ref 70–99)
Potassium: 4.5 mmol/L (ref 3.5–5.2)
Sodium: 135 mmol/L (ref 134–144)
Total Protein: 6.6 g/dL (ref 6.0–8.5)
eGFR: 79 mL/min/{1.73_m2} (ref >59)

## 2023-03-04 LAB — CK: Total CK: 375 U/L — ABNORMAL HIGH (ref 30–208)

## 2023-03-07 ENCOUNTER — Inpatient Hospital Stay: Payer: Medicare PPO

## 2023-03-12 DIAGNOSIS — M25541 Pain in joints of right hand: Secondary | ICD-10-CM | POA: Diagnosis not present

## 2023-03-12 DIAGNOSIS — M6281 Muscle weakness (generalized): Secondary | ICD-10-CM | POA: Diagnosis not present

## 2023-03-12 DIAGNOSIS — M25641 Stiffness of right hand, not elsewhere classified: Secondary | ICD-10-CM | POA: Diagnosis not present

## 2023-03-14 ENCOUNTER — Telehealth: Payer: Self-pay | Admitting: Family Medicine

## 2023-03-14 DIAGNOSIS — T796XXD Traumatic ischemia of muscle, subsequent encounter: Secondary | ICD-10-CM

## 2023-03-14 NOTE — Telephone Encounter (Unsigned)
Copied from CRM [id number]. Topic: Referral - Question >> Mar 13, 2023  1:18 PM Ivette P wrote: Reason for CRM: Marchelle Folks, therapist from [facility] called in to state they have not received a referral to be abel to see the pt and is requesting for referral to be sent. Fax Number : [phone]  Amanda's callback Number [phone]

## 2023-03-17 NOTE — Telephone Encounter (Signed)
 Referral placed.

## 2023-03-17 NOTE — Telephone Encounter (Signed)
Per PT Referral Notes from [facility] - They state Patient needs Occupational Therapy, If so please place a Referral for OT.   Please Advise.

## 2023-03-17 NOTE — Telephone Encounter (Signed)
Please place a referral for occupational therapy to the place that they said which [facility] so they can treat the patient.

## 2023-03-19 DIAGNOSIS — M25541 Pain in joints of right hand: Secondary | ICD-10-CM | POA: Diagnosis not present

## 2023-03-19 DIAGNOSIS — M6281 Muscle weakness (generalized): Secondary | ICD-10-CM | POA: Diagnosis not present

## 2023-03-19 DIAGNOSIS — M25641 Stiffness of right hand, not elsewhere classified: Secondary | ICD-10-CM | POA: Diagnosis not present

## 2023-03-24 DIAGNOSIS — M25541 Pain in joints of right hand: Secondary | ICD-10-CM | POA: Diagnosis not present

## 2023-03-24 DIAGNOSIS — M25641 Stiffness of right hand, not elsewhere classified: Secondary | ICD-10-CM | POA: Diagnosis not present

## 2023-03-24 DIAGNOSIS — M6281 Muscle weakness (generalized): Secondary | ICD-10-CM | POA: Diagnosis not present

## 2023-03-26 DIAGNOSIS — M6281 Muscle weakness (generalized): Secondary | ICD-10-CM | POA: Diagnosis not present

## 2023-03-26 DIAGNOSIS — M25641 Stiffness of right hand, not elsewhere classified: Secondary | ICD-10-CM | POA: Diagnosis not present

## 2023-03-26 DIAGNOSIS — M25541 Pain in joints of right hand: Secondary | ICD-10-CM | POA: Diagnosis not present

## 2023-04-03 ENCOUNTER — Ambulatory Visit: Payer: Medicare PPO | Admitting: Family Medicine

## 2023-04-04 ENCOUNTER — Encounter: Payer: Self-pay | Admitting: Family Medicine

## 2023-04-04 ENCOUNTER — Ambulatory Visit (INDEPENDENT_AMBULATORY_CARE_PROVIDER_SITE_OTHER): Payer: Medicare PPO | Admitting: Family Medicine

## 2023-04-04 ENCOUNTER — Ambulatory Visit: Payer: Medicare PPO | Admitting: Family Medicine

## 2023-04-04 VITALS — BP 136/64 | HR 82 | Ht 67.0 in | Wt 180.0 lb

## 2023-04-04 DIAGNOSIS — I10 Essential (primary) hypertension: Secondary | ICD-10-CM

## 2023-04-04 DIAGNOSIS — M6281 Muscle weakness (generalized): Secondary | ICD-10-CM | POA: Diagnosis not present

## 2023-04-04 DIAGNOSIS — M25641 Stiffness of right hand, not elsewhere classified: Secondary | ICD-10-CM | POA: Diagnosis not present

## 2023-04-04 DIAGNOSIS — E782 Mixed hyperlipidemia: Secondary | ICD-10-CM | POA: Diagnosis not present

## 2023-04-04 DIAGNOSIS — R7303 Prediabetes: Secondary | ICD-10-CM

## 2023-04-04 DIAGNOSIS — M25541 Pain in joints of right hand: Secondary | ICD-10-CM | POA: Diagnosis not present

## 2023-04-04 LAB — BAYER DCA HB A1C WAIVED: HB A1C (BAYER DCA - WAIVED): 6 % — ABNORMAL HIGH (ref 4.8–5.6)

## 2023-04-04 MED ORDER — ATORVASTATIN CALCIUM 20 MG PO TABS: ORAL_TABLET | ORAL | 3 refills | Status: AC

## 2023-04-04 MED ORDER — AMLODIPINE BESYLATE 2.5 MG PO TABS: 2.5000 mg | ORAL_TABLET | Freq: Every day | ORAL | 3 refills | Status: DC

## 2023-04-04 NOTE — Progress Notes (Signed)
BP 136/64   Pulse 82   Ht 5\' 7"  (1.702 m)   Wt 180 lb (81.6 kg)   SpO2 97%   BMI 28.19 kg/m    Subjective:   Patient ID: Terry Winters, male    DOB: 1934-04-20, 89 y.o.   MRN: [id number]  HPI: Terry Winters is a 89 y.o. male presenting on 04/04/2023 for Medical Management of Chronic Issues, Hypertension, Hyperlipidemia, and Insomnia   HPI Prediabetes Patient comes in today for recheck of his diabetes. Patient has been currently taking no medication currently. Patient is not currently on an ACE inhibitor/ARB. Patient has not seen an ophthalmologist this year. Patient denies any new issues with their feet. The symptom started onset as an adult hypertension and her blood ARE RELATED TO DM   Hyperlipidemia Patient is coming in for recheck of his hyperlipidemia. The patient is currently taking atorvastatin. They deny any issues with myalgias or history of liver damage from it. They deny any focal numbness or weakness or chest pain.   Hypertension Patient is currently on amlodipine, and their blood pressure today is 136/64. Patient denies any lightheadedness or dizziness. Patient denies headaches, blurred vision, chest pains, shortness of breath, or weakness. Denies any side effects from medication and is content with current medication.   Relevant past medical, surgical, family and social history reviewed and updated as indicated. Interim medical history since our last visit reviewed. Allergies and medications reviewed and updated.  Review of Systems  Constitutional:  Negative for chills and fever.  Eyes:  Negative for visual disturbance.  Respiratory:  Negative for shortness of breath and wheezing.   Cardiovascular:  Negative for chest pain and leg swelling.  Skin:  Negative for rash.  Neurological:  Negative for dizziness and light-headedness.  Psychiatric/Behavioral:  Positive for sleep disturbance.   All other systems reviewed and are negative.   Per HPI unless specifically  indicated above   Allergies as of 04/04/2023       Reactions   Amoxicillin Rash   Prednisone Rash   Sulfa Antibiotics Other (See Comments)   "upset stomach"   Triamcinolone Rash        Medication List        Accurate as of April 04, 2023  1:25 PM. If you have any questions, ask your nurse or doctor.          amLODipine 2.5 MG tablet Commonly known as: NORVASC Take 1 tablet (2.5 mg total) by mouth daily.   atorvastatin 20 MG tablet Commonly known as: LIPITOR TAKE 1 TABLET EVERY DAY AT 6 PM   finasteride 5 MG tablet Commonly known as: PROSCAR TAKE 1 TABLET BY MOUTH EVERY DAY IN THE MORNING   fluticasone 50 MCG/ACT nasal spray Commonly known as: FLONASE Place 2 sprays into both nostrils as needed for allergies or rhinitis.   naproxen 375 MG tablet Commonly known as: NAPROSYN TAKE 1 TABLET (375 MG TOTAL) 2 (TWO) TIMES DAILY WITH A MEAL.   ZINC PO Take 1 tablet by mouth once a week.   zolpidem 5 MG tablet Commonly known as: AMBIEN TAKE 1 TABLET BY MOUTH AT BEDTIME AS NEEDED FOR SLEEP.         Objective:   BP 136/64   Pulse 82   Ht 5\' 7"  (1.702 m)   Wt 180 lb (81.6 kg)   SpO2 97%   BMI 28.19 kg/m   Wt Readings from Last 3 Encounters:  04/04/23 180 lb (81.6 kg)  03/03/23 173 lb (  78.5 kg)  10/03/22 188 lb (85.3 kg)    Physical Exam Vitals and nursing note reviewed.  Constitutional:      General: He is not in acute distress.    Appearance: He is well-developed. He is not diaphoretic.  Eyes:     General: No scleral icterus.    Conjunctiva/sclera: Conjunctivae normal.  Neck:     Thyroid: No thyromegaly.  Cardiovascular:     Rate and Rhythm: Normal rate and regular rhythm.     Heart sounds: Normal heart sounds. No murmur heard. Pulmonary:     Effort: Pulmonary effort is normal. No respiratory distress.     Breath sounds: Normal breath sounds. No wheezing.  Musculoskeletal:        General: No swelling. Normal range of motion.     Cervical  back: Neck supple.  Lymphadenopathy:     Cervical: No cervical adenopathy.  Skin:    General: Skin is warm and dry.     Findings: No rash.  Neurological:     Mental Status: He is alert and oriented to person, place, and time.     Coordination: Coordination normal.  Psychiatric:        Behavior: Behavior normal.       Assessment & Plan:   Problem List Items Addressed This Visit       Cardiovascular and Mediastinum   HTN (hypertension) - Primary   Relevant Medications   amLODipine (NORVASC) 2.5 MG tablet   atorvastatin (LIPITOR) 20 MG tablet   Other Relevant Orders   CMP14+EGFR   Lipid panel     Other   HLD (hyperlipidemia)   Relevant Medications   amLODipine (NORVASC) 2.5 MG tablet   atorvastatin (LIPITOR) 20 MG tablet   Other Relevant Orders   Lipid panel   Prediabetes   Relevant Orders   Bayer DCA Hb A1c Waived    Patient is going to do lab work on the way out.  He is currently still taking the same medication.  He does not use the Ambien as consistently.  He does not need a refill for it today. Follow up plan: Return in about 6 months (around 10/02/2023), or if symptoms worsen or fail to improve, for Hypertension and hyperlipidemia and prediabetes.  Counseling provided for all of the vaccine components Orders Placed This Encounter  Procedures   CMP14+EGFR   Lipid panel   Bayer DCA Hb A1c Waived    Arville Care, MD [facility] 04/04/2023, 1:25 PM

## 2023-04-05 LAB — CMP14+EGFR
ALT: 24 [IU]/L (ref 0–44)
AST: 25 [IU]/L (ref 0–40)
Albumin: 3.9 g/dL (ref 3.7–4.7)
Alkaline Phosphatase: 115 [IU]/L (ref 44–121)
BUN/Creatinine Ratio: 14 (ref 10–24)
BUN: 13 mg/dL (ref 8–27)
Bilirubin Total: 0.5 mg/dL (ref 0.0–1.2)
CO2: 23 mmol/L (ref 20–29)
Calcium: 9.3 mg/dL (ref 8.6–10.2)
Chloride: 99 mmol/L (ref 96–106)
Creatinine, Ser: 0.94 mg/dL (ref 0.76–1.27)
Globulin, Total: 2.2 g/dL (ref 1.5–4.5)
Glucose: 111 mg/dL — ABNORMAL HIGH (ref 70–99)
Potassium: 4.4 mmol/L (ref 3.5–5.2)
Sodium: 137 mmol/L (ref 134–144)
Total Protein: 6.1 g/dL (ref 6.0–8.5)
eGFR: 79 mL/min/{1.73_m2} (ref >59)

## 2023-04-05 LAB — LIPID PANEL
Chol/HDL Ratio: 3 {ratio} (ref 0.0–5.0)
Cholesterol, Total: 134 mg/dL (ref 100–199)
HDL: 45 mg/dL (ref >39)
LDL Chol Calc (NIH): 64 mg/dL (ref 0–99)
Triglycerides: 147 mg/dL (ref 0–149)
VLDL Cholesterol Cal: 25 mg/dL (ref 5–40)

## 2023-04-07 DIAGNOSIS — M6281 Muscle weakness (generalized): Secondary | ICD-10-CM | POA: Diagnosis not present

## 2023-04-07 DIAGNOSIS — M25641 Stiffness of right hand, not elsewhere classified: Secondary | ICD-10-CM | POA: Diagnosis not present

## 2023-04-07 DIAGNOSIS — M25541 Pain in joints of right hand: Secondary | ICD-10-CM | POA: Diagnosis not present

## 2023-04-09 ENCOUNTER — Telehealth: Payer: Self-pay | Admitting: Neurology

## 2023-04-09 ENCOUNTER — Encounter: Payer: Self-pay | Admitting: Neurology

## 2023-04-09 ENCOUNTER — Ambulatory Visit: Payer: Medicare PPO | Admitting: Neurology

## 2023-04-09 NOTE — Telephone Encounter (Signed)
 Pt's wife called missed appointment because could not find [facility].

## 2023-04-14 DIAGNOSIS — M25541 Pain in joints of right hand: Secondary | ICD-10-CM | POA: Diagnosis not present

## 2023-04-14 DIAGNOSIS — M25641 Stiffness of right hand, not elsewhere classified: Secondary | ICD-10-CM | POA: Diagnosis not present

## 2023-04-14 DIAGNOSIS — M6281 Muscle weakness (generalized): Secondary | ICD-10-CM | POA: Diagnosis not present

## 2023-04-16 DIAGNOSIS — M25641 Stiffness of right hand, not elsewhere classified: Secondary | ICD-10-CM | POA: Diagnosis not present

## 2023-04-16 DIAGNOSIS — M25541 Pain in joints of right hand: Secondary | ICD-10-CM | POA: Diagnosis not present

## 2023-04-16 DIAGNOSIS — M6281 Muscle weakness (generalized): Secondary | ICD-10-CM | POA: Diagnosis not present

## 2023-04-23 DIAGNOSIS — M25641 Stiffness of right hand, not elsewhere classified: Secondary | ICD-10-CM | POA: Diagnosis not present

## 2023-04-23 DIAGNOSIS — M6281 Muscle weakness (generalized): Secondary | ICD-10-CM | POA: Diagnosis not present

## 2023-04-23 DIAGNOSIS — M25541 Pain in joints of right hand: Secondary | ICD-10-CM | POA: Diagnosis not present

## 2023-04-25 DIAGNOSIS — M25541 Pain in joints of right hand: Secondary | ICD-10-CM | POA: Diagnosis not present

## 2023-04-25 DIAGNOSIS — M6281 Muscle weakness (generalized): Secondary | ICD-10-CM | POA: Diagnosis not present

## 2023-04-25 DIAGNOSIS — M25641 Stiffness of right hand, not elsewhere classified: Secondary | ICD-10-CM | POA: Diagnosis not present

## 2023-05-03 ENCOUNTER — Other Ambulatory Visit: Payer: Self-pay | Admitting: Family Medicine

## 2023-05-03 DIAGNOSIS — G8929 Other chronic pain: Secondary | ICD-10-CM

## 2023-05-05 DIAGNOSIS — M25641 Stiffness of right hand, not elsewhere classified: Secondary | ICD-10-CM | POA: Diagnosis not present

## 2023-05-05 DIAGNOSIS — M6281 Muscle weakness (generalized): Secondary | ICD-10-CM | POA: Diagnosis not present

## 2023-05-05 DIAGNOSIS — M25541 Pain in joints of right hand: Secondary | ICD-10-CM | POA: Diagnosis not present

## 2023-05-12 DIAGNOSIS — M6281 Muscle weakness (generalized): Secondary | ICD-10-CM | POA: Diagnosis not present

## 2023-05-12 DIAGNOSIS — M25641 Stiffness of right hand, not elsewhere classified: Secondary | ICD-10-CM | POA: Diagnosis not present

## 2023-05-12 DIAGNOSIS — M25541 Pain in joints of right hand: Secondary | ICD-10-CM | POA: Diagnosis not present

## 2023-05-13 NOTE — Progress Notes (Deleted)
 [facility]  PATIENT: Terry Winters DOB: 10/10/1934  REFERRING DOCTOR OR PCP:  *** SOURCE: ***  _________________________________   HISTORICAL  CHIEF COMPLAINT:  No chief complaint on file.   HISTORY OF PRESENT ILLNESS:  ***  Imaging: MRI of the head 02/24/2023 shows minimal generalized cortical atrophy, likely normal for age.  There are scattered T2/FLAIR hyperintense foci in the hemispheres, pons and cerebellum consistent with moderate chronic microvascular ischemic change.  A moderate-sized focus is noted involving the right middle cerebellar peduncle.  REVIEW OF SYSTEMS: Constitutional: No fevers, chills, sweats, or change in appetite Eyes: No visual changes, double vision, eye pain Ear, nose and throat: No hearing loss, ear pain, nasal congestion, sore throat Cardiovascular: No chest pain, palpitations Respiratory:  No shortness of breath at rest or with exertion.   No wheezes GastrointestinaI: No nausea, vomiting, diarrhea, abdominal pain, fecal incontinence Genitourinary:  No dysuria, urinary retention or frequency.  No nocturia. Musculoskeletal:  No neck pain, back pain Integumentary: No rash, pruritus, skin lesions Neurological: as above Psychiatric: No depression at this time.  No anxiety Endocrine: No palpitations, diaphoresis, change in appetite, change in weigh or increased thirst Hematologic/Lymphatic:  No anemia, purpura, petechiae. Allergic/Immunologic: No itchy/runny eyes, nasal congestion, recent allergic reactions, rashes  ALLERGIES: Allergies  Allergen Reactions   Amoxicillin Rash   Prednisone Rash   Sulfa Antibiotics Other (See Comments)    "upset stomach"   Triamcinolone Rash    HOME MEDICATIONS:  Current Outpatient Medications:    amLODipine (NORVASC) 2.5 MG tablet, Take 1 tablet (2.5 mg total) by mouth daily., Disp: 90 tablet, Rfl: 3   atorvastatin (LIPITOR) 20 MG tablet, TAKE 1 TABLET EVERY DAY AT 6 PM, Disp: 90 tablet,  Rfl: 3   finasteride (PROSCAR) 5 MG tablet, TAKE 1 TABLET BY MOUTH EVERY DAY IN THE MORNING, Disp: 90 tablet, Rfl: 3   fluticasone (FLONASE) 50 MCG/ACT nasal spray, Place 2 sprays into both nostrils as needed for allergies or rhinitis., Disp: 48 mL, Rfl: 1   Multiple Vitamins-Minerals (ZINC PO), Take 1 tablet by mouth once a week. , Disp: , Rfl:    naproxen (NAPROSYN) 375 MG tablet, TAKE 1 TABLET TWICE DAILY WITH MEALS, Disp: 180 tablet, Rfl: 1   zolpidem (AMBIEN) 5 MG tablet, TAKE 1 TABLET BY MOUTH AT BEDTIME AS NEEDED FOR SLEEP., Disp: 30 tablet, Rfl: 5  PAST MEDICAL HISTORY: Past Medical History:  Diagnosis Date   BPH (benign prostatic hypertrophy)    Foley catheter in place    GERD (gastroesophageal reflux disease)    watches diet   History of gout    as of 11-12-2016  per pt stable   Hyperlipidemia    Hypertension    per pt stopped his medication losartan on his own--- last taken 2nd week sept. 2018--- per pt checks bp and has been ok and watches diet   Seasonal allergies    Type 2 diabetes, diet controlled (HCC)    Urinary retention    Vitamin D deficiency    Wears glasses    Wears partial dentures    upper    PAST SURGICAL HISTORY: Past Surgical History:  Procedure Laterality Date   ANTERIOR CERVICAL DECOMP/DISCECTOMY FUSION  1993   TONSILLECTOMY  child   TRANSURETHRAL RESECTION OF PROSTATE N/A 11/14/2016   Procedure: TRANSURETHRAL RESECTION OF THE PROSTATE (TURP);  Surgeon: Bjorn Pippin, MD;  Location: [facility];  Service: Urology;  Laterality: N/A;    FAMILY HISTORY: Family History  Problem Relation Age of Onset   ALS Father     SOCIAL HISTORY: Social History   Socioeconomic History   Marital status: Married    Spouse name: Lucendia Herrlich   Number of children: 3   Years of education: Not on file   Highest education level: Not on file  Occupational History   Occupation: retired  Tobacco Use   Smoking status: Former    Current packs/day: 0.00     Average packs/day: 0.5 packs/day for 15.0 years (7.5 ttl pk-yrs)    Types: Cigars, Cigarettes    Start date: 06/16/1951    Quit date: 06/16/1966    Years since quitting: 56.9   Smokeless tobacco: Never  Vaping Use   Vaping status: Never Used  Substance and Sexual Activity   Alcohol use: Yes    Comment: occasionally   Drug use: No   Sexual activity: Not on file  Other Topics Concern   Not on file  Social History Narrative   07/20/2021 - Lives home with wife. Enjoys playing golf. Cuts hay, mows, lots of yard work, very active   Teenage grandsons stay with them a lot of weekends   Social Drivers of Corporate investment banker Strain: Low Risk  (07/23/2022)   Overall Financial Resource Strain (CARDIA)    Difficulty of Paying Living Expenses: Not hard at all  Food Insecurity: No Food Insecurity (03/03/2023)   Hunger Vital Sign    Worried About Running Out of Food in the Last Year: Never true    Ran Out of Food in the Last Year: Never true  Transportation Needs: No Transportation Needs (03/03/2023)   PRAPARE - Administrator, Civil Service (Medical): No    Lack of Transportation (Non-Medical): No  Physical Activity: Insufficiently Active (07/23/2022)   Exercise Vital Sign    Days of Exercise per Week: 3 days    Minutes of Exercise per Session: 30 min  Stress: No Stress Concern Present (07/23/2022)   [employer] of Occupational Health - Occupational Stress Questionnaire    Feeling of Stress : Not at all  Social Connections: Socially Integrated (07/23/2022)   Social Connection and Isolation Panel [NHANES]    Frequency of Communication with Friends and Family: More than three times a week    Frequency of Social Gatherings with Friends and Family: More than three times a week    Attends Religious Services: More than 4 times per year    Active Member of [employer] or Organizations: Yes    Attends Engineer, structural: More than 4 times per year    Marital Status: Married   Catering manager Violence: Not At Risk (03/03/2023)   Humiliation, Afraid, Rape, and Kick questionnaire    Fear of Current or Ex-Partner: No    Emotionally Abused: No    Physically Abused: No    Sexually Abused: No       PHYSICAL EXAM  There were no vitals filed for this visit.  There is no height or weight on file to calculate BMI.   General: The patient is well-developed and well-nourished and in no acute distress  HEENT:  Head is [place]/AT.  Sclera are anicteric.  Funduscopic exam shows normal optic discs and retinal vessels.  Neck: No carotid bruits are noted.  The neck is nontender.  Cardiovascular: The heart has a regular rate and rhythm with a normal S1 and S2. There were no murmurs, gallops or rubs.    Skin: Extremities are without rash or  edema.  Musculoskeletal:  Back is nontender  Neurologic Exam  Mental status: The patient is alert and oriented x 3 at the time of the examination. The patient has apparent normal recent and remote memory, with an apparently normal attention span and concentration ability.   Speech is normal.  Cranial nerves: Extraocular movements are full. Pupils are equal, round, and reactive to light and accomodation.  Visual fields are full.  Facial symmetry is present. There is good facial sensation to soft touch bilaterally.Facial strength is normal.  Trapezius and sternocleidomastoid strength is normal. No dysarthria is noted.  The tongue is midline, and the patient has symmetric elevation of the soft palate. No obvious hearing deficits are noted.  Motor:  Muscle bulk is normal.   Tone is normal. Strength is  5 / 5 in all 4 extremities.   Sensory: Sensory testing is intact to pinprick, soft touch and vibration sensation in all 4 extremities.  Coordination: Cerebellar testing reveals good finger-nose-finger and heel-to-shin bilaterally.  Gait and station: Station is normal.   Gait is normal. Tandem gait is normal. Romberg is negative.    Reflexes: Deep tendon reflexes are symmetric and normal bilaterally.   Plantar responses are flexor.    DIAGNOSTIC DATA (LABS, IMAGING, TESTING) - I reviewed patient records, labs, notes, testing and imaging myself where available.  Lab Results  Component Value Date   WBC 11.6 (H) 03/03/2023   HGB 15.7 03/03/2023   HCT 46.6 03/03/2023   MCV 86 03/03/2023   PLT 376 03/03/2023      Component Value Date/Time   NA 137 04/04/2023 1323   K 4.4 04/04/2023 1323   CL 99 04/04/2023 1323   CO2 23 04/04/2023 1323   GLUCOSE 111 (H) 04/04/2023 1323   GLUCOSE 115 (H) 11/14/2016 1137   BUN 13 04/04/2023 1323   CREATININE 0.94 04/04/2023 1323   CREATININE 1.05 06/15/2012 1031   CALCIUM 9.3 04/04/2023 1323   PROT 6.1 04/04/2023 1323   ALBUMIN 3.9 04/04/2023 1323   AST 25 04/04/2023 1323   ALT 24 04/04/2023 1323   ALKPHOS 115 04/04/2023 1323   BILITOT 0.5 04/04/2023 1323   GFRNONAA 71 12/21/2019 0939   GFRNONAA 69 06/15/2012 1031   GFRAA 82 12/21/2019 0939   GFRAA 80 06/15/2012 1031   Lab Results  Component Value Date   CHOL 134 04/04/2023   HDL 45 04/04/2023   LDLCALC 64 04/04/2023   TRIG 147 04/04/2023   CHOLHDL 3.0 04/04/2023   Lab Results  Component Value Date   HGBA1C 6.0 (H) 04/04/2023   No results found for: "VITAMINB12" No results found for: "TSH"     ASSESSMENT AND PLAN  ***   Shaundrea Carrigg A. Epimenio Foot, MD, [facility] 05/13/2023, 8:32 PM Certified in Neurology, Clinical Neurophysiology, Sleep Medicine and Neuroimaging  [facility] [address], [address] [place], [place] [address] [phone]

## 2023-05-14 ENCOUNTER — Ambulatory Visit: Admitting: Neurology

## 2023-05-14 ENCOUNTER — Encounter: Payer: Self-pay | Admitting: Neurology

## 2023-05-19 DIAGNOSIS — M25641 Stiffness of right hand, not elsewhere classified: Secondary | ICD-10-CM | POA: Diagnosis not present

## 2023-05-19 DIAGNOSIS — M6281 Muscle weakness (generalized): Secondary | ICD-10-CM | POA: Diagnosis not present

## 2023-05-19 DIAGNOSIS — M25541 Pain in joints of right hand: Secondary | ICD-10-CM | POA: Diagnosis not present

## 2023-05-21 DIAGNOSIS — M25641 Stiffness of right hand, not elsewhere classified: Secondary | ICD-10-CM | POA: Diagnosis not present

## 2023-05-21 DIAGNOSIS — M6281 Muscle weakness (generalized): Secondary | ICD-10-CM | POA: Diagnosis not present

## 2023-05-21 DIAGNOSIS — M25541 Pain in joints of right hand: Secondary | ICD-10-CM | POA: Diagnosis not present

## 2023-05-26 DIAGNOSIS — M25541 Pain in joints of right hand: Secondary | ICD-10-CM | POA: Diagnosis not present

## 2023-05-26 DIAGNOSIS — M25641 Stiffness of right hand, not elsewhere classified: Secondary | ICD-10-CM | POA: Diagnosis not present

## 2023-05-26 DIAGNOSIS — M6281 Muscle weakness (generalized): Secondary | ICD-10-CM | POA: Diagnosis not present

## 2023-05-28 DIAGNOSIS — M6281 Muscle weakness (generalized): Secondary | ICD-10-CM | POA: Diagnosis not present

## 2023-05-28 DIAGNOSIS — M25541 Pain in joints of right hand: Secondary | ICD-10-CM | POA: Diagnosis not present

## 2023-05-28 DIAGNOSIS — M25641 Stiffness of right hand, not elsewhere classified: Secondary | ICD-10-CM | POA: Diagnosis not present

## 2023-06-02 DIAGNOSIS — M25541 Pain in joints of right hand: Secondary | ICD-10-CM | POA: Diagnosis not present

## 2023-06-02 DIAGNOSIS — M6281 Muscle weakness (generalized): Secondary | ICD-10-CM | POA: Diagnosis not present

## 2023-06-02 DIAGNOSIS — M25641 Stiffness of right hand, not elsewhere classified: Secondary | ICD-10-CM | POA: Diagnosis not present

## 2023-06-04 DIAGNOSIS — M6281 Muscle weakness (generalized): Secondary | ICD-10-CM | POA: Diagnosis not present

## 2023-06-04 DIAGNOSIS — M25541 Pain in joints of right hand: Secondary | ICD-10-CM | POA: Diagnosis not present

## 2023-06-04 DIAGNOSIS — M25641 Stiffness of right hand, not elsewhere classified: Secondary | ICD-10-CM | POA: Diagnosis not present

## 2023-06-09 DIAGNOSIS — M25641 Stiffness of right hand, not elsewhere classified: Secondary | ICD-10-CM | POA: Diagnosis not present

## 2023-06-09 DIAGNOSIS — M6281 Muscle weakness (generalized): Secondary | ICD-10-CM | POA: Diagnosis not present

## 2023-06-09 DIAGNOSIS — M25541 Pain in joints of right hand: Secondary | ICD-10-CM | POA: Diagnosis not present

## 2023-06-11 DIAGNOSIS — M25541 Pain in joints of right hand: Secondary | ICD-10-CM | POA: Diagnosis not present

## 2023-06-11 DIAGNOSIS — M25641 Stiffness of right hand, not elsewhere classified: Secondary | ICD-10-CM | POA: Diagnosis not present

## 2023-06-11 DIAGNOSIS — M6281 Muscle weakness (generalized): Secondary | ICD-10-CM | POA: Diagnosis not present

## 2023-06-16 ENCOUNTER — Ambulatory Visit: Payer: Medicare PPO | Admitting: Neurology

## 2023-06-16 DIAGNOSIS — M25641 Stiffness of right hand, not elsewhere classified: Secondary | ICD-10-CM | POA: Diagnosis not present

## 2023-06-16 DIAGNOSIS — M6281 Muscle weakness (generalized): Secondary | ICD-10-CM | POA: Diagnosis not present

## 2023-06-16 DIAGNOSIS — M25541 Pain in joints of right hand: Secondary | ICD-10-CM | POA: Diagnosis not present

## 2023-06-18 DIAGNOSIS — M25541 Pain in joints of right hand: Secondary | ICD-10-CM | POA: Diagnosis not present

## 2023-06-18 DIAGNOSIS — M6281 Muscle weakness (generalized): Secondary | ICD-10-CM | POA: Diagnosis not present

## 2023-06-18 DIAGNOSIS — M25641 Stiffness of right hand, not elsewhere classified: Secondary | ICD-10-CM | POA: Diagnosis not present

## 2023-06-23 DIAGNOSIS — M6281 Muscle weakness (generalized): Secondary | ICD-10-CM | POA: Diagnosis not present

## 2023-06-23 DIAGNOSIS — M25541 Pain in joints of right hand: Secondary | ICD-10-CM | POA: Diagnosis not present

## 2023-06-23 DIAGNOSIS — M25641 Stiffness of right hand, not elsewhere classified: Secondary | ICD-10-CM | POA: Diagnosis not present

## 2023-06-25 DIAGNOSIS — M25641 Stiffness of right hand, not elsewhere classified: Secondary | ICD-10-CM | POA: Diagnosis not present

## 2023-06-25 DIAGNOSIS — M6281 Muscle weakness (generalized): Secondary | ICD-10-CM | POA: Diagnosis not present

## 2023-06-25 DIAGNOSIS — M25541 Pain in joints of right hand: Secondary | ICD-10-CM | POA: Diagnosis not present

## 2023-06-27 ENCOUNTER — Other Ambulatory Visit: Payer: Self-pay | Admitting: Family Medicine

## 2023-06-30 DIAGNOSIS — M25541 Pain in joints of right hand: Secondary | ICD-10-CM | POA: Diagnosis not present

## 2023-06-30 DIAGNOSIS — M6281 Muscle weakness (generalized): Secondary | ICD-10-CM | POA: Diagnosis not present

## 2023-06-30 DIAGNOSIS — M25641 Stiffness of right hand, not elsewhere classified: Secondary | ICD-10-CM | POA: Diagnosis not present

## 2023-07-02 DIAGNOSIS — M25641 Stiffness of right hand, not elsewhere classified: Secondary | ICD-10-CM | POA: Diagnosis not present

## 2023-07-02 DIAGNOSIS — M6281 Muscle weakness (generalized): Secondary | ICD-10-CM | POA: Diagnosis not present

## 2023-07-02 DIAGNOSIS — M25541 Pain in joints of right hand: Secondary | ICD-10-CM | POA: Diagnosis not present

## 2023-07-09 DIAGNOSIS — M25641 Stiffness of right hand, not elsewhere classified: Secondary | ICD-10-CM | POA: Diagnosis not present

## 2023-07-09 DIAGNOSIS — M25541 Pain in joints of right hand: Secondary | ICD-10-CM | POA: Diagnosis not present

## 2023-07-09 DIAGNOSIS — M6281 Muscle weakness (generalized): Secondary | ICD-10-CM | POA: Diagnosis not present

## 2023-07-10 DIAGNOSIS — M25641 Stiffness of right hand, not elsewhere classified: Secondary | ICD-10-CM | POA: Diagnosis not present

## 2023-07-10 DIAGNOSIS — M6281 Muscle weakness (generalized): Secondary | ICD-10-CM | POA: Diagnosis not present

## 2023-07-10 DIAGNOSIS — M25541 Pain in joints of right hand: Secondary | ICD-10-CM | POA: Diagnosis not present

## 2023-07-16 DIAGNOSIS — M25541 Pain in joints of right hand: Secondary | ICD-10-CM | POA: Diagnosis not present

## 2023-07-16 DIAGNOSIS — M25641 Stiffness of right hand, not elsewhere classified: Secondary | ICD-10-CM | POA: Diagnosis not present

## 2023-07-16 DIAGNOSIS — M6281 Muscle weakness (generalized): Secondary | ICD-10-CM | POA: Diagnosis not present

## 2023-07-18 DIAGNOSIS — M6281 Muscle weakness (generalized): Secondary | ICD-10-CM | POA: Diagnosis not present

## 2023-07-18 DIAGNOSIS — M25541 Pain in joints of right hand: Secondary | ICD-10-CM | POA: Diagnosis not present

## 2023-07-18 DIAGNOSIS — M25641 Stiffness of right hand, not elsewhere classified: Secondary | ICD-10-CM | POA: Diagnosis not present

## 2023-07-24 ENCOUNTER — Ambulatory Visit: Payer: Medicare PPO

## 2023-07-24 VITALS — BP 136/64 | HR 82 | Ht 67.0 in | Wt 180.0 lb

## 2023-07-24 DIAGNOSIS — Z0000 Encounter for general adult medical examination without abnormal findings: Secondary | ICD-10-CM | POA: Diagnosis not present

## 2023-07-24 NOTE — Patient Instructions (Signed)
 Terry Winters , Thank you for taking time out of your busy schedule to complete your Annual Wellness Visit with me. I enjoyed our conversation and look forward to speaking with you again next year. I, as well as your care team,  appreciate your ongoing commitment to your health goals. Please review the following plan we discussed and let me know if I can assist you in the future. Your Game plan/ To Do List    Follow up Visits: Next Medicare AWV with our clinical staff: 07/26/24 at 8:00a.m.   Next Office Visit with your provider: 10/02/23 at 11:10a.m.  Clinician Recommendations:  Aim for 30 minutes of exercise or brisk walking, 6-8 glasses of water, and 5 servings of fruits and vegetables each day. N/a      This is a list of the screening recommended for you and due dates:  Health Maintenance  Topic Date Due   COVID-19 Vaccine (4 - 2024-25 season) 08/08/2024*   Flu Shot  09/12/2023   Medicare Annual Wellness Visit  07/23/2024   DTaP/Tdap/Td vaccine (2 - Td or Tdap) 12/20/2029   Pneumococcal Vaccine for age over 47  Completed   Zoster (Shingles) Vaccine  Completed   HPV Vaccine  Aged Out   Meningitis B Vaccine  Aged Out  *Topic was postponed. The date shown is not the original due date.    Advanced directives: (Declined) Advance directive discussed with you today. Even though you declined this today, please call our office should you change your mind, and we can give you the proper paperwork for you to fill out. Advance Care Planning is important because it:  [x]  Makes sure you receive the medical care that is consistent with your values, goals, and preferences  [x]  It provides guidance to your family and loved ones and reduces their decisional burden about whether or not they are making the right decisions based on your wishes.  Follow the link provided in your after visit summary or read over the paperwork we have mailed to you to help you started getting your Advance Directives in place. If  you need assistance in completing these, please reach out to us  so that we can help you!  See attachments for Preventive Care and Fall Prevention Tips.

## 2023-07-24 NOTE — Progress Notes (Signed)
 Subjective:   Terry Winters is a 89 y.o. who presents for a Medicare Wellness preventive visit.  As a reminder, Annual Wellness Visits don't include a physical exam, and some assessments may be limited, especially if this visit is performed virtually. We may recommend an in-person follow-up visit with your provider if needed.  Visit Complete: Virtual I connected with  Kathrin Pares on 07/24/23 by a audio enabled telemedicine application and verified that I am speaking with the correct person using two identifiers.  Patient Location: Home  Provider Location: Home Office  I discussed the limitations of evaluation and management by telemedicine. The patient expressed understanding and agreed to proceed.  Vital Signs: Because this visit was a virtual/telehealth visit, some criteria may be missing or patient reported. Any vitals not documented were not able to be obtained and vitals that have been documented are patient reported.  VideoDeclined- This patient declined Librarian, academic. Therefore the visit was completed with audio only.  Persons Participating in Visit: Patient.  AWV Questionnaire: No: Patient Medicare AWV questionnaire was not completed prior to this visit.  Cardiac Risk Factors include: advanced age (>99men, >57 women);dyslipidemia;hypertension;male gender     Objective:    Today's Vitals   07/24/23 0802  BP: 136/64  Pulse: 82  Weight: 180 lb (81.6 kg)  Height: 5' 7 (1.702 m)   Body mass index is 28.19 kg/m.     07/24/2023    8:23 AM 07/23/2022    8:26 AM 07/20/2021    8:23 AM 07/19/2020   10:15 AM 10/16/2017    2:26 PM 11/14/2016   11:15 AM 11/10/2014    1:05 PM  Advanced Directives  Does Patient Have a Medical Advance Directive? No No No No No  No  No   Would patient like information on creating a medical advance directive?  No - Patient declined No - Patient declined No - Patient declined No - Patient declined  No - Patient declined   No - patient declined information      Data saved with a previous flowsheet row definition    Current Medications (verified) Outpatient Encounter Medications as of 07/24/2023  Medication Sig   amLODipine  (NORVASC ) 2.5 MG tablet TAKE 1 TABLET BY MOUTH EVERY DAY   atorvastatin  (LIPITOR) 20 MG tablet TAKE 1 TABLET EVERY DAY AT 6 PM   finasteride  (PROSCAR ) 5 MG tablet TAKE 1 TABLET BY MOUTH EVERY DAY IN THE MORNING   fluticasone  (FLONASE ) 50 MCG/ACT nasal spray Place 2 sprays into both nostrils as needed for allergies or rhinitis.   Multiple Vitamins-Minerals (ZINC PO) Take 1 tablet by mouth once a week.    naproxen  (NAPROSYN ) 375 MG tablet TAKE 1 TABLET TWICE DAILY WITH MEALS   zolpidem  (AMBIEN ) 5 MG tablet TAKE 1 TABLET BY MOUTH AT BEDTIME AS NEEDED FOR SLEEP.   No facility-administered encounter medications on file as of 07/24/2023.    Allergies (verified) Amoxicillin , Prednisone, Sulfa antibiotics, and Triamcinolone    History: Past Medical History:  Diagnosis Date   BPH (benign prostatic hypertrophy)    Foley catheter in place    GERD (gastroesophageal reflux disease)    watches diet   History of gout    as of 11-12-2016  per pt stable   Hyperlipidemia    Hypertension    per pt stopped his medication losartan  on his own--- last taken 2nd week sept. 2018--- per pt checks bp and has been ok and watches diet   Seasonal allergies  Type 2 diabetes, diet controlled (HCC)    Urinary retention    Vitamin D  deficiency    Wears glasses    Wears partial dentures    upper   Past Surgical History:  Procedure Laterality Date   ANTERIOR CERVICAL DECOMP/DISCECTOMY FUSION  1993   TONSILLECTOMY  child   TRANSURETHRAL RESECTION OF PROSTATE N/A 11/14/2016   Procedure: TRANSURETHRAL RESECTION OF THE PROSTATE (TURP);  Surgeon: Homero Luster, MD;  Location: [facility];  Service: Urology;  Laterality: N/A;   Family History  Problem Relation Age of Onset   ALS Father     Social History   Socioeconomic History   Marital status: Married    Spouse name: Lona Rist   Number of children: 3   Years of education: Not on file   Highest education level: Not on file  Occupational History   Occupation: retired  Tobacco Use   Smoking status: Former    Current packs/day: 0.00    Average packs/day: 0.5 packs/day for 15.0 years (7.5 ttl pk-yrs)    Types: Cigars, Cigarettes    Start date: 06/16/1951    Quit date: 06/16/1966    Years since quitting: 57.1   Smokeless tobacco: Never  Vaping Use   Vaping status: Never Used  Substance and Sexual Activity   Alcohol use: Yes    Comment: occasionally   Drug use: No   Sexual activity: Not on file  Other Topics Concern   Not on file  Social History Narrative   07/20/2021 - Lives home with wife. Enjoys playing golf. Cuts hay, mows, lots of yard work, very active   Teenage grandsons stay with them a lot of weekends   Social Drivers of Corporate investment banker Strain: Low Risk  (07/24/2023)   Overall Financial Resource Strain (CARDIA)    Difficulty of Paying Living Expenses: Not hard at all  Food Insecurity: No Food Insecurity (07/24/2023)   Hunger Vital Sign    Worried About Running Out of Food in the Last Year: Never true    Ran Out of Food in the Last Year: Never true  Transportation Needs: No Transportation Needs (07/24/2023)   PRAPARE - Administrator, Civil Service (Medical): No    Lack of Transportation (Non-Medical): No  Physical Activity: Inactive (07/24/2023)   Exercise Vital Sign    Days of Exercise per Week: 0 days    Minutes of Exercise per Session: Not on file  Stress: No Stress Concern Present (07/24/2023)   [employer] of Occupational Health - Occupational Stress Questionnaire    Feeling of Stress: Not at all  Social Connections: Socially Integrated (07/24/2023)   Social Connection and Isolation Panel    Frequency of Communication with Friends and Family: More than three times a week     Frequency of Social Gatherings with Friends and Family: More than three times a week    Attends Religious Services: More than 4 times per year    Active Member of [employer] or Organizations: Yes    Attends Engineer, structural: More than 4 times per year    Marital Status: Married    Tobacco Counseling Counseling given: Yes    Clinical Intake:  Pre-visit preparation completed: Yes  Pain : No/denies pain     BMI - recorded: 28.19 Nutritional Status: BMI 25 -29 Overweight Nutritional Risks: None Diabetes: No  Lab Results  Component Value Date   HGBA1C 6.0 (H) 04/04/2023   HGBA1C 5.8 (H) 10/03/2022  HGBA1C 6.3 (H) 03/06/2022     How often do you need to have someone help you when you read instructions, pamphlets, or other written materials from your doctor or pharmacy?: 1 - Never  Interpreter Needed?: No  Information entered by :: Alia T/cma   Activities of Daily Living     07/24/2023    8:10 AM  In your present state of health, do you have any difficulty performing the following activities:  Hearing? 0  Vision? 0  Comment pt goes [facility] in [place]/last ov a couple weeks ago  Difficulty concentrating or making decisions? 0  Walking or climbing stairs? 0  Dressing or bathing? 0  Doing errands, shopping? 0  Preparing Food and eating ? N  Using the Toilet? N  In the past six months, have you accidently leaked urine? Y  Do you have problems with loss of bowel control? N  Managing your Medications? N  Managing your Finances? N  Housekeeping or managing your Housekeeping? N    Patient Care Team: Dettinger, Lucio Sabin, MD as PCP - General (Family Medicine) Homero Luster, MD as Attending Physician (Urology) Sidra Dredge, MD as Consulting Physician (Ophthalmology)  I have updated your Care Teams any recent Medical Services you may have received from other providers in the past year.     Assessment:   This is a routine wellness examination for  [place].  Hearing/Vision screen Hearing Screening - Comments:: Pt denies hearing/need cleaning and will get the next ov w/pcp Vision Screening - Comments:: pt goes [facility] in [place]/last ov a couple weeks ago   Goals Addressed             This Visit's Progress    DIET - EAT MORE FRUITS AND VEGETABLES   On track    DIET - INCREASE WATER INTAKE   On track      Depression Screen     07/24/2023    8:27 AM 04/04/2023    1:02 PM 03/03/2023    4:24 PM 10/03/2022    4:20 PM 07/23/2022    8:24 AM 03/06/2022   10:31 AM 08/29/2021   11:00 AM  PHQ 2/9 Scores  PHQ - 2 Score 0 0 0 0 0 0 0  PHQ- 9 Score 0          Fall Risk     07/24/2023    8:22 AM 04/04/2023    1:02 PM 03/03/2023    4:24 PM 10/03/2022    4:20 PM 07/23/2022    8:22 AM  Fall Risk   Falls in the past year? 1 1 1  0 0  Number falls in past yr: 0 0 0  0  Injury with Fall? 1 1 1   0  Risk for fall due to : Impaired balance/gait;Impaired mobility Impaired balance/gait Impaired balance/gait  No Fall Risks  Follow up  Falls evaluation completed Falls evaluation completed  Falls prevention discussed    MEDICARE RISK AT HOME:  Medicare Risk at Home Any stairs in or around the home?: Yes If so, are there any without handrails?: Yes Home free of loose throw rugs in walkways, pet beds, electrical cords, etc?: Yes Adequate lighting in your home to reduce risk of falls?: Yes Life alert?: No Use of a cane, walker or w/c?: Yes (cane) Grab bars in the bathroom?: No Shower chair or bench in shower?: Yes Elevated toilet seat or a handicapped toilet?: Yes  TIMED UP AND GO:  Was the test performed?  no  Cognitive Function: 6CIT completed    10/16/2017    2:29 PM 11/10/2014    1:09 PM  MMSE - Mini Mental State Exam  Orientation to time 5 5   Orientation to Place 5 5   Registration 3 3   Attention/ Calculation 5 5   Recall 3 3   Language- name 2 objects 2 2   Language- repeat 1 1  Language- follow 3 step command 3 3    Language- read & follow direction 1 1   Write a sentence 1 1   Copy design 1 1   Total score 30 30      Data saved with a previous flowsheet row definition        07/24/2023    8:28 AM 07/23/2022    8:26 AM 07/20/2021    8:26 AM 07/19/2020   10:07 AM  6CIT Screen  What Year? 0 points 0 points 0 points 0 points  What month? 0 points 0 points 0 points 0 points  What time? 0 points 0 points 0 points 0 points  Count back from 20 0 points 0 points 0 points 0 points  Months in reverse 0 points 0 points 0 points 0 points  Repeat phrase 0 points 0 points 0 points 0 points  Total Score 0 points 0 points 0 points 0 points    Immunizations Immunization History  Administered Date(s) Administered   Fluad Quad(high Dose 65+) 11/13/2020, 11/28/2021, 12/02/2022   Influenza, High Dose Seasonal PF 12/17/2016, 12/01/2017, 12/09/2018, 12/08/2019   Influenza,inj,Quad PF,6+ Mos 12/30/2013, 11/10/2014, 01/08/2016   Moderna Sars-Covid-2 Vaccination 03/10/2019, 04/07/2019, 10/28/2019   Pneumococcal Conjugate-13 01/08/2016   Pneumococcal Polysaccharide-23 07/16/2013   Tdap 12/21/2019   Zoster Recombinant(Shingrix ) 08/29/2021, 03/06/2022    Screening Tests Health Maintenance  Topic Date Due   COVID-19 Vaccine (4 - 2024-25 season) 08/08/2024 (Originally 10/13/2022)   INFLUENZA VACCINE  09/12/2023   Medicare Annual Wellness (AWV)  07/23/2024   DTaP/Tdap/Td (2 - Td or Tdap) 12/20/2029   Pneumococcal Vaccine: 50+ Years  Completed   Zoster Vaccines- Shingrix   Completed   HPV VACCINES  Aged Out   Meningococcal B Vaccine  Aged Out    Health Maintenance  There are no preventive care reminders to display for this patient.  Health Maintenance Items Addressed: See Nurse Notes at the end of this note  Additional Screening:  Vision Screening: Recommended annual ophthalmology exams for early detection of glaucoma and other disorders of the eye. Would you like a referral to an eye doctor? No    Dental  Screening: Recommended annual dental exams for proper oral hygiene  Community Resource Referral / Chronic Care Management: CRR required this visit?  No   CCM required this visit?  No   Plan:    I have personally reviewed and noted the following in the patient's chart:   Medical and social history Use of alcohol, tobacco or illicit drugs  Current medications and supplements including opioid prescriptions. Patient is not currently taking opioid prescriptions. Functional ability and status Nutritional status Physical activity Advanced directives List of other physicians Hospitalizations, surgeries, and ER visits in previous 12 months Vitals Screenings to include cognitive, depression, and falls Referrals and appointments  In addition, I have reviewed and discussed with patient certain preventive protocols, quality metrics, and best practice recommendations. A written personalized care plan for preventive services as well as general preventive health recommendations were provided to patient.   Michaelle Adolphus, [facility]   07/24/2023  After Visit Summary: (Declined) Due to this being a telephonic visit, with patients personalized plan was offered to patient but patient Declined AVS at this time   Notes: Nothing significant to report at this time.

## 2023-07-25 DIAGNOSIS — M25641 Stiffness of right hand, not elsewhere classified: Secondary | ICD-10-CM | POA: Diagnosis not present

## 2023-07-25 DIAGNOSIS — M6281 Muscle weakness (generalized): Secondary | ICD-10-CM | POA: Diagnosis not present

## 2023-07-25 DIAGNOSIS — M25541 Pain in joints of right hand: Secondary | ICD-10-CM | POA: Diagnosis not present

## 2023-08-01 DIAGNOSIS — M6281 Muscle weakness (generalized): Secondary | ICD-10-CM | POA: Diagnosis not present

## 2023-08-01 DIAGNOSIS — M25641 Stiffness of right hand, not elsewhere classified: Secondary | ICD-10-CM | POA: Diagnosis not present

## 2023-08-01 DIAGNOSIS — M25541 Pain in joints of right hand: Secondary | ICD-10-CM | POA: Diagnosis not present

## 2023-08-08 DIAGNOSIS — M6281 Muscle weakness (generalized): Secondary | ICD-10-CM | POA: Diagnosis not present

## 2023-08-08 DIAGNOSIS — M25641 Stiffness of right hand, not elsewhere classified: Secondary | ICD-10-CM | POA: Diagnosis not present

## 2023-08-08 DIAGNOSIS — M25541 Pain in joints of right hand: Secondary | ICD-10-CM | POA: Diagnosis not present

## 2023-08-29 DIAGNOSIS — M25641 Stiffness of right hand, not elsewhere classified: Secondary | ICD-10-CM | POA: Diagnosis not present

## 2023-08-29 DIAGNOSIS — M25541 Pain in joints of right hand: Secondary | ICD-10-CM | POA: Diagnosis not present

## 2023-08-29 DIAGNOSIS — M6281 Muscle weakness (generalized): Secondary | ICD-10-CM | POA: Diagnosis not present

## 2023-09-02 DIAGNOSIS — M25641 Stiffness of right hand, not elsewhere classified: Secondary | ICD-10-CM | POA: Diagnosis not present

## 2023-09-02 DIAGNOSIS — M25541 Pain in joints of right hand: Secondary | ICD-10-CM | POA: Diagnosis not present

## 2023-09-02 DIAGNOSIS — M6281 Muscle weakness (generalized): Secondary | ICD-10-CM | POA: Diagnosis not present

## 2023-09-12 DIAGNOSIS — M6281 Muscle weakness (generalized): Secondary | ICD-10-CM | POA: Diagnosis not present

## 2023-09-12 DIAGNOSIS — M25541 Pain in joints of right hand: Secondary | ICD-10-CM | POA: Diagnosis not present

## 2023-09-12 DIAGNOSIS — M25641 Stiffness of right hand, not elsewhere classified: Secondary | ICD-10-CM | POA: Diagnosis not present

## 2023-09-17 DIAGNOSIS — M25641 Stiffness of right hand, not elsewhere classified: Secondary | ICD-10-CM | POA: Diagnosis not present

## 2023-09-17 DIAGNOSIS — M25541 Pain in joints of right hand: Secondary | ICD-10-CM | POA: Diagnosis not present

## 2023-09-17 DIAGNOSIS — M6281 Muscle weakness (generalized): Secondary | ICD-10-CM | POA: Diagnosis not present

## 2023-09-26 DIAGNOSIS — M25541 Pain in joints of right hand: Secondary | ICD-10-CM | POA: Diagnosis not present

## 2023-09-26 DIAGNOSIS — M25641 Stiffness of right hand, not elsewhere classified: Secondary | ICD-10-CM | POA: Diagnosis not present

## 2023-09-26 DIAGNOSIS — M6281 Muscle weakness (generalized): Secondary | ICD-10-CM | POA: Diagnosis not present

## 2023-09-27 ENCOUNTER — Other Ambulatory Visit: Payer: Self-pay | Admitting: Family Medicine

## 2023-10-02 ENCOUNTER — Ambulatory Visit (INDEPENDENT_AMBULATORY_CARE_PROVIDER_SITE_OTHER): Payer: Medicare PPO | Admitting: Family Medicine

## 2023-10-02 ENCOUNTER — Encounter: Payer: Self-pay | Admitting: Family Medicine

## 2023-10-02 VITALS — BP 164/79 | HR 82 | Ht 67.0 in | Wt 179.0 lb

## 2023-10-02 DIAGNOSIS — E782 Mixed hyperlipidemia: Secondary | ICD-10-CM

## 2023-10-02 DIAGNOSIS — R7303 Prediabetes: Secondary | ICD-10-CM

## 2023-10-02 DIAGNOSIS — I10 Essential (primary) hypertension: Secondary | ICD-10-CM | POA: Diagnosis not present

## 2023-10-02 DIAGNOSIS — Z79899 Other long term (current) drug therapy: Secondary | ICD-10-CM | POA: Diagnosis not present

## 2023-10-02 DIAGNOSIS — Z125 Encounter for screening for malignant neoplasm of prostate: Secondary | ICD-10-CM | POA: Diagnosis not present

## 2023-10-02 LAB — BAYER DCA HB A1C WAIVED: HB A1C (BAYER DCA - WAIVED): 5.7 % — ABNORMAL HIGH (ref 4.8–5.6)

## 2023-10-02 LAB — LIPID PANEL

## 2023-10-02 NOTE — Progress Notes (Signed)
 BP (!) 164/79   Pulse 82   Ht 5' 7 (1.702 m)   Wt 179 lb (81.2 kg)   SpO2 96%   BMI 28.04 kg/m    Subjective:   Patient ID: Terry Winters, male    DOB: 1934/07/23, 89 y.o.   MRN: [id number]  HPI: Terry Winters is a 89 y.o. male presenting on 10/02/2023 for Medical Management of Chronic Issues, Hypertension, and Prediabetes   Discussed the use of AI scribe software for clinical note transcription with the patient, who gave verbal consent to proceed.  History of Present Illness   Terry Winters is an 89 year old male with hypertension and prediabetes who presents for follow-up on blood pressure management and medication adherence.  He acknowledges recent elevated blood pressure and admits to not taking his medication this morning. He has a blood pressure meter at home. He is prescribed amlodipine , which he did not take today.  He confirms taking atorvastatin  daily for cholesterol management and mentions some issues with pharmacy services but continues to receive his medication.  He uses Ambien  for sleep, taking half a tablet as needed. He previously used Benadryl  for sleep but found it less effective.  Regarding prediabetes management, he uses Splenda and Stevia as sugar substitutes and tries to limit sugar intake. He uses honey in his coffee.  His wrist is improving but remains stiff, and he continues exercises to improve it.  No significant swelling in his legs, although he notes some knee discomfort.          Relevant past medical, surgical, family and social history reviewed and updated as indicated. Interim medical history since our last visit reviewed. Allergies and medications reviewed and updated.  Review of Systems  Constitutional:  Negative for chills and fever.  Eyes:  Negative for visual disturbance.  Respiratory:  Negative for shortness of breath and wheezing.   Cardiovascular:  Negative for chest pain and leg swelling.  Musculoskeletal:  Negative for  back pain and gait problem.  Skin:  Negative for rash.  Neurological:  Negative for dizziness and light-headedness.  All other systems reviewed and are negative.   Per HPI unless specifically indicated above   Allergies as of 10/02/2023       Reactions   Amoxicillin  Rash   Prednisone Rash   Sulfa Antibiotics Other (See Comments)   upset stomach   Triamcinolone  Rash        Medication List        Accurate as of October 02, 2023 12:57 PM. If you have any questions, ask your nurse or doctor.          amLODipine  2.5 MG tablet Commonly known as: NORVASC  TAKE 1 TABLET BY MOUTH EVERY DAY   atorvastatin  20 MG tablet Commonly known as: LIPITOR TAKE 1 TABLET EVERY DAY AT 6 PM   finasteride  5 MG tablet Commonly known as: PROSCAR  TAKE 1 TABLET BY MOUTH EVERY DAY IN THE MORNING   fluticasone  50 MCG/ACT nasal spray Commonly known as: FLONASE  Place 2 sprays into both nostrils as needed for allergies or rhinitis.   naproxen  375 MG tablet Commonly known as: NAPROSYN  TAKE 1 TABLET TWICE DAILY WITH MEALS   ZINC PO Take 1 tablet by mouth once a week.   zolpidem  5 MG tablet Commonly known as: AMBIEN  TAKE 1 TABLET BY MOUTH AT BEDTIME AS NEEDED FOR SLEEP.         Objective:   BP (!) 164/79   Pulse 82  Ht 5' 7 (1.702 m)   Wt 179 lb (81.2 kg)   SpO2 96%   BMI 28.04 kg/m   Wt Readings from Last 3 Encounters:  10/02/23 179 lb (81.2 kg)  07/24/23 180 lb (81.6 kg)  04/04/23 180 lb (81.6 kg)    Physical Exam Physical Exam   CHEST: Lungs clear to auscultation bilaterally. CARDIOVASCULAR: Heart regular rate and rhythm, no murmurs. EXTREMITIES: Legs with slight swelling.         Assessment & Plan:   Problem List Items Addressed This Visit       Cardiovascular and Mediastinum   HTN (hypertension)   Relevant Orders   CBC with Differential/Platelet   CMP14+EGFR   Lipid panel     Other   HLD (hyperlipidemia) - Primary   Relevant Orders   CBC with  Differential/Platelet   CMP14+EGFR   Lipid panel   Prediabetes   Relevant Orders   Bayer DCA Hb A1c Waived   Other Visit Diagnoses       Controlled substance agreement signed       Relevant Orders   ToxASSURE Select 13 (MW), Urine     Prostate cancer screening       Relevant Orders   PSA, total and free         Hypertension Blood pressure was elevated during the visit. He did not take his amlodipine  this morning and does not regularly check his blood pressure at home. - Instruct him to check blood pressure daily for two weeks and report readings. - Advise him to take amlodipine  as prescribed.  Hyperlipidemia He is taking atorvastatin  (Lipitor) daily and reports adherence to the medication regimen.  Prediabetes He is managing prediabetes with dietary modifications, including using Splenda and Stevia instead of sugar. He is aware of the need to reduce sugar intake. - Encourage continued use of sugar substitutes and reduction of sugar intake.  Insomnia He is using Ambien  for insomnia, taking half a tablet as needed. He reports that it is effective and does not take it every night. - Continue Ambien  as needed for insomnia.          Follow up plan: Return in about 6 months (around 04/03/2024), or if symptoms worsen or fail to improve, for Diabetes and hypertension recheck.  Counseling provided for all of the vaccine components Orders Placed This Encounter  Procedures   Bayer DCA Hb A1c Waived   CBC with Differential/Platelet   CMP14+EGFR   Lipid panel   ToxASSURE Select 13 (MW), Urine   PSA, total and free    Fonda Levins, MD [facility] 10/02/2023, 12:57 PM

## 2023-10-03 LAB — CMP14+EGFR
ALT: 21 IU/L (ref 0–44)
AST: 23 IU/L (ref 0–40)
Albumin: 4.2 g/dL (ref 3.7–4.7)
Alkaline Phosphatase: 109 IU/L (ref 44–121)
BUN/Creatinine Ratio: 15 (ref 10–24)
BUN: 14 mg/dL (ref 8–27)
Bilirubin Total: 0.6 mg/dL (ref 0.0–1.2)
CO2: 23 mmol/L (ref 20–29)
Calcium: 9.6 mg/dL (ref 8.6–10.2)
Chloride: 98 mmol/L (ref 96–106)
Creatinine, Ser: 0.94 mg/dL (ref 0.76–1.27)
Globulin, Total: 2.4 g/dL (ref 1.5–4.5)
Glucose: 133 mg/dL — AB (ref 70–99)
Potassium: 4.4 mmol/L (ref 3.5–5.2)
Sodium: 137 mmol/L (ref 134–144)
Total Protein: 6.6 g/dL (ref 6.0–8.5)
eGFR: 79 mL/min/1.73 (ref >59)

## 2023-10-03 LAB — CBC WITH DIFFERENTIAL/PLATELET
Basophils Absolute: 0.1 x10E3/uL (ref 0.0–0.2)
Basos: 1 %
EOS (ABSOLUTE): 0.3 x10E3/uL (ref 0.0–0.4)
Eos: 3 %
Hematocrit: 49.3 % (ref 37.5–51.0)
Hemoglobin: 15.9 g/dL (ref 13.0–17.7)
Immature Grans (Abs): 0.1 x10E3/uL (ref 0.0–0.1)
Immature Granulocytes: 1 %
Lymphocytes Absolute: 2.4 x10E3/uL (ref 0.7–3.1)
Lymphs: 23 %
MCH: 28.9 pg (ref 26.6–33.0)
MCHC: 32.3 g/dL (ref 31.5–35.7)
MCV: 90 fL (ref 79–97)
Monocytes Absolute: 0.9 x10E3/uL (ref 0.1–0.9)
Monocytes: 9 %
Neutrophils Absolute: 6.8 x10E3/uL (ref 1.4–7.0)
Neutrophils: 63 %
Platelets: 262 x10E3/uL (ref 150–450)
RBC: 5.5 x10E6/uL (ref 4.14–5.80)
RDW: 13.1 % (ref 11.6–15.4)
WBC: 10.5 x10E3/uL (ref 3.4–10.8)

## 2023-10-03 LAB — LIPID PANEL
Cholesterol, Total: 146 mg/dL (ref 100–199)
HDL: 53 mg/dL (ref >39)
LDL CALC COMMENT:: 2.8 ratio (ref 0.0–5.0)
LDL Chol Calc (NIH): 75 mg/dL (ref 0–99)
Triglycerides: 98 mg/dL (ref 0–149)
VLDL Cholesterol Cal: 18 mg/dL (ref 5–40)

## 2023-10-03 LAB — PSA, TOTAL AND FREE
PSA, Free Pct: 32.5
PSA, Free: 0.13 ng/mL
Prostate Specific Ag, Serum: 0.4 ng/mL (ref 0.0–4.0)

## 2023-10-04 LAB — TOXASSURE SELECT 13 (MW), URINE

## 2023-10-09 ENCOUNTER — Ambulatory Visit: Payer: Self-pay | Admitting: Family Medicine

## 2023-10-10 DIAGNOSIS — M25541 Pain in joints of right hand: Secondary | ICD-10-CM | POA: Diagnosis not present

## 2023-10-10 DIAGNOSIS — M6281 Muscle weakness (generalized): Secondary | ICD-10-CM | POA: Diagnosis not present

## 2023-10-10 DIAGNOSIS — M25641 Stiffness of right hand, not elsewhere classified: Secondary | ICD-10-CM | POA: Diagnosis not present

## 2023-10-17 DIAGNOSIS — M25641 Stiffness of right hand, not elsewhere classified: Secondary | ICD-10-CM | POA: Diagnosis not present

## 2023-10-17 DIAGNOSIS — M6281 Muscle weakness (generalized): Secondary | ICD-10-CM | POA: Diagnosis not present

## 2023-10-17 DIAGNOSIS — M25541 Pain in joints of right hand: Secondary | ICD-10-CM | POA: Diagnosis not present

## 2023-10-22 ENCOUNTER — Ambulatory Visit: Payer: Self-pay

## 2023-10-22 NOTE — Telephone Encounter (Signed)
 I called and spoke with patient and made him aware that he is going to be okay and to make sure he doesn't take anymore of that medication today and starting back tomorrow, he should only take 1 tablet daily. Patient voiced understanding.

## 2023-10-22 NOTE — Telephone Encounter (Signed)
 FYI Only or Action Required?: Action required by provider: clinical question for provider.  Patient was last seen in primary care on 10/02/2023 by Dettinger, Fonda LABOR, MD.  Called Nurse Triage reporting Medication Question.  Triage Disposition: Call PCP Now  Patient/caregiver understands and will follow disposition?: Yes           Copied from CRM [id number]. Topic: Clinical - Red Word Triage >> Oct 22, 2023 10:17 AM Thersia BROCKS wrote: [facility] that prompted transfer to Nurse Triage: Patient accidentally  took 2 finasteride  (PROSCAR ) 5 MG tablet wanted to make sure he is okay or what he needs to do regarding this        Reason for Disposition  [1] DOUBLE DOSE (an extra dose or lesser amount) of prescription drug AND [2] NO symptoms  (Exception: A double dose of antibiotics.)  Answer Assessment - Initial Assessment Questions Action required by provider: Patient would like to know if he should worry about taking an extra dose of his Proscar . Please advise.      1. NAME of MEDICINE: What medicine(s) are you calling about?     finasteride  (PROSCAR ) 5 MG tablet 2. QUESTION: What is your question? (e.g., double dose of medicine, side effect)     Believes he took an extra dose  3. PRESCRIBER: Who prescribed the medicine? Reason: if prescribed by specialist, call should be referred to that group.     Dr. Maryanne  4. SYMPTOMS: Do you have any symptoms? If Yes, ask: What symptoms are you having?  How bad are the symptoms (e.g., mild, moderate, severe)     None  Protocols used: Medication Question Call-A-AH

## 2023-10-24 DIAGNOSIS — M25541 Pain in joints of right hand: Secondary | ICD-10-CM | POA: Diagnosis not present

## 2023-10-24 DIAGNOSIS — M6281 Muscle weakness (generalized): Secondary | ICD-10-CM | POA: Diagnosis not present

## 2023-10-24 DIAGNOSIS — M25641 Stiffness of right hand, not elsewhere classified: Secondary | ICD-10-CM | POA: Diagnosis not present

## 2023-11-03 ENCOUNTER — Other Ambulatory Visit: Payer: Self-pay | Admitting: Family Medicine

## 2023-11-03 ENCOUNTER — Encounter: Payer: Self-pay | Admitting: Neurology

## 2023-11-03 ENCOUNTER — Ambulatory Visit: Admitting: Neurology

## 2023-11-03 VITALS — BP 136/78 | Ht 67.0 in | Wt 182.0 lb

## 2023-11-03 DIAGNOSIS — R29898 Other symptoms and signs involving the musculoskeletal system: Secondary | ICD-10-CM

## 2023-11-03 DIAGNOSIS — R413 Other amnesia: Secondary | ICD-10-CM | POA: Insufficient documentation

## 2023-11-03 DIAGNOSIS — G8929 Other chronic pain: Secondary | ICD-10-CM

## 2023-11-03 MED ORDER — ALPRAZOLAM 0.5 MG PO TABS: ORAL_TABLET | ORAL | 0 refills | Status: DC

## 2023-11-03 NOTE — Progress Notes (Signed)
 Chief Complaint  Patient presents with   New Patient (Initial Visit)    Rm 14, with wife Nichole, memory concerns   ASSESSMENT AND PLAN  Terry Winters is a 89 y.o. male   Slow Worsening memory loss  MRI of brain  Laboratory evaluation to rule out treatable etiology Right wrist drop  Started since January 2025, persistent, also has mild to moderate right hand intrinsic muscle atrophy, suggestive of distal right radial, ulnar nerve involvement,  EMG nerve conduction study to better localize the lesion  DIAGNOSTIC DATA (LABS, IMAGING, TESTING) - I reviewed patient records, labs, notes, testing and imaging myself where available.   MEDICAL HISTORY:  Terry Winters is a 89 year old male, accompanied by his wife, seen in request by his primary care from [facility] Dr. Maryanne, Fonda for evaluation of memory loss, initial evaluation was on November 03, 2023  History is obtained from the patient and review of electronic medical records. I personally reviewed pertinent available imaging films in PACS.   PMHx of HTN HLD Prostate Hypertrophy  DM Cervical decompression  He was a Agricultural engineer, retired since 2005, enjoying yard work, remain active at home, eating well, sleeping well, still drives short distance without loss, able to keep his broken balance,  Over the last couple years, he noted to have mild memory loss, keep the lid of the jar open, forgot to close the cabinet, MoCA examination 23/30, missed 5 out of 5 recall, her mother suffered dementia in her old age  He is enjoying playing golf, fell in January 2025, since then, complains of knee pain, mild gait abnormality,  He was not able to give a clear timeline, reported even before his fall in January, he has developed right hand weakness, difficulty extent his right wrist and fingers, he contributed to his joints pain, but there was no significant recovery over the past 1 year despite better control of his joint  pain    PHYSICAL EXAM:   Vitals:   11/03/23 1415 11/03/23 1417  BP: (!) 148/84 136/78  Weight: 182 lb (82.6 kg)   Height: 5' 7 (1.702 m)      Body mass index is 28.51 kg/m.  PHYSICAL EXAMNIATION:  Gen: NAD, conversant, well nourised, well groomed                     Cardiovascular: Regular rate rhythm, no peripheral edema, warm, nontender. Eyes: Conjunctivae clear without exudates or hemorrhage Neck: Supple, no carotid bruits. Pulmonary: Clear to auscultation bilaterally   NEUROLOGICAL EXAM:  MENTAL STATUS: Speech/cognition: Awake, alert, oriented to history taking and casual conversation    11/03/2023    2:20 PM  Montreal Cognitive Assessment   Visuospatial/ Executive (0/5) 3  Naming (0/3) 3  Attention: Read list of digits (0/2) 2  Attention: Read list of letters (0/1) 1  Attention: Serial 7 subtraction starting at 100 (0/3) 3  Language: Repeat phrase (0/2) 2  Language : Fluency (0/1) 1  Abstraction (0/2) 2  Delayed Recall (0/5) 0  Orientation (0/6) 6  Total 23    CRANIAL NERVES: CN II: Visual fields are full to confrontation. Pupils are round equal and briskly reactive to light. CN III, IV, VI: extraocular movement are normal. No ptosis. CN V: Facial sensation is intact to light touch CN VII: Face is symmetric with normal eye closure  CN VIII: Mild hearing loss CN IX, X: Phonation is normal. CN XI: Head turning and shoulder shrug are intact  MOTOR:  mild right hand intrinsic hand atrophy, mild to moderate finger abduction, mild right wrist, moderate right finger extension weakness  REFLEXES: Reflexes are 1 and symmetric at the biceps, triceps, knees, and absent ankles. Plantar responses are flexor.  SENSORY: Length-dependent decreased light touch pinprick vibratory sensation to distal shin level  COORDINATION: There is no trunk or limb dysmetria noted.  GAIT/STANCE: Push-up, cautious mildly antalgic  REVIEW OF SYSTEMS:  Full 14 system review of  systems performed and notable only for as above All other review of systems were negative.   ALLERGIES: Allergies  Allergen Reactions   Amoxicillin  Rash   Prednisone Rash   Sulfa Antibiotics Other (See Comments)    upset stomach   Triamcinolone  Rash    HOME MEDICATIONS: Current Outpatient Medications  Medication Sig Dispense Refill   amLODipine  (NORVASC ) 2.5 MG tablet TAKE 1 TABLET BY MOUTH EVERY DAY 90 tablet 1   atorvastatin  (LIPITOR) 20 MG tablet TAKE 1 TABLET EVERY DAY AT 6 PM 90 tablet 3   finasteride  (PROSCAR ) 5 MG tablet TAKE 1 TABLET BY MOUTH EVERY DAY IN THE MORNING 90 tablet 0   fluticasone  (FLONASE ) 50 MCG/ACT nasal spray Place 2 sprays into both nostrils as needed for allergies or rhinitis. 48 mL 1   Multiple Vitamins-Minerals (ZINC PO) Take 1 tablet by mouth once a week.      naproxen  (NAPROSYN ) 375 MG tablet TAKE 1 TABLET TWICE DAILY WITH MEALS 180 tablet 1   zolpidem  (AMBIEN ) 5 MG tablet TAKE 1 TABLET BY MOUTH AT BEDTIME AS NEEDED FOR SLEEP. 30 tablet 5   No current facility-administered medications for this visit.    PAST MEDICAL HISTORY: Past Medical History:  Diagnosis Date   BPH (benign prostatic hypertrophy)    Foley catheter in place    GERD (gastroesophageal reflux disease)    watches diet   History of gout    as of 11-12-2016  per pt stable   Hyperlipidemia    Hypertension    per pt stopped his medication losartan  on his own--- last taken 2nd week sept. 2018--- per pt checks bp and has been ok and watches diet   Seasonal allergies    Type 2 diabetes, diet controlled (HCC)    Urinary retention    Vitamin D  deficiency    Wears glasses    Wears partial dentures    upper    PAST SURGICAL HISTORY: Past Surgical History:  Procedure Laterality Date   ANTERIOR CERVICAL DECOMP/DISCECTOMY FUSION  1993   TONSILLECTOMY  child   TRANSURETHRAL RESECTION OF PROSTATE N/A 11/14/2016   Procedure: TRANSURETHRAL RESECTION OF THE PROSTATE (TURP);  Surgeon:  Watt Rush, MD;  Location: [facility];  Service: Urology;  Laterality: N/A;    FAMILY HISTORY: Family History  Problem Relation Age of Onset   ALS Father     SOCIAL HISTORY: Social History   Socioeconomic History   Marital status: Married    Spouse name: Nichole   Number of children: 3   Years of education: Not on file   Highest education level: Not on file  Occupational History   Occupation: retired  Tobacco Use   Smoking status: Former    Current packs/day: 0.00    Average packs/day: 0.5 packs/day for 15.0 years (7.5 ttl pk-yrs)    Types: Cigars, Cigarettes    Start date: 06/16/1951    Quit date: 06/16/1966    Years since quitting: 57.4   Smokeless tobacco: Never  Vaping Use   Vaping status:  Never Used  Substance and Sexual Activity   Alcohol use: Yes    Alcohol/week: 2.0 standard drinks of alcohol    Types: 2 Cans of beer per week    Comment: nightly   Drug use: No   Sexual activity: Not on file  Other Topics Concern   Not on file  Social History Narrative   07/20/2021 - Lives home with wife. Enjoys playing golf. Cuts hay, mows, lots of yard work, very active   Teenage grandsons stay with them a lot of weekends   Right handed   Caffeine-2 cups daily   Social Drivers of Health   Financial Resource Strain: Low Risk  (07/24/2023)   Overall Financial Resource Strain (CARDIA)    Difficulty of Paying Living Expenses: Not hard at all  Food Insecurity: No Food Insecurity (07/24/2023)   Hunger Vital Sign    Worried About Running Out of Food in the Last Year: Never true    Ran Out of Food in the Last Year: Never true  Transportation Needs: No Transportation Needs (07/24/2023)   PRAPARE - Administrator, Civil Service (Medical): No    Lack of Transportation (Non-Medical): No  Physical Activity: Inactive (07/24/2023)   Exercise Vital Sign    Days of Exercise per Week: 0 days    Minutes of Exercise per Session: Not on file  Stress: No Stress Concern  Present (07/24/2023)   [employer] of Occupational Health - Occupational Stress Questionnaire    Feeling of Stress: Not at all  Social Connections: Socially Integrated (07/24/2023)   Social Connection and Isolation Panel    Frequency of Communication with Friends and Family: More than three times a week    Frequency of Social Gatherings with Friends and Family: More than three times a week    Attends Religious Services: More than 4 times per year    Active Member of [employer] or Organizations: Yes    Attends Banker Meetings: More than 4 times per year    Marital Status: Married  Catering manager Violence: Not At Risk (07/24/2023)   Humiliation, Afraid, Rape, and Kick questionnaire    Fear of Current or Ex-Partner: No    Emotionally Abused: No    Physically Abused: No    Sexually Abused: No      Modena Callander, M.D. Ph.D.  [facility] [address] [place], [place] [address] Ph: [phone] Fax: [phone]  CC:  Dettinger, Fonda LABOR, MD [address] [place],  [place] [address]  Dettinger, Fonda LABOR, MD

## 2023-11-04 ENCOUNTER — Other Ambulatory Visit

## 2023-11-04 DIAGNOSIS — R799 Abnormal finding of blood chemistry, unspecified: Secondary | ICD-10-CM | POA: Diagnosis not present

## 2023-11-04 DIAGNOSIS — E538 Deficiency of other specified B group vitamins: Secondary | ICD-10-CM | POA: Diagnosis not present

## 2023-11-04 DIAGNOSIS — R413 Other amnesia: Secondary | ICD-10-CM | POA: Diagnosis not present

## 2023-11-04 DIAGNOSIS — R7989 Other specified abnormal findings of blood chemistry: Secondary | ICD-10-CM | POA: Diagnosis not present

## 2023-11-04 DIAGNOSIS — E079 Disorder of thyroid, unspecified: Secondary | ICD-10-CM | POA: Diagnosis not present

## 2023-11-04 DIAGNOSIS — Z1329 Encounter for screening for other suspected endocrine disorder: Secondary | ICD-10-CM | POA: Diagnosis not present

## 2023-11-05 ENCOUNTER — Telehealth: Payer: Self-pay | Admitting: Neurology

## 2023-11-05 NOTE — Telephone Encounter (Signed)
MRI order sent to [facility] [phone]

## 2023-11-07 LAB — VITAMIN B12: Vitamin B-12: 381 pg/mL (ref 232–1245)

## 2023-11-07 LAB — RPR: RPR Ser Ql: NONREACTIVE

## 2023-11-07 LAB — ATN PROFILE
A -- Beta-amyloid 42/40 Ratio: 0.115 (ref >0.102)
Beta-amyloid 40: 187.8 pg/mL
Beta-amyloid 42: 21.55 pg/mL
N -- NfL, Plasma: 3.64 pg/mL (ref 0.00–9.13)
T -- p-tau181: 1.55 pg/mL — AB (ref 0.00–0.97)

## 2023-11-07 LAB — TSH: TSH: 2.36 u[IU]/mL (ref 0.450–4.500)

## 2023-11-10 ENCOUNTER — Telehealth: Payer: Self-pay | Admitting: Neurology

## 2023-11-10 DIAGNOSIS — R29898 Other symptoms and signs involving the musculoskeletal system: Secondary | ICD-10-CM

## 2023-11-10 NOTE — Telephone Encounter (Signed)
 Called and reviewed results and orders with patient and wife. Both verbalized understanding. Aware someone calling to schedule EMG/NCV

## 2023-11-10 NOTE — Telephone Encounter (Signed)
 Call patient, laboratory evaluation showed:  A high pTau181 concentration was observed. A normal beta-amyloid  42/40 ratio and normal concentration of NfL were observed at this  time.  These results are not consistent with the presence of Alzheimer's-  related pathology,   -- Normal TSH, B12  Ordered EMG nerve conduction study to evaluate his right hand weakness, we will go over findings in detail with him during that visit, if he has any questions  Orders Placed This Encounter  Procedures   NCV with EMG(electromyography)

## 2023-11-25 ENCOUNTER — Ambulatory Visit: Payer: Self-pay

## 2023-11-25 NOTE — Telephone Encounter (Signed)
 Pt has appt

## 2023-11-25 NOTE — Telephone Encounter (Signed)
 Patient reports localized rash to right foot that started a couple of days ago. No pain or itching. Patient is scheduled for an acute visit tomorrow at 1:25 PM   FYI Only or Action Required?: FYI only for provider.  Patient was last seen in primary care on 10/02/2023 by Dettinger, Fonda LABOR, MD.  Called Nurse Triage reporting Rash.  Symptoms began a couple of days ago.  Interventions attempted: Rest, hydration, or home remedies.  Symptoms are: unchanged.  Triage Disposition: See Physician Within 24 Hours  Patient/caregiver understands and will follow disposition?: Yes  Copied from CRM [id number]. Topic: Clinical - Red Word Triage >> Nov 25, 2023 11:47 AM Willma SAUNDERS wrote: Red Word that prompted transfer to Nurse Triage: Patient states on his right foot he has a rash with little red dots, has spread and gotten bigger since yesterday. Reason for Disposition  [1] Looks infected (e.g., spreading redness, pus) AND [2] no fever  Answer Assessment - Initial Assessment Questions 1. APPEARANCE of RASH: What does the rash look like? (e.g., blisters, dry flaky skin, red spots, redness, sores)     Redness with red spots 2. LOCATION: Where is the rash located?      Right foot 3. NUMBER: How many spots are there?      Multiple spot 4. SIZE: How big are the spots? (e.g., inches, cm; or compare to size of pinhead, tip of pen, eraser, pea)      Size of a pencil end 5. ONSET: When did the rash start?      Couple of days 6. ITCHING: Does the rash itch? If Yes, ask: How bad is the itch?  (Scale 0-10; or none, mild, moderate, severe)     no 7. PAIN: Does the rash hurt? If Yes, ask: How bad is the pain?  (Scale 0-10; or none, mild, moderate, severe)     No 8. OTHER SYMPTOMS: Do you have any other symptoms? (e.g., fever)     Some swelling.  Protocols used: Rash or Redness - Localized-A-AH

## 2023-11-26 ENCOUNTER — Ambulatory Visit: Admitting: Family Medicine

## 2023-11-26 ENCOUNTER — Encounter: Payer: Self-pay | Admitting: Family Medicine

## 2023-11-26 VITALS — BP 134/66 | HR 83 | Ht 67.0 in | Wt 185.0 lb

## 2023-11-26 DIAGNOSIS — A6920 Lyme disease, unspecified: Secondary | ICD-10-CM | POA: Diagnosis not present

## 2023-11-26 MED ORDER — DOXYCYCLINE HYCLATE 100 MG PO TABS: 100.0000 mg | ORAL_TABLET | Freq: Two times a day (BID) | ORAL | 0 refills | Status: DC

## 2023-11-26 NOTE — Progress Notes (Signed)
 BP 134/66   Pulse 83   Ht 5' 7 (1.702 m)   Wt 185 lb (83.9 kg)   SpO2 97%   BMI 28.98 kg/m    Subjective:   Patient ID: Terry Winters, male    DOB: May 01, 1934, 89 y.o.   MRN: [id number]  HPI: Terry Winters is a 89 y.o. male presenting on 11/26/2023 for No chief complaint on file.   Discussed the use of AI scribe software for clinical note transcription with the patient, who gave verbal consent to proceed.  History of Present Illness   Terry Winters is an 89 year old male who presents with a rash on his right foot.  Right foot rash - Onset approximately five days ago - Initially appeared as a round spot on the top of the right foot - Suspected etiology includes possible tick or spider bite due [place] exposure - Rash has spread to involve the toes over the past few days - Lesion characterized by a small central spot with a surrounding ring - No associated pruritus, pain, or discomfort - No use of prescribed antifungal medication to date  Constitutional symptoms - No fever, chills, or body aches  Medication intolerance - Adverse reaction to prednisone, unable to tolerate even small doses          Relevant past medical, surgical, family and social history reviewed and updated as indicated. Interim medical history since our last visit reviewed. Allergies and medications reviewed and updated.  Review of Systems  Constitutional:  Negative for chills and fever.  Respiratory:  Negative for shortness of breath and wheezing.   Cardiovascular:  Negative for chest pain and leg swelling.  Musculoskeletal:  Negative for back pain and gait problem.  Skin:  Positive for color change and rash.  All other systems reviewed and are negative.   Per HPI unless specifically indicated above   Allergies as of 11/26/2023       Reactions   Amoxicillin  Rash   Prednisone Rash   Sulfa Antibiotics Other (See Comments)   upset stomach   Triamcinolone  Rash         Medication List        Accurate as of November 26, 2023  1:50 PM. If you have any questions, ask your nurse or doctor.          ALPRAZolam  0.5 MG tablet Commonly known as: XANAX  Take 1-2 tablets 30 minutes prior to MRI, may repeat once as needed. Must have driver.   amLODipine  2.5 MG tablet Commonly known as: NORVASC  TAKE 1 TABLET BY MOUTH EVERY DAY   atorvastatin  20 MG tablet Commonly known as: LIPITOR TAKE 1 TABLET EVERY DAY AT 6 PM   doxycycline  100 MG tablet Commonly known as: VIBRA -TABS Take 1 tablet (100 mg total) by mouth 2 (two) times daily. 1 po bid Started by: Fonda LABOR Ryden Wainer   finasteride  5 MG tablet Commonly known as: PROSCAR  TAKE 1 TABLET BY MOUTH EVERY DAY IN THE MORNING   fluticasone  50 MCG/ACT nasal spray Commonly known as: FLONASE  Place 2 sprays into both nostrils as needed for allergies or rhinitis.   naproxen  375 MG tablet Commonly known as: NAPROSYN  TAKE 1 TABLET TWICE DAILY WITH MEALS   ZINC PO Take 1 tablet by mouth once a week.   zolpidem  5 MG tablet Commonly known as: AMBIEN  TAKE 1 TABLET BY MOUTH AT BEDTIME AS NEEDED FOR SLEEP.         Objective:   BP 134/66  Pulse 83   Ht 5' 7 (1.702 m)   Wt 185 lb (83.9 kg)   SpO2 97%   BMI 28.98 kg/m   Wt Readings from Last 3 Encounters:  11/26/23 185 lb (83.9 kg)  11/03/23 182 lb (82.6 kg)  10/02/23 179 lb (81.2 kg)    Physical Exam Vitals and nursing note reviewed.  Constitutional:      General: He is not in acute distress.    Appearance: He is well-developed. He is not diaphoretic.  Musculoskeletal:        General: No swelling.  Skin:    General: Skin is warm and dry.     Findings: Rash present.  Neurological:     Mental Status: He is alert and oriented to person, place, and time.     Coordination: Coordination normal.  Psychiatric:        Behavior: Behavior normal.    Physical Exam   SKIN: Central eschar on the top of the right foot with surrounding pink macules  and nonblanching pink papules.         Assessment & Plan:   Problem List Items Addressed This Visit   None Visit Diagnoses       Lyme disease    -  Primary   Relevant Medications   doxycycline  (VIBRA -TABS) 100 MG tablet         Rash of right foot, possible tick bite (Lyme disease suspected) Rash with central eschar and surrounding pink macules. Possible tick bite with Lyme disease suspected. No associated symptoms. - Prescribed doxycycline  100 mg BID for 14 days. - Advised taking doxycycline  with food, specifically with breakfast and dinner. - Sent prescription to [facility].          Follow up plan: Return if symptoms worsen or fail to improve.  Counseling provided for all of the vaccine components No orders of the defined types were placed in this encounter.   Fonda Levins, MD [facility] 11/26/2023, 1:50 PM

## 2023-12-02 ENCOUNTER — Other Ambulatory Visit: Payer: Self-pay | Admitting: Family Medicine

## 2023-12-11 DIAGNOSIS — M79675 Pain in left toe(s): Secondary | ICD-10-CM | POA: Diagnosis not present

## 2023-12-11 DIAGNOSIS — M79674 Pain in right toe(s): Secondary | ICD-10-CM | POA: Diagnosis not present

## 2023-12-11 DIAGNOSIS — B351 Tinea unguium: Secondary | ICD-10-CM | POA: Diagnosis not present

## 2023-12-31 ENCOUNTER — Encounter: Payer: Self-pay | Admitting: Neurology

## 2023-12-31 ENCOUNTER — Ambulatory Visit: Admitting: Neurology

## 2023-12-31 VITALS — BP 168/80 | Resp 16 | Ht 67.5 in

## 2023-12-31 DIAGNOSIS — R29898 Other symptoms and signs involving the musculoskeletal system: Secondary | ICD-10-CM

## 2023-12-31 DIAGNOSIS — R413 Other amnesia: Secondary | ICD-10-CM | POA: Diagnosis not present

## 2023-12-31 NOTE — Procedures (Signed)
 Full Name: Terry Winters Gender: Male MRN #: [id number] Date of Birth: November 16, 1934    Visit Date: 12/31/2023 09:02 Age: 89 Years Examining Physician: Onita Duos Referring Physician: Onita Duos Height: 5 feet 7 inch History: 89 year old male with weakness of right hand  Summary of the test:   Nerve conduction study: Left median sensory response was absent, right median sensory response showed significance significantly prolonged distal latency,  with mildly decreased snap amplitude.  Bilateral ulnar sensory responses showed mildly prolonged peak latency with mildly decreased snap amplitude.  Right radial sensory responses were within normal limit.  Left radial sensory response showed mildly decreased snap amplitude.  Bilateral ulnar motor responses showed no significant abnormalities.  Bilateral median motor responses showed prolonged distal latency, right side is mild, left side is moderate also with decreased CMAP amplitude.  Electromyography: Selected needle examinations were performed at bilateral upper extremity muscles  There were evidence of chronic neuropathic changes involving extensor digitorum commonness, extensor carpi radialis longus, and extensor carpi radialis brevis.   Conclusion: This is an abnormal study.  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    Duos Onita. M.D. Ph.D.   [facility] [address], [address] [place], [place] [address] Tel: [phone] Fax: [phone]  Verbal informed consent was obtained from the patient, patient was informed of potential risk of procedure, including bruising, bleeding, hematoma formation, infection, muscle weakness, muscle pain, numbness, among others.        MNC    Nerve / Sites Muscle Latency Ref.  Amplitude Ref. Rel Amp Segments Distance Velocity Ref. Area    ms ms mV mV %  cm m/s m/s mVms  R Median - APB     Wrist APB 4.8 <=4.4 4.7 >=4.0 100 Wrist - APB 7   16.5     Upper arm APB 10.2  2.1  44.8 Upper arm - Wrist 25 46 >=49 7.2  L Median - APB     Wrist APB 6.6 <=4.4 1.5 >=4.0 100 Wrist - APB 7   6.7     Upper arm APB 9.8  2.5  171 Upper arm - Wrist 22 69 >=49 10.0  R Ulnar - ADM     Wrist ADM 3.8 <=3.3 8.0 >=6.0 100 Wrist - ADM 7   16.0     B.Elbow ADM 6.8  7.3  91.8 B.Elbow - Wrist 18 60 >=49 15.7     A.Elbow ADM 8.5  6.1  83 A.Elbow - B.Elbow 10 59 >=49 14.9  L Ulnar - ADM     Wrist ADM 2.9 <=3.3 9.1 >=6.0 100 Wrist - ADM 7   21.6     B.Elbow ADM 5.2  8.6  93.6 B.Elbow - Wrist 14 61 >=49 21.5     A.Elbow ADM 7.0  7.8  91.6 A.Elbow - B.Elbow 11 61 >=49 20.3             SNC    Nerve / Sites Rec. Site Peak Lat Ref.  Amp Ref. Segments Distance    ms ms V V  cm  R Radial - Anatomical snuff box (Forearm)     Forearm Wrist 2.5 <=2.9 18 >=15 Forearm - Wrist 10  L Radial - Anatomical snuff box (Forearm)     Forearm Wrist 2.1 <=2.9 8 >=15 Forearm - Wrist 10  R Median -  Orthodromic (Dig II, Mid palm)     Dig II Wrist 5.2 <=3.4 3 >=10 Dig II - Wrist 13  L Median - Orthodromic (Dig II, Mid palm)     Dig II Wrist NR <=3.4 NR >=10 Dig II - Wrist 13  R Ulnar - Orthodromic, (Dig V, Mid palm)     Dig V Wrist 3.5 <=3.1 4 >=5 Dig V - Wrist 11  L Ulnar - Orthodromic, (Dig V, Mid palm)     Dig V Wrist 3.2 <=3.1 3 >=5 Dig V - Wrist 71                 F  Wave    Nerve F Lat Ref.   ms ms  R Ulnar - ADM 31.0 <=32.0  L Ulnar - ADM 27.9 <=32.0         EMG Summary Table    Spontaneous MUAP Recruitment  Muscle IA Fib PSW Fasc Other Amp Dur. Poly Pattern  R. First dorsal interosseous Normal None None None _______ Normal Normal Normal Normal  R. Pronator teres Normal None None None _______ Normal Normal Normal Normal  R. Triceps brachii Normal None None None _______ Normal Normal Normal  Normal  R. Deltoid Normal None None None _______ Normal Normal Normal Normal  R. Biceps brachii Normal None None None _______ Normal Normal Normal Normal  R. Extensor digitorum communis Increased None None None _______ Increased Increased 1+ Reduced  R. Extensor carpi radialis brevis Increased None None None _______ Normal Normal Normal Reduced  R. Extensor carpi radialis longus Normal None None None _______ Normal Normal Normal Reduced  L. First dorsal interosseous Normal None None None _______ Normal Normal Normal Normal  L. Pronator teres Normal None None None _______ Normal Normal Normal Normal  L. Triceps brachii Normal None None None _______ Normal Normal Normal Normal  L. Thoracic paraspinals Normal None None None _______ Normal Normal Normal Normal  L. Deltoid Normal None None None _______ Normal Normal Normal Normal  L. Biceps brachii Normal None None None _______ Normal Normal Normal Normal

## 2023-12-31 NOTE — Progress Notes (Signed)
 Chief Complaint  Patient presents with   NERVE CONDUCTION STUDY    Terry Winters, wife present, NERVE CONDUCTION STUDY: pt is well and ready for ncs/emg   ASSESSMENT AND PLAN  Terry Winters is a 89 y.o. male   Slow Worsening memory loss  MRI of brain showed generalized atrophy, small vessel disease showed  Laboratory evaluation showed no treatable etiology  ATN profile was negative for beta amyloid 42:40 ratio, not enough evidence to Alzheimer's disease, likely early stage central nervous system disorder, vascular component might contributed to  He does not want to start medication treatment,  Right wrist drop  Started since January 2025, persistent, also has mild to moderate right hand intrinsic muscle atrophy,    EMG nerve conduction study suggestive of chronic mild right posterior interosseous nerve neuropathy, overall improving,  Will continue follow-up with primary care physician return to clinic for new issues DIAGNOSTIC DATA (LABS, IMAGING, TESTING) - I reviewed patient records, labs, notes, testing and imaging myself where available.   MEDICAL HISTORY:  Terry Winters is a 89 year old male, accompanied by his wife, seen in request by his primary care from [place] Dr. Maryanne, Fonda for evaluation of memory loss, initial evaluation was on November 03, 2023  History is obtained from the patient and review of electronic medical records. I personally reviewed pertinent available imaging films in PACS.   PMHx of HTN HLD Prostate Hypertrophy  DM Cervical decompression  He was a agricultural engineer, retired since 2005, enjoying yard work, remain active at home, eating well, sleeping well, still drives short distance without loss, able to keep his broken balance,  Over the last couple years, he noted to have mild memory loss, keep the lid of the jar open, forgot to close the cabinet, MoCA examination 23/30, missed 5 out of 5 recall, her mother suffered dementia in her  old age  He used to  enjoying playing golf, fell in January 2025, since then, complains of knee pain, mild gait abnormality,  He was not able to give a clear timeline, reported even before his fall in January, he has developed right hand weakness, difficulty extent his right wrist and fingers, he contributed to his joints pain, but there was no significant recovery over the past 1 year despite better control of his joint pain  UPDATE Dec 31 2023: He is accompanied by his wife for electrodiagnostic study for evaluation of his right arm weakness, there is evidence of chronic right posterior interosseous nerve neuropathy, with well-preserved right radial sensory response.  He reported mild improvement over the past few months, but still has mild difficulty raising up right wrist  [facility] January 2025, no acute abnormality, chronic small vessel disease, mild generalized atrophy  Laboratory evaluation for potential treatable etiology of his memory complaints including normal or negative RPR, B12 TSH, CMP, CBC   ATN profile was negative for beta amyloid 42:40 ratio, with mild elevation of P tau 181   PHYSICAL EXAM:   Vitals:   12/31/23 0803 12/31/23 0808  BP: (!) 172/84 (!) 168/80  Resp: 16   Height: 5' 7.5 (1.715 m)      Body mass index is 28.55 kg/m.  PHYSICAL EXAMNIATION:  Gen: NAD, conversant, well nourised, well groomed                     Cardiovascular: Regular rate rhythm, no peripheral edema, warm, nontender. Eyes: Conjunctivae clear without exudates or hemorrhage Neck: Supple, no  carotid bruits. Pulmonary: Clear to auscultation bilaterally   NEUROLOGICAL EXAM:  MENTAL STATUS: Speech/cognition: Awake, alert, oriented to history taking and casual conversation    11/03/2023    2:20 PM  Montreal Cognitive Assessment   Visuospatial/ Executive (0/5) 3  Naming (0/3) 3  Attention: Read list of digits (0/2) 2  Attention: Read list of letters (0/1) 1   Attention: Serial 7 subtraction starting at 100 (0/3) 3  Language: Repeat phrase (0/2) 2  Language : Fluency (0/1) 1  Abstraction (0/2) 2  Delayed Recall (0/5) 0  Orientation (0/6) 6  Total 23    CRANIAL NERVES: CN II: Visual fields are full to confrontation. Pupils are round equal and briskly reactive to light. CN III, IV, VI: extraocular movement are normal. No ptosis. CN V: Facial sensation is intact to light touch CN VII: Face is symmetric with normal eye closure  CN VIII: Mild hearing loss CN IX, X: Phonation is normal. CN XI: Head turning and shoulder shrug are intact  MOTOR: mild right hand intrinsic hand atrophy, mild finger abduction, mild right wrist and right finger extension weakness  REFLEXES: Reflexes are 1 and symmetric at the biceps, triceps, knees, and absent ankles. Plantar responses are flexor.  SENSORY: Length-dependent decreased light touch pinprick vibratory sensation to distal shin level  COORDINATION: There is no trunk or limb dysmetria noted.  GAIT/STANCE: Push-up, cautious mildly antalgic  REVIEW OF SYSTEMS:  Full 14 system review of systems performed and notable only for as above All other review of systems were negative.   ALLERGIES: Allergies  Allergen Reactions   Amoxicillin  Rash   Prednisone Rash   Sulfa Antibiotics Other (See Comments)    upset stomach   Triamcinolone  Rash    HOME MEDICATIONS: Current Outpatient Medications  Medication Sig Dispense Refill   amLODipine  (NORVASC ) 2.5 MG tablet TAKE 1 TABLET BY MOUTH EVERY DAY 90 tablet 1   atorvastatin  (LIPITOR) 20 MG tablet TAKE 1 TABLET EVERY DAY AT 6 PM 90 tablet 3   finasteride  (PROSCAR ) 5 MG tablet TAKE 1 TABLET BY MOUTH EVERY DAY IN THE MORNING 90 tablet 1   fluticasone  (FLONASE ) 50 MCG/ACT nasal spray Place 2 sprays into both nostrils as needed for allergies or rhinitis. 48 mL 1   Multiple Vitamins-Minerals (ZINC PO) Take 1 tablet by mouth once a week.      naproxen   (NAPROSYN ) 375 MG tablet TAKE 1 TABLET TWICE DAILY WITH MEALS 180 tablet 1   zolpidem  (AMBIEN ) 5 MG tablet TAKE 1 TABLET BY MOUTH AT BEDTIME AS NEEDED FOR SLEEP. 30 tablet 5   No current facility-administered medications for this visit.    PAST MEDICAL HISTORY: Past Medical History:  Diagnosis Date   BPH (benign prostatic hypertrophy)    Foley catheter in place    GERD (gastroesophageal reflux disease)    watches diet   History of gout    as of 11-12-2016  per pt stable   Hyperlipidemia    Hypertension    per pt stopped his medication losartan  on his own--- last taken 2nd week sept. 2018--- per pt checks bp and has been ok and watches diet   Seasonal allergies    Type 2 diabetes, diet controlled (HCC)    Urinary retention    Vitamin D  deficiency    Wears glasses    Wears partial dentures    upper    PAST SURGICAL HISTORY: Past Surgical History:  Procedure Laterality Date   ANTERIOR CERVICAL DECOMP/DISCECTOMY FUSION  1993  TONSILLECTOMY  child   TRANSURETHRAL RESECTION OF PROSTATE N/A 11/14/2016   Procedure: TRANSURETHRAL RESECTION OF THE PROSTATE (TURP);  Surgeon: Watt Rush, MD;  [facility];  Service: Urology;  Laterality: N/A;    FAMILY HISTORY: Family History  Problem Relation Age of Onset   ALS Father     SOCIAL HISTORY: Social History   Socioeconomic History   Marital status: Married    Spouse name: Nichole   Number of children: 3   Years of education: Not on file   Highest education level: Not on file  Occupational History   Occupation: retired  Tobacco Use   Smoking status: Former    Current packs/day: 0.00    Average packs/day: 0.5 packs/day for 15.0 years (7.5 ttl pk-yrs)    Types: Cigars, Cigarettes    Start date: 06/16/1951    Quit date: 06/16/1966    Years since quitting: 57.5   Smokeless tobacco: Never  Vaping Use   Vaping status: Never Used  Substance and Sexual Activity   Alcohol use: Yes    Alcohol/week: 2.0  standard drinks of alcohol    Types: 2 Cans of beer per week    Comment: nightly   Drug use: No   Sexual activity: Not on file  Other Topics Concern   Not on file  Social History Narrative   07/20/2021 - Lives home with wife. Enjoys playing golf. Cuts hay, mows, lots of yard work, very active   Teenage grandsons stay with them a lot of weekends   Right handed   Caffeine-2 cups daily   Social Drivers of Health   Financial Resource Strain: Low Risk  (07/24/2023)   Overall Financial Resource Strain (CARDIA)    Difficulty of Paying Living Expenses: Not hard at all  Food Insecurity: No Food Insecurity (07/24/2023)   Hunger Vital Sign    Worried About Running Out of Food in the Last Year: Never true    Ran Out of Food in the Last Year: Never true  Transportation Needs: No Transportation Needs (07/24/2023)   PRAPARE - Administrator, Civil Service (Medical): No    Lack of Transportation (Non-Medical): No  Physical Activity: Inactive (07/24/2023)   Exercise Vital Sign    Days of Exercise per Week: 0 days    Minutes of Exercise per Session: Not on file  Stress: No Stress Concern Present (07/24/2023)   [employer] of Occupational Health - Occupational Stress Questionnaire    Feeling of Stress: Not at all  Social Connections: Socially Integrated (07/24/2023)   Social Connection and Isolation Panel    Frequency of Communication with Friends and Family: More than three times a week    Frequency of Social Gatherings with Friends and Family: More than three times a week    Attends Religious Services: More than 4 times per year    Active Member of [employer] or Organizations: Yes    Attends Banker Meetings: More than 4 times per year    Marital Status: Married  Catering Manager Violence: Not At Risk (07/24/2023)   Humiliation, Afraid, Rape, and Kick questionnaire    Fear of Current or Ex-Partner: No    Emotionally Abused: No    Physically Abused: No    Sexually Abused:  No      Modena Callander, M.D. Ph.D.  [facility] [address], [address] [place], [place] [address] Ph: [phone] Fax: [phone]  CC:  Dettinger, Fonda LABOR, MD [address]  [place],  [place] [address]  Dettinger, Fonda LABOR, MD

## 2024-02-13 ENCOUNTER — Telehealth: Payer: Self-pay | Admitting: Family Medicine

## 2024-02-13 DIAGNOSIS — G8929 Other chronic pain: Secondary | ICD-10-CM

## 2024-02-13 MED ORDER — AMLODIPINE BESYLATE 2.5 MG PO TABS: 2.5000 mg | ORAL_TABLET | Freq: Every day | ORAL | 0 refills | Status: AC

## 2024-02-13 MED ORDER — NAPROXEN 375 MG PO TABS: 375.0000 mg | ORAL_TABLET | Freq: Two times a day (BID) | ORAL | 0 refills | Status: AC

## 2024-02-13 NOTE — Telephone Encounter (Signed)
 Okay to refill? Last regular visit with PCP was in August 2025 and per those OV notes, PCP wanted to see patient back in 6 months (around 04/03/2024) for next visit. Patient is scheduled to be seen again on 04/02/2024.

## 2024-02-13 NOTE — Telephone Encounter (Signed)
 Yes go ahead and give him a 90-day refill on both amlodipine  and naproxen 

## 2024-02-13 NOTE — Telephone Encounter (Signed)
Refills sent to [facility] in [facility]

## 2024-02-13 NOTE — Telephone Encounter (Signed)
" °  Prescription Request  02/13/2024  Is this a Controlled Substance medicine? no  Have you seen your PCP in the last 2 weeks? no  If YES, route message to pool  -  If NO, patient needs to be scheduled for appointment.  What is the name of the medication or equipment? Amlodipine  2.5 mg and Naproxen  375 mg  Have you contacted your pharmacy to request a refill? No   Which pharmacy would you like this sent to? [facility]. Patient is switching from [facility] to [facility].   Patient notified that their request is being sent to the clinical staff for review and that they should receive a response within 2 business days.    "

## 2024-03-01 ENCOUNTER — Other Ambulatory Visit: Payer: Self-pay | Admitting: Family Medicine

## 2024-04-02 ENCOUNTER — Ambulatory Visit: Payer: Self-pay | Admitting: Family Medicine

## 2024-07-26 ENCOUNTER — Ambulatory Visit

## 2024-07-27 ENCOUNTER — Ambulatory Visit: Payer: Self-pay
